# Patient Record
Sex: Female | Born: 1975
Health system: Southern US, Community
[De-identification: ages and names within clinical notes are randomized; demographics above are authoritative.]

## PROBLEM LIST (undated history)

## (undated) DIAGNOSIS — Z87442 Personal history of urinary calculi: Secondary | ICD-10-CM

## (undated) DIAGNOSIS — F4310 Post-traumatic stress disorder, unspecified: Secondary | ICD-10-CM

## (undated) DIAGNOSIS — G589 Mononeuropathy, unspecified: Secondary | ICD-10-CM

## (undated) DIAGNOSIS — F329 Major depressive disorder, single episode, unspecified: Secondary | ICD-10-CM

## (undated) DIAGNOSIS — F32A Depression, unspecified: Secondary | ICD-10-CM

## (undated) DIAGNOSIS — K219 Gastro-esophageal reflux disease without esophagitis: Secondary | ICD-10-CM

## (undated) DIAGNOSIS — M25559 Pain in unspecified hip: Secondary | ICD-10-CM

## (undated) DIAGNOSIS — M549 Dorsalgia, unspecified: Secondary | ICD-10-CM

## (undated) DIAGNOSIS — G8929 Other chronic pain: Secondary | ICD-10-CM

## (undated) DIAGNOSIS — F419 Anxiety disorder, unspecified: Secondary | ICD-10-CM

## (undated) HISTORY — DX: Gastro-esophageal reflux disease without esophagitis: K21.9

## (undated) HISTORY — DX: Pain in unspecified hip: M25.559

## (undated) HISTORY — PX: FOOT SURGERY: SHX648

## (undated) HISTORY — PX: MOUTH SURGERY: SHX715

## (undated) HISTORY — DX: Other chronic pain: G89.29

## (undated) HISTORY — PX: DILATION AND CURETTAGE OF UTERUS: SHX78

## (undated) HISTORY — DX: Anxiety disorder, unspecified: F41.9

## (undated) HISTORY — DX: Dorsalgia, unspecified: M54.9

---

## 1999-01-30 HISTORY — PX: FOOT SURGERY: SHX648

## 2002-01-05 ENCOUNTER — Ambulatory Visit (HOSPITAL_COMMUNITY): Admission: RE | Admit: 2002-01-05 | Discharge: 2002-01-05 | Payer: Self-pay | Admitting: Obstetrics and Gynecology

## 2002-01-05 ENCOUNTER — Encounter: Payer: Self-pay | Admitting: Obstetrics and Gynecology

## 2002-01-29 HISTORY — PX: DILATION AND CURETTAGE OF UTERUS: SHX78

## 2002-10-07 ENCOUNTER — Other Ambulatory Visit: Admission: RE | Admit: 2002-10-07 | Discharge: 2002-10-07 | Payer: Self-pay | Admitting: Obstetrics and Gynecology

## 2003-03-25 ENCOUNTER — Ambulatory Visit (HOSPITAL_COMMUNITY): Admission: RE | Admit: 2003-03-25 | Discharge: 2003-03-25 | Payer: Self-pay | Admitting: Obstetrics and Gynecology

## 2003-04-01 ENCOUNTER — Inpatient Hospital Stay (HOSPITAL_COMMUNITY): Admission: AD | Admit: 2003-04-01 | Discharge: 2003-04-04 | Payer: Self-pay | Admitting: Obstetrics and Gynecology

## 2003-04-02 ENCOUNTER — Encounter (INDEPENDENT_AMBULATORY_CARE_PROVIDER_SITE_OTHER): Payer: Self-pay | Admitting: Specialist

## 2003-04-05 ENCOUNTER — Encounter: Admission: RE | Admit: 2003-04-05 | Discharge: 2003-05-05 | Payer: Self-pay | Admitting: Obstetrics and Gynecology

## 2003-05-06 ENCOUNTER — Encounter: Admission: RE | Admit: 2003-05-06 | Discharge: 2003-06-05 | Payer: Self-pay | Admitting: Obstetrics and Gynecology

## 2003-07-22 ENCOUNTER — Other Ambulatory Visit: Admission: RE | Admit: 2003-07-22 | Discharge: 2003-07-22 | Payer: Self-pay | Admitting: Obstetrics and Gynecology

## 2004-07-25 ENCOUNTER — Other Ambulatory Visit: Admission: RE | Admit: 2004-07-25 | Discharge: 2004-07-25 | Payer: Self-pay | Admitting: Obstetrics and Gynecology

## 2005-01-30 ENCOUNTER — Inpatient Hospital Stay (HOSPITAL_COMMUNITY): Admission: AD | Admit: 2005-01-30 | Discharge: 2005-01-31 | Payer: Self-pay | Admitting: Obstetrics and Gynecology

## 2005-09-03 ENCOUNTER — Other Ambulatory Visit: Admission: RE | Admit: 2005-09-03 | Discharge: 2005-09-03 | Payer: Self-pay | Admitting: Obstetrics and Gynecology

## 2006-08-19 ENCOUNTER — Ambulatory Visit (HOSPITAL_COMMUNITY): Admission: RE | Admit: 2006-08-19 | Discharge: 2006-08-19 | Payer: Self-pay | Admitting: Obstetrics and Gynecology

## 2007-04-08 ENCOUNTER — Inpatient Hospital Stay (HOSPITAL_COMMUNITY): Admission: AD | Admit: 2007-04-08 | Discharge: 2007-04-09 | Payer: Self-pay | Admitting: Obstetrics and Gynecology

## 2010-06-16 NOTE — Discharge Summary (Signed)
[name], [name]              ACCOUNT NO.:  [id number]   MEDICAL RECORD NO.:  [id number]          PATIENT TYPE:  INP   LOCATION:  9121                          FACILITY:  [facility]   PHYSICIAN:  Malachi Pro. Ambrose Mantle, M.D. DATE OF BIRTH:  1975/03/23   DATE OF ADMISSION:  01/30/2005  DATE OF DISCHARGE:  01/31/2005                                 DISCHARGE SUMMARY   A 35 year old white married female, para 1-0-2-1, gravida 4, [facility] February 15, 2005 admitted ready for delivery.  Blood group and type A positive, negative  antibody, nonreactive serology, rubella immune, hepatitis B surface antigen  negative, HIV declined, GC and chlamydia negative, triple screen declined,  one-hour Glucola 109.  The patient had a vaginal ultrasound on July 13, 2004.  A crown-rump length of 2.33 cm, 9 weeks, zero days.  [facility] February 15, 2005.  She took Effexor, Lexapro and Zofran in early pregnancy because of a  history of IUGR.  Ultrasound was done on January 03, 2005.  Estimated fetal  weight 21st percentile, normal amniotic fluid.  The patient came to  Maternity Admission Unit fully dilated.  The nurse called the nurse midwife  to attend.  I was called and arrived in no more than nine minutes.  The baby  had been delivered and the placenta was out.  The patient had no known  allergies.   OPERATIONS:  In 2001 bilateral bunionectomies, 2003 and 1997 D&C.   ILLNESSES:  Anxiety, low-grade SIL 2004.  Had cryotherapy.   FAMILY HISTORY:  Maternal grandfather with high blood pressure and MI.   OBSTETRIC HISTORY:  In 1997 and 2003 spontaneously abortions, March of 2005  a 4 pound 15 ounce female at 37 weeks.  Delivery was vaginally with vacuum  assistance.   On admission, the patient's vital signs were normal.  Heart and lungs were  normal.  The abdomen showed a recently postpartum uterus.  Pelvic was  deferred because the patient had just been delivered by the certified nurse  midwife.  The patient delivered LOA  female infant with Apgars of 8 at one  and 9 at five minutes, weight of 6 pounds 1 ounce.  Blood loss less than 250  mL estimated by the nurse midwife.  Postpartum, the patient did well and was  discharged on the first postpartum day.  Her hemoglobin on admission was  12.9, hematocrit 37.1, white count 15,700, platelet count 285,000.  Follow-  up hemoglobin 12.2.  RPR was nonreactive.   FINAL DIAGNOSES:  1.  Intrauterine pregnancy at 37+ weeks.  Delivered left occiput anterior in      the Maternity Admission Unit.  2.  Operations, spontaneously delivery, left occiput anterior.   FINAL CONDITION:  Improved.   INSTRUCTIONS:  Include our regular discharge instruction booklet.  Patient  is given Celexa 20 mg to take one a day, and return to the office in six  weeks for follow-up examination.      Malachi Pro. Ambrose Mantle, M.D.  Electronically Signed     TFH/MEDQ  D:  01/31/2005  T:  01/31/2005  Job:  [id number] 

## 2010-06-16 NOTE — Discharge Summary (Signed)
[name], [name]              ACCOUNT NO.:  [id number]   MEDICAL RECORD NO.:  [id number]          PATIENT TYPE:  INP   LOCATION:  9146                          [facility]ACILITY:  [facility]   PHYSICIAN:  Zenaida Niece, M.D.DATE OF BIRTH:  1975-07-30   DATE OF ADMISSION:  04/08/2007  DATE OF DISCHARGE:  04/09/2007                               DISCHARGE SUMMARY   ADMISSION DIAGNOSIS:  Intrauterine pregnancy at 38 weeks.   DISCHARGE DIAGNOSIS:  Intrauterine pregnancy at 38 weeks.   PROCEDURES:  On March 10, she had a spontaneous vaginal delivery.   HISTORY AND PHYSICAL:  This is a 35 year old white female gravida 5,  para 2-0-2-2 with an EGA of 38+ weeks who presents with complaint of  regular contractions.  Evaluation in triage revealed regular  contractions and she was 6 cm dilated.  Prenatal care essentially  uncomplicated except that she was started on Celexa for anxiety.  Prenatal labs blood type is A+ with negative antibody screen, RPR  nonreactive, rubella immune, hepatitis B surface antigen negative, HIV  negative, gonorrhea and chlamydia negative, 1-hour Glucola 137, and  group B strep is negative.   PAST OBSTETRICAL HISTORY:  Two spontaneous abortions and then in 2005  vaginal delivery at 37 weeks, 4 pounds 15 ounces with intrauterine  growth restriction, and 2007 precipitous vaginal delivery at 37 weeks, 6  pounds 1 ounce.   GYN HISTORY:  Cryotherapy in 1997 and colposcopy in 2005.   PAST MEDICAL HISTORY:  Anxiety.   SURGICAL HISTORY:  Bunions and D and C.   MEDICATIONS:  Celexa.   PHYSICAL EXAMINATION:  GENERAL:  She is afebrile with stable vital  signs.  HEART:  Fetal heart tracing is reassuring with some variable  decelerations with contractions every 2-3 minutes.  ABDOMEN:  Gravid, nontender with an estimated fetal weight of 7-1/2  pounds.  PELVIC:  Cervix is 7-8, 90, -2, vertex presentation, and membranes  rupture revealing clear fluid.   HOSPITAL COURSE:  The  patient was admitted and continued to contract on  her own.  Membranes were ruptured and she continued to progress.  On the  morning of March 10, she had a vaginal delivery of a viable female infant  with Apgars of 7 and 9 that weighed 7 pounds 2 ounces.  A loose nuchal  cord x1 was reduced.  Placenta delivered spontaneous was intact.  She  had a first-degree vaginal and periurethral/periclitoral lacerations  repaired with 3-0 Vicryl with local block.  Estimated blood loss was  less than 500 mL.  Her IV came out during delivery, so she was given IM  Pitocin.  Postpartum, she had no significant complications.  On  postpartum day #1, she requested discharge home.  This was approved  since the baby could go home and she was discharged home.   DISCHARGE INSTRUCTIONS:  Regular diet, pelvic rest, and follow up in 6  weeks.   MEDICATIONS:  Over-the-counter ibuprofen as needed.  She is also to  continue her Celexa 20 mg.  She has been taking a quarter of one, I have  recommended that she take  at least half of one every day, and she is  given our discharge pamphlet.      Zenaida Niece, M.D.  Electronically Signed     TDM/MEDQ  D:  05/04/2007  T:  05/04/2007  Job:  [id number]

## 2010-06-16 NOTE — Discharge Summary (Signed)
[name], [name]                          ACCOUNT NO.:  [id number]   MEDICAL RECORD NO.:  [id number]                   PATIENT TYPE:  INP   LOCATION:  9145                                 [facility]ILITY[facility]  [facility]   PHYSICIAN:  Huel Cote, M.D.              DATE OF BIRTH:  05-18-1975   DATE OF ADMISSION:  04/01/2003  DATE OF DISCHARGE:  04/04/2003                                 DISCHARGE SUMMARY   DISCHARGE DIAGNOSES:  1. Term pregnancy at 37-3/7 weeks.  2. Intrauterine growth restriction.  3. Status post normal spontaneous vaginal delivery.  4. Prenatal care complicated by anxiety and mild depression.   DISCHARGE MEDICATIONS:  1. Motrin 600 mg p.o. every 6 hours p.r.n.  2. Percocet 1 to 2 tablets p.o. every 4 hours p.r.n.   HISTORY OF PRESENT ILLNESS:  The patient is a 35 year old G3, P0-0-2-0 who  is admitted at 37-3/7 weeks for induction of labor given an IUGR fetus noted  by ultrasound. The patient was found to be size less than dates on March 22, 2003, and followup ultrasound revealed an estimated fetal weight that  was in the 4th to 10th percentile. AFI and Doppler's were normal. The  patient had been followed by twice weekly NST's given that her cervix was  unfavorable. Prenatal course was also complicated by significant anxiety and  depression. The patient was on Effexor and was changed to Zoloft after she  had nausea and vomiting and restarted just prior to delivery. The patient  still had an essentially unfavorable cervix. However, due to her anxiety and  panic attacks over the welfare of the baby, desired trial of induction with  cervical ripening.   PRENATAL LABORATORY DATA:  A+, antibody negative, RPR non-reactive, Rubella  immune, hepatitis B surface antigen negative, HIV declined, GC negative,  Chlamydia negative, triple screen negative, group B strep negative.   PAST OBSTETRIC HISTORY:  The patient had two spontaneous miscarriages.   PAST GYNECOLOGIC  HISTORY:  Low-grade pap smear with negative colposcopy and  a repeat pap due postpartum.   PAST MEDICAL HISTORY:  Anxiety as stated.   PAST SURGICAL HISTORY:  Bunionectomy and a D&C x2.   ALLERGIES:  None.   MEDICATIONS:  Zoloft 50 mg p.o. q.h.s.   PHYSICAL EXAMINATION:  VITAL SIGNS:  On admission, she was afebrile with  stable vital signs. Fetal heart rate was reactive.  CARDIAC:  Regular rate and rhythm.  LUNGS:  Clear.  ABDOMEN:  Soft and nontender. Fundal height is 32 cm.  GENITALIA:  Pelvic was 50, closed, and a -3.   HOSPITAL COURSE:  The patient was admitted and placed on Cervidil ripening  for her initial 12 hours and actually had significant contractions and  discomfort with Cervidil in place, receiving Stadol x3 doses. Upon removal  of the Cervidil, the patient's cervix was re-examined and found to be 70, 1+  and a -2 and had  rupture of membranes performed with slightly blood-tinged  fluid obtained. Fetal heart rate was overall reassuring with some decreased  variability from the Stadol at that point. She then progressed very well  throughout the morning and reached complete dilatation within 5 hours. She  pushed approximately 2 hours with the descent of the fetal vertex to a +2  station, LOA. At this point, the patient began to get increasingly  uncomfortable and made minimal additional progress of the fetal vertex with  the last 30 minutes of pushing. Secondary to fatigue and decreased effort,  it was felt that she was no longer making significant progress. Furthermore  the fetal heart rate was reassuring.  However, began to have some variable  decelerations with slower recovery and it was felt best to proceed with a  vaginal assisted delivery. A vacuum assisted vaginal delivery was performed  with Mighty-Vac vacuum and the vertex delivered in one easy pull with normal  traction. Vacuum was removed with vertex crowning. Maternal effort pushed  the remainder of the body  out. The baby was a vigorous female infant.  Apgar's were 8 and 9. Weight was 4 pounds 15 ounces. Initially, she was  quite vigorous and had no problems. However, after about 10 minutes, the  baby was taken to the warmer with decreased tone and appeared to have an  episode of apnea without stimulation. The NICU team was called in to assess  the baby and the baby responded to stimulation and blow-by oxygen. The NICU  staff felt that the baby probably should be taken to the NICU for  observation. The patient had a small first-degree laceration which was  repaired with 2-0 Vicryl for hemostasis. The estimated blood loss was 350  cc. The patient did very well postpartum and on postpartum day 2 her pain  was well controlled. She was pumping her colostrum and having no other  complications. She was afebrile with stable vital signs. Fundus was firm.  Had been replaced on her Zoloft q.h.s. and was felt stable for discharge  home. The baby was still in the NICU; however, was doing quite well and was  to begin feeding the day of discharge.                                               Huel Cote, M.D.    KR/MEDQ  D:  04/04/2003  T:  04/04/2003  Job:  [id number]

## 2010-10-23 LAB — CBC
HCT: 39.5
Hemoglobin: 13.4
MCHC: 33.8
MCV: 93.9
Platelets: 291
RBC: 4.21
RDW: 16.5 — ABNORMAL HIGH
WBC: 15.8 — ABNORMAL HIGH

## 2010-10-23 LAB — RPR: RPR Ser Ql: NONREACTIVE

## 2015-04-26 ENCOUNTER — Emergency Department (HOSPITAL_BASED_OUTPATIENT_CLINIC_OR_DEPARTMENT_OTHER)
Admission: EM | Admit: 2015-04-26 | Discharge: 2015-04-26 | Disposition: A | Discharge status: Home / Self Care | Payer: BLUE CROSS/BLUE SHIELD | Attending: Emergency Medicine | Admitting: Emergency Medicine

## 2015-04-26 ENCOUNTER — Encounter (HOSPITAL_BASED_OUTPATIENT_CLINIC_OR_DEPARTMENT_OTHER): Payer: Self-pay | Admitting: *Deleted

## 2015-04-26 DIAGNOSIS — M545 Low back pain, unspecified: Secondary | ICD-10-CM

## 2015-04-26 DIAGNOSIS — S3992XA Unspecified injury of lower back, initial encounter: Secondary | ICD-10-CM | POA: Insufficient documentation

## 2015-04-26 DIAGNOSIS — Y9389 Activity, other specified: Secondary | ICD-10-CM | POA: Diagnosis not present

## 2015-04-26 DIAGNOSIS — Y998 Other external cause status: Secondary | ICD-10-CM | POA: Diagnosis not present

## 2015-04-26 DIAGNOSIS — F329 Major depressive disorder, single episode, unspecified: Secondary | ICD-10-CM | POA: Diagnosis not present

## 2015-04-26 DIAGNOSIS — Y92410 Unspecified street and highway as the place of occurrence of the external cause: Secondary | ICD-10-CM | POA: Insufficient documentation

## 2015-04-26 HISTORY — DX: Major depressive disorder, single episode, unspecified: F32.9

## 2015-04-26 HISTORY — DX: Depression, unspecified: F32.A

## 2015-04-26 NOTE — Discharge Instructions (Signed)
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
## 2015-04-26 NOTE — ED Notes (Signed)
Pt verbalizes understanding of d/c instructions and denies any further need at this time.
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
## 2015-11-08 ENCOUNTER — Encounter: Payer: Self-pay | Admitting: Neurology

## 2015-11-08 ENCOUNTER — Ambulatory Visit (INDEPENDENT_AMBULATORY_CARE_PROVIDER_SITE_OTHER): Payer: BLUE CROSS/BLUE SHIELD | Admitting: Neurology

## 2015-11-08 DIAGNOSIS — M5442 Lumbago with sciatica, left side: Secondary | ICD-10-CM

## 2015-11-08 DIAGNOSIS — G8929 Other chronic pain: Secondary | ICD-10-CM

## 2015-11-08 DIAGNOSIS — M5441 Lumbago with sciatica, right side: Secondary | ICD-10-CM | POA: Diagnosis not present

## 2015-11-08 DIAGNOSIS — M545 Low back pain, unspecified: Secondary | ICD-10-CM | POA: Insufficient documentation

## 2015-11-08 MED ORDER — CYCLOBENZAPRINE HCL 5 MG PO TABS: 5.0000 mg | ORAL_TABLET | Freq: Three times a day (TID) | ORAL | 6 refills | Status: DC | PRN

## 2015-11-08 MED ORDER — CELECOXIB 100 MG PO CAPS: 100.0000 mg | ORAL_CAPSULE | Freq: Two times a day (BID) | ORAL | 11 refills | Status: DC
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
## 2015-11-22 ENCOUNTER — Telehealth: Payer: Self-pay | Admitting: Neurology

## 2015-11-22 NOTE — Telephone Encounter (Signed)
Pt's husband called to schedule MRI. He can be reached at [phone]

## 2015-11-22 NOTE — Telephone Encounter (Signed)
[employer] did not approve the lumbar spine wo contrast. They were wanting to know if you were considering having the patient do back sugrey or a epidural. I informed the [place] [place] nurse that you were ruling out  lumbar stenosis and couldn't make a treatment plan until you see the images information. They request a peer to peer. The phone number is [phone]. Need the patient name, member ID which is [id number] and her DOB is 1975-03-29.

## 2015-11-22 NOTE — Telephone Encounter (Signed)
X-ray of lumbar in August 2017 showed no significant abnormality.  MRI lumbar spine, [id number]

## 2015-11-22 NOTE — Telephone Encounter (Signed)
Spoke with [name] his wife wants a larger unit I told him that I will send the order over to [place] and get the Auth from the insurance. I also gave him their phone number & address.

## 2015-12-01 ENCOUNTER — Telehealth: Payer: Self-pay | Admitting: Neurology

## 2015-12-01 NOTE — Telephone Encounter (Signed)
Pt's husband called in wanting to know if pt has been approved for MRI. She is scheduled for 11/12. I did not see any notes with the order. Please call [phone] may leave message

## 2015-12-01 NOTE — Telephone Encounter (Signed)
Spoke with [name] and informed her she has been approved until 12/21/15 with the auth number is [id number] and she understood.

## 2015-12-03 ENCOUNTER — Other Ambulatory Visit: Payer: BLUE CROSS/BLUE SHIELD

## 2015-12-11 ENCOUNTER — Ambulatory Visit
Admission: RE | Admit: 2015-12-11 | Discharge: 2015-12-11 | Disposition: A | Discharge status: Home / Self Care | Payer: BLUE CROSS/BLUE SHIELD | Source: Ambulatory Visit | Attending: Neurology | Admitting: Neurology

## 2015-12-11 DIAGNOSIS — M5441 Lumbago with sciatica, right side: Principal | ICD-10-CM

## 2015-12-11 DIAGNOSIS — M5442 Lumbago with sciatica, left side: Principal | ICD-10-CM

## 2015-12-11 DIAGNOSIS — G8929 Other chronic pain: Secondary | ICD-10-CM

## 2015-12-12 ENCOUNTER — Telehealth: Payer: Self-pay | Admitting: Neurology

## 2015-12-12 NOTE — Telephone Encounter (Signed)
Spoke to patient - she is aware of results and will keep her NCV/EMG appt on 12/30/15.

## 2015-12-12 NOTE — Telephone Encounter (Signed)
Please call patient, MRI of the lumbar showed mild degenerative disc diseasesignificant foraminal canal stenosis  IMPRESSION:  This MRI of the lumbar spine without contrast shows the following: 1.   At L1-L2, there is mild central disc bulging. There is no nerve root compression. 2.   At L4-L5, there is a small central disc protrusion and a joint effusion within the right facet joint. The right lateral recess is minimally narrowed. There is no nerve root compression. 3.   At L5-S1, there is central disc bulging and right greater than left facet hypertrophy. No nerve root compression.

## 2015-12-30 ENCOUNTER — Ambulatory Visit (INDEPENDENT_AMBULATORY_CARE_PROVIDER_SITE_OTHER): Payer: BLUE CROSS/BLUE SHIELD | Admitting: Neurology

## 2015-12-30 ENCOUNTER — Encounter (INDEPENDENT_AMBULATORY_CARE_PROVIDER_SITE_OTHER): Payer: Self-pay | Admitting: Neurology

## 2015-12-30 DIAGNOSIS — M5441 Lumbago with sciatica, right side: Secondary | ICD-10-CM

## 2015-12-30 DIAGNOSIS — G8929 Other chronic pain: Secondary | ICD-10-CM

## 2015-12-30 DIAGNOSIS — M5442 Lumbago with sciatica, left side: Secondary | ICD-10-CM

## 2015-12-30 DIAGNOSIS — Z0289 Encounter for other administrative examinations: Secondary | ICD-10-CM

## 2015-12-30 DIAGNOSIS — R202 Paresthesia of skin: Secondary | ICD-10-CM | POA: Diagnosis not present

## 2015-12-30 MED ORDER — MELOXICAM 15 MG PO TABS: 15.0000 mg | ORAL_TABLET | Freq: Every day | ORAL | 11 refills | Status: DC
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
## 2015-12-30 NOTE — Procedures (Signed)
Full Name: Kambrie Eddleman Gender: Female MRN #: [id number] Date of Birth: 06-16-1975    Visit Date: 12/30/2015 10:00 Age: 40 Years 65 Months Old Examining Physician: Marcial Pacas, MD  Referring Physician: Krista Blue History: 40 years old female, with history of chronic low back pain, radiating pain to bilateral lower extremity    Summary of the test:  Nerve conduction study: Bilateral superficial peroneal, and sural sensory responses were normal.  Bilateral peroneal to EDB motor responses showed mildly decreased C map amplitude. Left peroneal to tibialis anterior motor responses were normal. Bilateral tibial motor responses were normal.  Electromyography: Selective needle examination of bilateral lower extremity muscles and lumbar sacral paraspinal muscles were normal.  Conclusion: This is a slight abnormal study, there is no electrodiagnostic evidence of large fiber peripheral neuropathy of bilateral lumbosacral radiculopathy. The decreased C map amplitude at bilateral peroneal to EDB motor responses can be consistent with mild atrophy of bilateral extensor digitorum brevis, suggestive of distal deep branch peroneal neuropathy, but there was no evidence of slowing across the left fibular head.    ------------------------------- Marcial Pacas, M.D.  [facility] [place], [place] [address] Tel: [phone] Fax: [phone]        [facility]    Nerve / Sites Rec. Site Peak Lat Ref.  Amp Ref. Segments Distance    ms ms V V  cm  L Superficial peroneal - Ankle     Lat leg Ankle 3.5 ?4.4 10 ?6 Lat leg - Ankle 14  L Sural - Ankle (Calf)     Calf Ankle 2.8 ?4.4 28 ?6 Calf - Ankle 14  R Superficial peroneal - Ankle     Lat leg Ankle 4.0 ?4.4 5 ?6 Lat leg - Ankle 14  R Sural - Ankle (Calf)     Calf Ankle 4.0 ?4.4 11 ?6 Calf - Ankle 14             [facility]    Nerve / Sites Muscle Latency Ref. Amplitude Ref. Rel Amp Segments Distance Lat Diff Velocity Ref. Area    ms  ms mV mV %  cm ms m/s m/s mVms  L Peroneal - EDB     Ankle EDB 8.0 ?6.5 1.1 ?2.0 100 Ankle - EDB 9    2.9     Fib head EDB 13.6  1.0  92.3 Fib head - Ankle 28 5.6 50 ?44 3.3     Pop fossa EDB 15.9  0.6  56.4 Pop fossa - Fib head 9 2.3 38 ?44 2.0         Pop fossa - Ankle  8.0     L Tibial - AH     Ankle AH 3.1 ?5.8 16.5 ?4.0 100 Ankle - AH 9    38.3     Pop fossa AH 11.3  14.8  89.3 Pop fossa - Ankle 35 8.2 43 ?41 34.2  R Tibial - AH     Ankle AH 4.7 ?5.8 23.4 ?4.0 100 Ankle - AH 9    43.3     Pop fossa AH 11.6  20.6  88.2 Pop fossa - Ankle 33 6.9 48 ?41 42.5  R Peroneal - EDB     Ankle EDB 10.5 ?6.5 0.8 ?2.0 100 Ankle - EDB 9    2.3     Fib head EDB 19.4  0.4  48.9 Fib head - Ankle 38 8.9 43 ?44 0.8     Pop fossa EDB NR  NR  NR Pop fossa - Fib head  NR  ?44 NR         Pop fossa - Ankle  NR     L Peroneal - Tib Ant     Fib Head Tib Ant 2.6 ?6.7 5.9 ?3.0 100 Fib Head - Tib Ant 10    44.1     Pop fossa Tib Ant 4.3  6.9  116 Pop fossa - Fib Head 10 1.7 58 ?44 50.0               F  Wave    Nerve F Lat Ref.   ms ms  L Peroneal - EDB NR ?56.0  L Tibial - AH 46.5 ?56.0  R Tibial - AH 46.1 ?56.0  R Peroneal - EDB 0.2 ?56.0             H Reflex    Nerve H Lat Lat Hmax   ms ms   Left Right Ref. Left Right Ref.  Tibial - [place] 28.0 27.5 ?35.0 27.9 30.7 ?35.0         EMG       EMG Summary Table    Spontaneous MUAP Recruitment  Muscle IA Fib PSW Fasc Other Amp Dur. Poly Pattern  L. Tibialis anterior Normal None None None _______ Normal Normal Normal Normal  L. Peroneus longus Normal None None None _______ Normal Normal Normal Normal  L. Vastus lateralis Normal None None None _______ Normal Normal Normal Normal  R. Vastus lateralis Normal None None None _______ Normal Normal Normal Normal  R. Tibialis anterior Normal None None None _______ Normal Normal Normal Normal  R. Peroneus longus Normal None None None _______ Normal Normal Normal Normal  R. Gastrocnemius (Medial head) Normal  None None None _______ Normal Normal Normal Normal  L. Lumbar paraspinals (mid) Normal None None None _______ Normal Normal Normal Normal  L. Lumbar paraspinals (low) Normal None None None _______ Normal Normal Normal Normal  R. Lumbar paraspinals (mid) Normal None None None _______ Normal Normal Normal Normal  R. Lumbar paraspinals (low) Normal None None None _______ Normal Normal Normal Normal

## 2016-01-10 ENCOUNTER — Ambulatory Visit: Payer: BLUE CROSS/BLUE SHIELD | Attending: Neurology | Admitting: Physical Therapy

## 2016-01-10 DIAGNOSIS — G8929 Other chronic pain: Secondary | ICD-10-CM | POA: Diagnosis present

## 2016-01-10 DIAGNOSIS — M6281 Muscle weakness (generalized): Secondary | ICD-10-CM | POA: Insufficient documentation

## 2016-01-10 DIAGNOSIS — R262 Difficulty in walking, not elsewhere classified: Secondary | ICD-10-CM | POA: Insufficient documentation

## 2016-01-10 DIAGNOSIS — M5441 Lumbago with sciatica, right side: Secondary | ICD-10-CM | POA: Diagnosis present

## 2016-01-10 DIAGNOSIS — M5442 Lumbago with sciatica, left side: Secondary | ICD-10-CM | POA: Diagnosis present
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
## 2016-01-17 ENCOUNTER — Ambulatory Visit: Payer: BLUE CROSS/BLUE SHIELD

## 2016-01-17 DIAGNOSIS — G8929 Other chronic pain: Secondary | ICD-10-CM

## 2016-01-17 DIAGNOSIS — M5442 Lumbago with sciatica, left side: Principal | ICD-10-CM

## 2016-01-17 DIAGNOSIS — M5441 Lumbago with sciatica, right side: Principal | ICD-10-CM

## 2016-01-17 DIAGNOSIS — M6281 Muscle weakness (generalized): Secondary | ICD-10-CM

## 2016-01-17 DIAGNOSIS — R262 Difficulty in walking, not elsewhere classified: Secondary | ICD-10-CM
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
## 2016-01-26 ENCOUNTER — Ambulatory Visit: Payer: BLUE CROSS/BLUE SHIELD | Admitting: Physical Therapy

## 2016-02-02 ENCOUNTER — Ambulatory Visit: Payer: BLUE CROSS/BLUE SHIELD | Attending: Neurology | Admitting: Physical Therapy

## 2016-02-02 DIAGNOSIS — M6281 Muscle weakness (generalized): Secondary | ICD-10-CM

## 2016-02-02 DIAGNOSIS — R262 Difficulty in walking, not elsewhere classified: Secondary | ICD-10-CM | POA: Diagnosis present

## 2016-02-02 DIAGNOSIS — M5441 Lumbago with sciatica, right side: Secondary | ICD-10-CM | POA: Diagnosis present

## 2016-02-02 DIAGNOSIS — G8929 Other chronic pain: Secondary | ICD-10-CM | POA: Diagnosis present

## 2016-02-02 DIAGNOSIS — M5442 Lumbago with sciatica, left side: Secondary | ICD-10-CM | POA: Insufficient documentation
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
## 2016-02-02 NOTE — Patient Instructions (Addendum)
Sleeping on Back  Place pillow under knees. A pillow with cervical support and a roll around waist are also helpful. Copyright  VHI. All rights reserved.  Sleeping on Side Place pillow between knees. Use cervical support under neck and a roll around waist as needed. Copyright  VHI. All rights reserved.   Sleeping on Stomach   If this is the only desirable sleeping position, place pillow under lower legs, and under stomach or chest as needed.  Posture - Sitting   Sit upright, head facing forward. Try using a roll to support lower back. Keep shoulders relaxed, and avoid rounded back. Keep hips level with knees. Avoid crossing legs for long periods. Stand to Sit / Sit to Stand   To sit: Bend knees to lower self onto front edge of chair, then scoot back on seat. To stand: Reverse sequence by placing one foot forward, and scoot to front of seat. Use rocking motion to stand up.   Work Height and Reach  Ideal work height is no more than 2 to 4 inches below elbow level when standing, and at elbow level when sitting. Reaching should be limited to arm's length, with elbows slightly bent.  Bending  Bend at hips and knees, not back. Keep feet shoulder-width apart.    Posture - Standing   Good posture is important. Avoid slouching and forward head thrust. Maintain curve in low back and align ears over shoul- ders, hips over ankles.  Alternating Positions   Alternate tasks and change positions frequently to reduce fatigue and muscle tension. Take rest breaks. Computer Work   Position work to face forward. Use proper work and seat height. Keep shoulders back and down, wrists straight, and elbows at right angles. Use chair that provides full back support. Add footrest and lumbar roll as needed.  Getting Into / Out of Car  Lower self onto seat, scoot back, then bring in one leg at a time. Reverse sequence to get out.  Dressing  Lie on back to pull socks or slacks over feet, or sit  and bend leg while keeping back straight.    Housework - Sink  Place one foot on ledge of cabinet under sink when standing at sink for prolonged periods.   Pushing / Pulling  Pushing is preferable to pulling. Keep back in proper alignment, and use leg muscles to do the work.  Deep Squat   Squat and lift with both arms held against upper trunk. Tighten stomach muscles without holding breath. Use smooth movements to avoid jerking.  Avoid Twisting   Avoid twisting or bending back. Pivot around using foot movements, and bend at knees if needed when reaching for articles.  Carrying Luggage   Distribute weight evenly on both sides. Use a cart whenever possible. Do not twist trunk. Move body as a unit.   Lifting Principles .Maintain proper posture and head alignment. .Slide object as close as possible before lifting. .Move obstacles out of the way. .Test before lifting; ask for help if too heavy. .Tighten stomach muscles without holding breath. .Use smooth movements; do not jerk. .Use legs to do the work, and pivot with feet. .Distribute the work load symmetrically and close to the center of trunk. .Push instead of pull whenever possible.   Ask For Help   Ask for help and delegate to others when possible. Coordinate your movements when lifting together, and maintain the low back curve.  Log Roll   Lying on back, bend left knee and place left   arm across chest. Roll all in one movement to the right. Reverse to roll to the left. Always move as one unit. Housework - Sweeping  Use long-handled equipment to avoid stooping.   Housework - Wiping  Position yourself as close as possible to reach work surface. Avoid straining your back.  Laundry - Unloading Wash   To unload small items at bottom of washer, lift leg opposite to arm being used to reach.  [place] close to area to be raked. Use arm movements to do the work. Keep back straight and avoid  twisting.     Cart  When reaching into cart with one arm, lift opposite leg to keep back straight.   Getting Into / Out of Bed  Lower self to lie down on one side by raising legs and lowering head at the same time. Use arms to assist moving without twisting. Bend both knees to roll onto back if desired. To sit up, start from lying on side, and use same move-ments in reverse. Housework - Vacuuming  Hold the vacuum with arm held at side. Step back and forth to move it, keeping head up. Avoid twisting.   Laundry - IT consultant so that bending and twisting can be avoided.   Laundry - Unloading Dryer  Squat down to reach into clothes dryer or use a reacher.  Gardening - Weeding / Probation officer or Kneel. Knee pads may be helpful.                   TENS stands for Transcutaneous Electrical Nerve Stimulation. In other words, electrical impulses are allowed to pass through the skin in order to excite a nerve.   Purpose and Use of TENS:  TENS is a method used to manage acute and chronic pain without the use of drugs. It has been effective in managing pain associated with surgery, sprains, strains, trauma, rheumatoid arthritis, and neuralgias. It is a non-addictive, low risk, and non-invasive technique used to control pain. It is not, by any means, a curative form of treatment.   How TENS Works:  Most TENS units are a Paramedic unit powered by one 9 volt battery. Attached to the outside of the unit are two lead wires where two pins and/or snaps connect on each wire. All units come with a set of four reusable pads or electrodes. These are placed on the skin surrounding the area involved. By inserting the leads into  the pads, the electricity can pass from the unit making the circuit complete.  As the intensity is turned up slowly, the electrical current enters the body from the electrodes through the skin to the surrounding nerve fibers. This  triggers the release of hormones from within the body. These hormones contain pain relievers. By increasing the circulation of these hormones, the person's pain may be lessened. It is also believed that the electrical stimulation itself helps to block the pain messages being sent to the brain, thus also decreasing the body's perception of pain.   Hazards:  TENS units are NOT to be used by patients with PACEMAKERS, DEFIBRILLATORS, DIABETIC PUMPS, PREGNANT WOMEN, and patients with SEIZURE DISORDERS.  TENS units are NOT to be used over the heart, throat, brain, or spinal cord.  One of the major side effects from the TENS unit may be skin irritation. Some people may develop a rash if they are sensitive to the materials used in the electrodes or the  connecting wires.   Wear the unit for 30 minutes at a time.   Avoid overuse due the body getting used to the stem making it not as effective over time.   TENS UNIT  This is helpful for muscle pain and spasm.   Search and Purchase a TENS 7000 2nd edition at www.tenspros.com or www.amazon.com  (It should be less than $30)     TENS unit instructions:   Do not shower or bathe with the unit on  Turn the unit off before removing electrodes or batteries  If the electrodes lose stickiness add a drop of water to the electrodes after they are disconnected from the unit and place on plastic sheet. If you continued to have difficulty, call the TENS unit company to purchase more electrodes.  Do not apply lotion on the skin area prior to use. Make sure the skin is clean and dry as this will help prolong the life of the electrodes.  After use, always check skin for unusual red areas, rash or other skin difficulties. If there are any skin problems, does not apply electrodes to the same area.  Never remove the electrodes from the unit by pulling the wires.  Do not use the TENS unit or electrodes other than as directed.  Do not change electrode placement  without consulting your therapist or physician.  Keep 2 fingers with between each electrode.

## 2016-02-07 ENCOUNTER — Ambulatory Visit: Payer: BLUE CROSS/BLUE SHIELD | Admitting: Physical Therapy

## 2016-02-07 DIAGNOSIS — M6281 Muscle weakness (generalized): Secondary | ICD-10-CM

## 2016-02-07 DIAGNOSIS — M5442 Lumbago with sciatica, left side: Secondary | ICD-10-CM | POA: Diagnosis not present

## 2016-02-07 DIAGNOSIS — M5441 Lumbago with sciatica, right side: Principal | ICD-10-CM

## 2016-02-07 DIAGNOSIS — R262 Difficulty in walking, not elsewhere classified: Secondary | ICD-10-CM

## 2016-02-07 DIAGNOSIS — G8929 Other chronic pain: Secondary | ICD-10-CM
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
## 2016-02-15 ENCOUNTER — Ambulatory Visit: Payer: BLUE CROSS/BLUE SHIELD

## 2016-02-22 ENCOUNTER — Ambulatory Visit: Payer: BLUE CROSS/BLUE SHIELD

## 2016-03-08 ENCOUNTER — Telehealth: Payer: Self-pay | Admitting: Neurology

## 2016-03-08 ENCOUNTER — Other Ambulatory Visit: Payer: Self-pay | Admitting: *Deleted

## 2016-03-08 DIAGNOSIS — G8929 Other chronic pain: Secondary | ICD-10-CM

## 2016-03-08 DIAGNOSIS — M5442 Lumbago with sciatica, left side: Principal | ICD-10-CM

## 2016-03-08 DIAGNOSIS — M5441 Lumbago with sciatica, right side: Principal | ICD-10-CM

## 2016-03-08 NOTE — Telephone Encounter (Signed)
Returned call - states they discussed pain management at last appt and pt would like to move forward with the referral.  Requested referral be sent to [facility] in [facility] [phone]).  Order placed in [place].

## 2016-03-08 NOTE — Telephone Encounter (Signed)
Pt's husband called said Dr Krista Blue suggested pt see  pain management specialist but has not rec'd a call. He can be reached at [phone] x [phone]

## 2016-03-13 ENCOUNTER — Telehealth: Payer: Self-pay | Admitting: Neurology

## 2016-03-13 NOTE — Telephone Encounter (Signed)
[facility] Telephone [phone] - fax [phone] . Not in Net work with [employer]. Left patient a message to return my call.

## 2016-03-15 NOTE — Telephone Encounter (Signed)
Called and spoke to patient she is going to check with her insurance. Patient will call me back next week and tell me where she would like to go for ([facility]). Thanks Hinton Dyer.

## 2016-03-19 NOTE — Telephone Encounter (Signed)
Spoke to Patient's husband and he requested to be sent to [facility]. Referral has been sent.

## 2016-08-27 NOTE — Progress Notes (Deleted)
[name] [place], [place] [address] Phone: [phone] Subjective:    I'm seeing this patient by the request  of:    CC: back pain.   [id number]  [name] is a 41 y.o. female coming in with complaint of back pain   Onset-  Location Duration-  Character- Aggravating factors- Reliving factors-  Therapies tried-  Severity-  Previous mri 12/12/15   IMPRESSION:  This MRI of the lumbar spine without contrast shows the following: 1.   At L1-L2, there is mild central disc bulging. There is no nerve root compression. 2.   At L4-L5, there is a small central disc protrusion and a joint effusion within the right facet joint. The right lateral recess is minimally narrowed. There is no nerve root compression. 3.   At L5-S1, there is central disc bulging and right greater than left facet hypertrophy. No nerve root compression.      Past Medical History:  Diagnosis Date  . Anxiety   . Back pain   . Depression    Past Surgical History:  Procedure Laterality Date  . FOOT SURGERY     Social History   Social History  . Marital status: Married    Spouse name: N/A  . Number of children: 3  . Years of education: 2 years of college   Occupational History  . Homemaker    Social History Main Topics  . Smoking status: Never Smoker  . Smokeless tobacco: Never Used  . Alcohol use Yes     Comment: Occasionally  . Drug use: No  . Sexual activity: Not on file   Other Topics Concern  . Not on file   Social History Narrative   Lives at home with her husband and children.   Right-handed.   1 cup caffeine day.   Allergies  Allergen Reactions  . Meloxicam Nausea And Vomiting   Family History  Problem Relation Age of Onset  . Hyperlipidemia Mother   . Depression Mother   . Anxiety disorder Mother   . Neuropathy Mother   . Fibromyalgia Mother   . Heart disease Father      Past medical history, social, surgical and  family history all reviewed in electronic medical record.  No pertanent information unless stated regarding to the chief complaint.   Review of Systems:Review of systems updated and as accurate as of 08/27/16  No headache, visual changes, nausea, vomiting, diarrhea, constipation, dizziness, abdominal pain, skin rash, fevers, chills, night sweats, weight loss, swollen lymph nodes, body aches, joint swelling, muscle aches, chest pain, shortness of breath, mood changes.   Objective  There were no vitals taken for this visit. Systems examined below as of 08/27/16   General: No apparent distress alert and oriented x3 mood and affect normal, dressed appropriately.  HEENT: Pupils equal, extraocular movements intact  Respiratory: Patient's speak in full sentences and does not appear short of breath  Cardiovascular: No lower extremity edema, non tender, no erythema  Skin: Warm dry intact with no signs of infection or rash on extremities or on axial skeleton.  Abdomen: Soft nontender  Neuro: Cranial nerves II through XII are intact, neurovascularly intact in all extremities with 2+ DTRs and 2+ pulses.  Lymph: No lymphadenopathy of posterior or anterior cervical chain or axillae bilaterally.  Gait normal with good balance and coordination.  MSK:  Non tender with full range of motion and good stability and symmetric strength and tone of shoulders, elbows,  wrist, hip, knee and ankles bilaterally.      Impression and Recommendations:     This case required medical decision making of moderate complexity.      Note: This dictation was prepared with Dragon dictation along with smaller phrase technology. Any transcriptional errors that result from this process are unintentional.

## 2016-08-29 ENCOUNTER — Ambulatory Visit: Payer: BLUE CROSS/BLUE SHIELD | Admitting: Family Medicine

## 2016-09-12 ENCOUNTER — Encounter: Payer: Self-pay | Admitting: Family Medicine

## 2016-09-12 ENCOUNTER — Ambulatory Visit (INDEPENDENT_AMBULATORY_CARE_PROVIDER_SITE_OTHER): Payer: BLUE CROSS/BLUE SHIELD | Admitting: Family Medicine

## 2016-09-12 DIAGNOSIS — M5441 Lumbago with sciatica, right side: Secondary | ICD-10-CM

## 2016-09-12 DIAGNOSIS — M5442 Lumbago with sciatica, left side: Secondary | ICD-10-CM

## 2016-09-12 DIAGNOSIS — M999 Biomechanical lesion, unspecified: Secondary | ICD-10-CM | POA: Insufficient documentation

## 2016-09-12 DIAGNOSIS — G8929 Other chronic pain: Secondary | ICD-10-CM

## 2016-09-12 MED ORDER — VITAMIN D (ERGOCALCIFEROL) 1.25 MG (50000 UNIT) PO CAPS: 50000.0000 [IU] | ORAL_CAPSULE | ORAL | 0 refills | Status: DC
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
## 2016-09-12 NOTE — Assessment & Plan Note (Signed)
Decision today to treat with OMT was based on Physical Exam  After verbal consent patient was treated with HVLA, ME, FPR techniques in cervical, thoracic, lumbar and sacral areas  Patient tolerated the procedure well with improvement in symptoms  Patient given exercises, stretches and lifestyle modifications  See medications in patient instructions if given  Patient will follow up in 4 weeks

## 2016-09-12 NOTE — Assessment & Plan Note (Signed)
I believe the patient's low back pain is secondary to her hip plication significant tendinitis. We discussed icing regimen and home exercises. We discussed which activities to do in which ones to avoid. We discussed topical anti-inflammatories, once weekly vitamin D for muscle strength and endurance. Encourage her to continue the other medications at this time. Attempted osteopathic manipulation. Home exercises given to her and reviewed with athletic trainer. Follow-up again in 4-6 weeks

## 2016-09-12 NOTE — Patient Instructions (Addendum)
Good to see you.  Ice 20 minutes 2 times daily. Usually after activity and before bed. Exercises 3 times a week.  Ok to walk but stretch after Once weekly vitamin D for 12 weeks.  Turmeric 544m twice daily  Tart cherry extract an dose at night Try pennsaid pinkie amount topically 2 times daily as needed.   See me again in 3-4 weeks.

## 2016-10-03 ENCOUNTER — Encounter: Payer: Self-pay | Admitting: Family Medicine

## 2016-10-03 ENCOUNTER — Ambulatory Visit (INDEPENDENT_AMBULATORY_CARE_PROVIDER_SITE_OTHER): Payer: BLUE CROSS/BLUE SHIELD | Admitting: Family Medicine

## 2016-10-03 VITALS — BP 110/80 | HR 82 | Ht 62.5 in | Wt 166.0 lb

## 2016-10-03 DIAGNOSIS — M5442 Lumbago with sciatica, left side: Secondary | ICD-10-CM

## 2016-10-03 DIAGNOSIS — M5441 Lumbago with sciatica, right side: Secondary | ICD-10-CM | POA: Diagnosis not present

## 2016-10-03 DIAGNOSIS — M999 Biomechanical lesion, unspecified: Secondary | ICD-10-CM

## 2016-10-03 DIAGNOSIS — G8929 Other chronic pain: Secondary | ICD-10-CM | POA: Diagnosis not present

## 2016-10-03 NOTE — Assessment & Plan Note (Addendum)
Decision today to treat with OMT was based on Physical Exam  After verbal consent patient was treated with HVLA, ME, FPR techniques in , thoracic, lumbar and sacral areas  Patient tolerated the procedure well with improvement in symptoms  Patient given exercises, stretches and lifestyle modifications  See medications in patient instructions if given  Patient will follow up in 6-7 weeks

## 2016-10-03 NOTE — Assessment & Plan Note (Signed)
Patient does have low back pain. We discussed patient at great length. She has been making significant progress. We discussed the importance of core strengthening patient will continue on the medications that she has taken this time. No true side effects. No significant changes otherwise. Patient will follow-up with me again in 6 weeks for further evaluation and treatment.

## 2016-10-03 NOTE — Progress Notes (Signed)
[name] [place], [place] [address] Phone: [phone] Subjective:    I'm seeing this patient by the request  of:    CC: Low back pain follow-up.  [id number]  Sharyn Shaw is a 41 y.o. female coming in with complaint of low back pain. Patient was seen by me and had more of a musculoskeletal complaint of the low back. Started with home exercises and patient states that she is making progress. Patient states that there is not as much tightness. Not as much pain at night either. Patient states that this is been the best she is slept quite some time.      Past Medical History:  Diagnosis Date  . Anxiety   . Back pain   . Depression    Past Surgical History:  Procedure Laterality Date  . FOOT SURGERY     Social History   Social History  . Marital status: Married    Spouse name: N/A  . Number of children: 3  . Years of education: 2 years of college   Occupational History  . Homemaker    Social History Main Topics  . Smoking status: Never Smoker  . Smokeless tobacco: Never Used  . Alcohol use Yes     Comment: Occasionally  . Drug use: No  . Sexual activity: Not Asked   Other Topics Concern  . None   Social History Narrative   Lives at home with her husband and children.   Right-handed.   1 cup caffeine day.   Allergies  Allergen Reactions  . Meloxicam Nausea And Vomiting   Family History  Problem Relation Age of Onset  . Hyperlipidemia Mother   . Depression Mother   . Anxiety disorder Mother   . Neuropathy Mother   . Fibromyalgia Mother   . Heart disease Father      Past medical history, social, surgical and family history all reviewed in electronic medical record.  No pertanent information unless stated regarding to the chief complaint.   Review of Systems:Review of systems updated and as accurate as of 10/03/16  No headache, visual changes, nausea, vomiting, diarrhea, constipation, dizziness,  abdominal pain, skin rash, fevers, chills, night sweats, weight loss, swollen lymph nodes, body aches, joint swelling, chest pain, shortness of breath, mood changes. Positive muscle aches  Objective  Blood pressure 110/80, pulse 82, height 5' 2.5" (1.588 m), weight 166 lb (75.3 kg), SpO2 97 %. Systems examined below as of 10/03/16   General: No apparent distress alert and oriented x3 mood and affect normal, dressed appropriately.  HEENT: Pupils equal, extraocular movements intact  Respiratory: Patient's speak in full sentences and does not appear short of breath  Cardiovascular: No lower extremity edema, non tender, no erythema  Skin: Warm dry intact with no signs of infection or rash on extremities or on axial skeleton.  Abdomen: Soft nontender  Neuro: Cranial nerves II through XII are intact, neurovascularly intact in all extremities with 2+ DTRs and 2+ pulses.  Lymph: No lymphadenopathy of posterior or anterior cervical chain or axillae bilaterally.  Gait normal with good balance and coordination.  MSK:  Non tender with full range of motion and good stability and symmetric strength and tone of shoulders, elbows, wrist, hip, knee and ankles bilaterally.  Back Exam:  Inspection: Unremarkable  Motion: Flexion 45 deg, Extension 30 deg, Side Bending to 45 deg bilaterally,  Rotation to 45 deg bilaterally  SLR laying: Negative  XSLR laying: Negative  Palpable tenderness: Mild tenderness in the thoracolumbar juncture. Some pain in the cervical spine as well. Pain on the right side of the medial aspect of the scapula. FABER: negative. Sensory change: Gross sensation intact to all lumbar and sacral dermatomes.  Reflexes: 2+ at both patellar tendons, 2+ at achilles tendons, Babinski's downgoing.  Strength at foot  Plantar-flexion: 5/5 Dorsi-flexion: 5/5 Eversion: 5/5 Inversion: 5/5  Leg strength  Quad: 5/5 Hamstring: 5/5 Hip flexor: 5/5 Hip abductors:4/5 but symmetric Gait  unremarkable.  Osteopathic findings  T3 extended rotated and side bent right inhaled third rib T9 extended rotated and side bent left L2 flexed rotated and side bent right Sacrum right on right      Impression and Recommendations:     This case required medical decision making of moderate complexity.      Note: This dictation was prepared with Dragon dictation along with smaller phrase technology. Any transcriptional errors that result from this process are unintentional.

## 2016-10-03 NOTE — Patient Instructions (Signed)
Good to see you  I am glad we are making progress This will be slow and will take time but I think you will do great.  Continue the exercises and the vitamins prilosec 2 times a day for 1 week could help a lot..  See me again in 6 weeks.

## 2016-11-14 ENCOUNTER — Ambulatory Visit: Payer: BLUE CROSS/BLUE SHIELD | Admitting: Family Medicine

## 2016-11-22 ENCOUNTER — Ambulatory Visit (INDEPENDENT_AMBULATORY_CARE_PROVIDER_SITE_OTHER): Payer: BLUE CROSS/BLUE SHIELD | Admitting: Family Medicine

## 2016-11-22 ENCOUNTER — Encounter: Payer: Self-pay | Admitting: Family Medicine

## 2016-11-22 VITALS — BP 110/70 | HR 91 | Ht 62.0 in

## 2016-11-22 DIAGNOSIS — M5441 Lumbago with sciatica, right side: Secondary | ICD-10-CM

## 2016-11-22 DIAGNOSIS — G8929 Other chronic pain: Secondary | ICD-10-CM | POA: Diagnosis not present

## 2016-11-22 DIAGNOSIS — M999 Biomechanical lesion, unspecified: Secondary | ICD-10-CM

## 2016-11-22 DIAGNOSIS — M5442 Lumbago with sciatica, left side: Secondary | ICD-10-CM

## 2016-11-22 NOTE — Assessment & Plan Note (Signed)
Decision today to treat with OMT was based on Physical Exam  After verbal consent patient was treated with HVLA, ME, FPR techniques in cervical, thoracic, lumbar and sacral areas  Patient tolerated the procedure well with improvement in symptoms  Patient given exercises, stretches and lifestyle modifications  See medications in patient instructions if given  Patient will follow up in 4-8 weeks

## 2016-11-22 NOTE — Patient Instructions (Signed)
Good to see you  [name] is your friend.  Stay active.  Write me if anything changes.  Continue the vitamin D then switch to 20000 IU dialy thereafter See me again in 4-6 weeks.

## 2016-11-22 NOTE — Progress Notes (Signed)
[name] [place], [place] [address] Phone: [phone] Subjective:    I'm seeing this patient by the request  of:    CC: neck and back pain follow-up  [id number]  [name] is a 41 y.o. female coming in with complaint of Neck and back pain follow-up. Patient had been doing relatively well. States that not have the radicular pain as much. Still having some tightness. Last week seems to be worsening again. Starting even affecting daily activities such as going up and down stairs.      Past Medical History:  Diagnosis Date  . Anxiety   . Back pain   . Depression    Past Surgical History:  Procedure Laterality Date  . FOOT SURGERY     Social History   Social History  . Marital status: Married    Spouse name: N/A  . Number of children: 3  . Years of education: 2 years of college   Occupational History  . Homemaker    Social History Main Topics  . Smoking status: Never Smoker  . Smokeless tobacco: Never Used  . Alcohol use Yes     Comment: Occasionally  . Drug use: No  . Sexual activity: Not on file   Other Topics Concern  . Not on file   Social History Narrative   Lives at home with her husband and children.   Right-handed.   1 cup caffeine day.   Allergies  Allergen Reactions  . Meloxicam Nausea And Vomiting   Family History  Problem Relation Age of Onset  . Hyperlipidemia Mother   . Depression Mother   . Anxiety disorder Mother   . Neuropathy Mother   . Fibromyalgia Mother   . Heart disease Father      Past medical history, social, surgical and family history all reviewed in electronic medical record.  No pertanent information unless stated regarding to the chief complaint.   Review of Systems:Review of systems updated and as accurate as of 11/22/16  No headache, visual changes, nausea, vomiting, diarrhea, constipation, dizziness, abdominal pain, skin rash, fevers, chills, night sweats, weight  loss, swollen lymph nodes, body aches, joint swelling, muscle aches   Objective  There were no vitals taken for this visit. Systems examined below as of 11/22/16   General: No apparent distress alert and oriented x3 mood and affect normal, dressed appropriately.  HEENT: Pupils equal, extraocular movements intact  Respiratory: Patient's speak in full sentences and does not appear short of breath  Cardiovascular: No lower extremity edema, non tender, no erythema  Skin: Warm dry intact with no signs of infection or rash on extremities or on axial skeleton.  Abdomen: Soft nontender  Neuro: Cranial nerves II through XII are intact, neurovascularly intact in all extremities with 2+ DTRs and 2+ pulses.  Lymph: No lymphadenopathy of posterior or anterior cervical chain or axillae bilaterally.  Gait normal with good balance and coordination.  MSK:  Non tender with full range of motion and good stability and symmetric strength and tone of shoulders, elbows, wrist, hip, knee and ankles bilaterally.  Back Exam:  Inspection: Mild loss of lordosis Motion: Flexion 41 deg, Extension 25 deg, Side Bending to 35 deg bilaterally,  Rotation to 35 deg bilaterally  SLR laying: Negative  XSLR laying: Negative  Palpable tenderness: Tender to palpation in the per spinal musculature.Marland Kitchen FABER: negative. Sensory change: Gross sensation intact to all lumbar and sacral dermatomes.  Reflexes: 2+ at both patellar  tendons, 2+ at achilles tendons, Babinski's downgoing.  Strength at foot  Plantar-flexion: 5/5 Dorsi-flexion: 5/5 Eversion: 5/5 Inversion: 5/5  Leg strength  Quad: 5/5 Hamstring: 5/5 Hip flexor: 5/5 Hip abductors: 5/5  Gait unremarkable.   Osteopathic findings T3 extended rotated and side bent right inhaled third rib T7 extended rotated and side bent left L3 flexed rotated and side bent right Sacrum right on right    Impression and Recommendations:     This case required medical decision making of  moderate complexity.      Note: This dictation was prepared with Dragon dictation along with smaller phrase technology. Any transcriptional errors that result from this process are unintentional.

## 2016-11-22 NOTE — Assessment & Plan Note (Signed)
Discussed low back pain. Patient will continue with once weekly vitamin D. We discussed if we need any other type of medications as going up on the Effexor. Patient declined at this moment. We discussed icing regimen. Home exercises have been going relatively well but patient needs to be more compliant. Follow-up again in 4-8 weeks.

## 2016-12-04 ENCOUNTER — Other Ambulatory Visit: Payer: Self-pay | Admitting: Family Medicine

## 2016-12-04 NOTE — Telephone Encounter (Signed)
Refill denied. Pt has completed course of treatment.

## 2016-12-25 ENCOUNTER — Ambulatory Visit: Payer: BLUE CROSS/BLUE SHIELD | Admitting: Family Medicine

## 2016-12-31 ENCOUNTER — Ambulatory Visit: Payer: BLUE CROSS/BLUE SHIELD | Admitting: Family Medicine

## 2016-12-31 ENCOUNTER — Encounter: Payer: Self-pay | Admitting: Family Medicine

## 2016-12-31 VITALS — BP 110/82 | HR 95 | Ht 62.5 in | Wt 165.0 lb

## 2016-12-31 DIAGNOSIS — G8929 Other chronic pain: Secondary | ICD-10-CM | POA: Diagnosis not present

## 2016-12-31 DIAGNOSIS — M5441 Lumbago with sciatica, right side: Secondary | ICD-10-CM | POA: Diagnosis not present

## 2016-12-31 DIAGNOSIS — M5442 Lumbago with sciatica, left side: Secondary | ICD-10-CM | POA: Diagnosis not present

## 2016-12-31 DIAGNOSIS — M999 Biomechanical lesion, unspecified: Secondary | ICD-10-CM

## 2016-12-31 NOTE — Assessment & Plan Note (Signed)
Patient does have some low back pain.  We discussed that a lot of this is likely secondary to more tightness and muscle imbalances still.  Patient is taking gabapentin fairly regularly.  We discussed the Effexor on a more regular basis as well.  We discussed home exercises and icing regimen.  Patient will try to make these changes come back and see me again 4-6 weeks

## 2016-12-31 NOTE — Patient Instructions (Signed)
Good to see you  [name] is your friend.  Stay active Try to do the exercises 3-4 times a week if you can  Biking may be a good alternative to walking sometimes  See me again in 4 weeks

## 2016-12-31 NOTE — Assessment & Plan Note (Signed)
Decision today to treat with OMT was based on Physical Exam  After verbal consent patient was treated with HVLA, ME, FPR techniques in cervical, thoracic, lumbar and sacral areas  Patient tolerated the procedure well with improvement in symptoms  Patient given exercises, stretches and lifestyle modifications  See medications in patient instructions if given  Patient will follow up in 4-6 weeks

## 2016-12-31 NOTE — Progress Notes (Signed)
[name] [address] [place], [place] [address] Phone: [phone] Subjective:     CC: Low back pain follow-up  [id number]  [name] is a 41 y.o. female coming in with complaint of low back pain.  Has been seen several times.  Responded very well to osteopathic manipulation.  Continued vitamin D supplementation and home exercises.  Patient states that she has been doing well.       Past Medical History:  Diagnosis Date  . Anxiety   . Back pain   . Depression    Past Surgical History:  Procedure Laterality Date  . FOOT SURGERY     Social History   Socioeconomic History  . Marital status: Married    Spouse name: None  . Number of children: 3  . Years of education: 2 years of college  . Highest education level: None  Social Needs  . Financial resource strain: None  . Food insecurity - worry: None  . Food insecurity - inability: None  . Transportation needs - medical: None  . Transportation needs - non-medical: None  Occupational History  . Occupation: Homemaker  Tobacco Use  . Smoking status: Never Smoker  . Smokeless tobacco: Never Used  Substance and Sexual Activity  . Alcohol use: Yes    Comment: Occasionally  . Drug use: No  . Sexual activity: None  Other Topics Concern  . None  Social History Narrative   Lives at home with her husband and children.   Right-handed.   1 cup caffeine day.   Allergies  Allergen Reactions  . Meloxicam Nausea And Vomiting   Family History  Problem Relation Age of Onset  . Hyperlipidemia Mother   . Depression Mother   . Anxiety disorder Mother   . Neuropathy Mother   . Fibromyalgia Mother   . Heart disease Father      Past medical history, social, surgical and family history all reviewed in electronic medical record.  No pertanent information unless stated regarding to the chief complaint.   Review of Systems:Review of systems updated and as accurate as of 12/31/16  No  headache, visual changes, nausea, vomiting, diarrhea, constipation, dizziness, abdominal pain, skin rash, fevers, chills, night sweats, weight loss, swollen lymph nodes, body aches, joint swelling,s, chest pain, shortness of breath, mood changes.   Positive Muscle aches  Objective  Blood pressure 110/82, pulse 95, height 5' 2.5" (1.588 m), weight 165 lb (74.8 kg), SpO2 98 %. Systems examined below as of 12/31/16   General: No apparent distress alert and oriented x3 mood and affect normal, dressed appropriately.  HEENT: Pupils equal, extraocular movements intact  Respiratory: Patient's speak in full sentences and does not appear short of breath  Cardiovascular: No lower extremity edema, non tender, no erythema  Skin: Warm dry intact with no signs of infection or rash on extremities or on axial skeleton.  Abdomen: Soft nontender  Neuro: Cranial nerves II through XII are intact, neurovascularly intact in all extremities with 2+ DTRs and 2+ pulses.  Lymph: No lymphadenopathy of posterior or anterior cervical chain or axillae bilaterally.  Gait normal with good balance and coordination.  MSK:  Non tender with full range of motion and good stability and symmetric strength and tone of shoulders, elbows, wrist, hip, knee and ankles bilaterally.  Neck: Inspection mild tightness noted in the thoracolumbar juncture as well as in the cervical thoracic juncture. No palpable stepoffs. Negative Spurling's maneuver. Mild loss of motion and sidebending bilaterally  Grip strength and sensation normal in bilateral hands Strength good C4 to T1 distribution No sensory change to C4 to T1 Negative Hoffman sign bilaterally Reflexes normal  Low back shows some mild tightness in the left sacroiliac joint  Osteopathic findings C2 flexed rotated and side bent right C4 flexed rotated and side bent left T3 extended rotated and side bent right inhaled third rib T7 extended rotated and side bent left L2 flexed  rotated and side bent right Sacrum right on right     Impression and Recommendations:     This case required medical decision making of moderate complexity.      Note: This dictation was prepared with Dragon dictation along with smaller phrase technology. Any transcriptional errors that result from this process are unintentional.

## 2017-01-27 NOTE — Progress Notes (Signed)
[name] [address] [place], [place] [address] Phone: [phone] Subjective:     CC: Low back pain  [id number]  [name] is a 41 y.o. female coming in with complaint of low back pain.  Seems to be mostly musculoskeletal.  Patient is on a low-dose of Effexor as well as once weekly vitamin D.  Patient is doing home exercises and responding very well to osteopathic manipulation.  Patient states doing fairly well overall.  We discussed icing regimen and.  We discussed which activities of doing which wants to avoid which patient has been doing fairly regularly.  Has not started working out on a regular basis though.  Some increasing stress recently but nothing severe.    Past Medical History:  Diagnosis Date  . Anxiety   . Back pain   . Depression    Past Surgical History:  Procedure Laterality Date  . FOOT SURGERY     Social History   Socioeconomic History  . Marital status: Married    Spouse name: None  . Number of children: 3  . Years of education: 2 years of college  . Highest education level: None  Social Needs  . Financial resource strain: None  . Food insecurity - worry: None  . Food insecurity - inability: None  . Transportation needs - medical: None  . Transportation needs - non-medical: None  Occupational History  . Occupation: Homemaker  Tobacco Use  . Smoking status: Never Smoker  . Smokeless tobacco: Never Used  Substance and Sexual Activity  . Alcohol use: Yes    Comment: Occasionally  . Drug use: No  . Sexual activity: None  Other Topics Concern  . None  Social History Narrative   Lives at home with her husband and children.   Right-handed.   1 cup caffeine day.   Allergies  Allergen Reactions  . Meloxicam Nausea And Vomiting   Family History  Problem Relation Age of Onset  . Hyperlipidemia Mother   . Depression Mother   . Anxiety disorder Mother   . Neuropathy Mother   . Fibromyalgia Mother   .  Heart disease Father      Past medical history, social, surgical and family history all reviewed in electronic medical record.  No pertanent information unless stated regarding to the chief complaint.   Review of Systems:Review of systems updated and as accurate as of 01/28/17  No headache, visual changes, nausea, vomiting, diarrhea, constipation, dizziness, abdominal pain, skin rash, fevers, chills, night sweats, weight loss, swollen lymph nodes, body aches, joint swelling, muscle aches, chest pain, shortness of breath, mood changes.   Objective  Blood pressure 120/80, pulse 97, height 5' 2"  (1.575 m), SpO2 97 %. Systems examined below as of 01/28/17   General: No apparent distress alert and oriented x3 mood and affect normal, dressed appropriately.  HEENT: Pupils equal, extraocular movements intact  Respiratory: Patient's speak in full sentences and does not appear short of breath  Cardiovascular: No lower extremity edema, non tender, no erythema  Skin: Warm dry intact with no signs of infection or rash on extremities or on axial skeleton.  Abdomen: Soft nontender  Neuro: Cranial nerves II through XII are intact, neurovascularly intact in all extremities with 2+ DTRs and 2+ pulses.  Lymph: No lymphadenopathy of posterior or anterior cervical chain or axillae bilaterally.  Gait normal with good balance and coordination.  MSK:  Non tender with full range of motion and good stability and symmetric  strength and tone of shoulders, elbows, wrist, hip, knee and ankles bilaterally.   Back Exam:  Inspection: Lordosis Motion: Flexion 45 deg, Extension 25 deg, Side Bending to 35 deg bilaterally,  Rotation to 45 deg bilaterally  SLR laying: Negative  XSLR laying: Negative  Palpable tenderness: Tender to palpation of the paraspinal musculature right greater than left more than the thoracolumbar juncture. FABER: Tightness on the right. Sensory change: Gross sensation intact to all lumbar and  sacral dermatomes.  Reflexes: 2+ at both patellar tendons, 2+ at achilles tendons, Babinski's downgoing.  Strength at foot  Plantar-flexion: 5/5 Dorsi-flexion: 5/5 Eversion: 5/5 Inversion: 5/5  Leg strength  Quad: 5/5 Hamstring: 5/5 Hip flexor: 5/5 Hip abductors: 5/5  Gait unremarkable.  Osteopathic findings C2 flexed rotated and side bent right C4 flexed rotated and side bent left C7 flexed rotated and side bent left T3 extended rotated and side bent right inhaled third rib T8 extended rotated and side bent left L2 flexed rotated and side bent right Sacrum right on right    Impression and Recommendations:     This case required medical decision making of moderate complexity.      Note: This dictation was prepared with Dragon dictation along with smaller phrase technology. Any transcriptional errors that result from this process are unintentional.

## 2017-01-28 ENCOUNTER — Encounter: Payer: Self-pay | Admitting: Family Medicine

## 2017-01-28 ENCOUNTER — Ambulatory Visit: Payer: BLUE CROSS/BLUE SHIELD | Admitting: Family Medicine

## 2017-01-28 VITALS — BP 120/80 | HR 97 | Ht 62.0 in

## 2017-01-28 DIAGNOSIS — G8929 Other chronic pain: Secondary | ICD-10-CM | POA: Diagnosis not present

## 2017-01-28 DIAGNOSIS — M5442 Lumbago with sciatica, left side: Secondary | ICD-10-CM

## 2017-01-28 DIAGNOSIS — M999 Biomechanical lesion, unspecified: Secondary | ICD-10-CM

## 2017-01-28 DIAGNOSIS — M5441 Lumbago with sciatica, right side: Secondary | ICD-10-CM

## 2017-01-28 MED ORDER — VENLAFAXINE HCL ER 37.5 MG PO CP24: 37.5000 mg | ORAL_CAPSULE | Freq: Every day | ORAL | 1 refills | Status: DC

## 2017-01-28 NOTE — Assessment & Plan Note (Signed)
Patient does have some low back pain but seems to be doing well.  Patient has done well with the manipulation.  Encouraged core strengthening.  Patient will start going to the gym on a more regular basis.  Patient will decrease the Effexor down to 37.5 mg.  Patient is also on the Wellbutrin.  Follow up again in 4-6 weeks

## 2017-01-28 NOTE — Assessment & Plan Note (Signed)
Decision today to treat with OMT was based on Physical Exam  After verbal consent patient was treated with HVLA, ME, FPR techniques in cervical, thoracic, lumbar and sacral areas  Patient tolerated the procedure well with improvement in symptoms  Patient given exercises, stretches and lifestyle modifications  See medications in patient instructions if given  Patient will follow up in 4-6 weeks

## 2017-01-28 NOTE — Patient Instructions (Signed)
Great to see you  You are doing great overall  Ice is your friend.  Stay active.  When you want drop down effexor to 37.5 mg daily for 2 weeks and see how you feel  Then go down to 3 times a week for 2 weeks then stop  Happy New Year!  See you again in 4-6 weeks

## 2017-01-29 DIAGNOSIS — G8929 Other chronic pain: Secondary | ICD-10-CM

## 2017-01-29 HISTORY — DX: Other chronic pain: G89.29

## 2017-02-26 ENCOUNTER — Ambulatory Visit: Payer: BLUE CROSS/BLUE SHIELD | Admitting: Family Medicine

## 2017-02-26 ENCOUNTER — Encounter: Payer: Self-pay | Admitting: Family Medicine

## 2017-02-26 VITALS — BP 100/72 | HR 94 | Ht 62.0 in | Wt 163.0 lb

## 2017-02-26 DIAGNOSIS — M5442 Lumbago with sciatica, left side: Secondary | ICD-10-CM | POA: Diagnosis not present

## 2017-02-26 DIAGNOSIS — M999 Biomechanical lesion, unspecified: Secondary | ICD-10-CM

## 2017-02-26 DIAGNOSIS — M5441 Lumbago with sciatica, right side: Secondary | ICD-10-CM

## 2017-02-26 DIAGNOSIS — G8929 Other chronic pain: Secondary | ICD-10-CM

## 2017-02-26 MED ORDER — VENLAFAXINE HCL ER 75 MG PO CP24: 75.0000 mg | ORAL_CAPSULE | Freq: Every day | ORAL | 0 refills | Status: DC

## 2017-02-26 NOTE — Patient Instructions (Signed)
Good to see you  We will try effexor 41m daily for 2 weeks then down to 37.5 mg for 2 weeks Continue all the other goodies I think you are making progress See me again in 4 weeks

## 2017-02-26 NOTE — Progress Notes (Signed)
[name] [address] [place], [place] [address] Phone: [phone] Subjective:    CC: Back pain and neck pain follow-up  [id number]  [name] is a 42 y.o. female coming in with complaint of neck and back pain.  Patient has had some underlying anxiety and depression is likely contributing.  Patient wants to be off the Effexor.  Feels like it is not helping as much.  Is on 150 mg at this time.  Was to titrate off but did not start.  Patient continues to have discomfort but nothing as severe as when she started.  Feels that the vitamins and the exercises have been helpful       Past Medical History:  Diagnosis Date  . Anxiety   . Back pain   . Depression    Past Surgical History:  Procedure Laterality Date  . FOOT SURGERY     Social History   Socioeconomic History  . Marital status: Married    Spouse name: None  . Number of children: 3  . Years of education: 2 years of college  . Highest education level: None  Social Needs  . Financial resource strain: None  . Food insecurity - worry: None  . Food insecurity - inability: None  . Transportation needs - medical: None  . Transportation needs - non-medical: None  Occupational History  . Occupation: Homemaker  Tobacco Use  . Smoking status: Never Smoker  . Smokeless tobacco: Never Used  Substance and Sexual Activity  . Alcohol use: Yes    Comment: Occasionally  . Drug use: No  . Sexual activity: None  Other Topics Concern  . None  Social History Narrative   Lives at home with her husband and children.   Right-handed.   1 cup caffeine day.   Allergies  Allergen Reactions  . Meloxicam Nausea And Vomiting   Family History  Problem Relation Age of Onset  . Hyperlipidemia Mother   . Depression Mother   . Anxiety disorder Mother   . Neuropathy Mother   . Fibromyalgia Mother   . Heart disease Father      Past medical history, social, surgical and family history all  reviewed in electronic medical record.  No pertanent information unless stated regarding to the chief complaint.   Review of Systems:Review of systems updated and as accurate as of 02/26/17  No , visual changes, nausea, vomiting, diarrhea, constipation, dizziness, abdominal pain, skin rash, fevers, chills, night sweats, weight loss, swollen lymph nodes, body aches, joint swelling, chest pain, shortness of breath, mood changes.  Positive headaches and muscle aches  Objective  Blood pressure 100/72, pulse 94, height 5' 2"  (1.575 m), weight 163 lb (73.9 kg), SpO2 97 %. Systems examined below as of 02/26/17   General: No apparent distress alert and oriented x3 mood and affect normal, dressed appropriately.  HEENT: Pupils equal, extraocular movements intact  Respiratory: Patient's speak in full sentences and does not appear short of breath  Cardiovascular: No lower extremity edema, non tender, no erythema  Skin: Warm dry intact with no signs of infection or rash on extremities or on axial skeleton.  Abdomen: Soft nontender  Neuro: Cranial nerves II through XII are intact, neurovascularly intact in all extremities with 2+ DTRs and 2+ pulses.  Lymph: No lymphadenopathy of posterior or anterior cervical chain or axillae bilaterally.  Gait normal with good balance and coordination.  MSK:  Non tender with full range of motion and good stability and  symmetric strength and tone of shoulders, elbows, wrist, hip, knee and ankles bilaterally.   Neck: Inspection mild loss of lordosis. No palpable stepoffs. Negative Spurling's maneuver. Full neck range of motion Grip strength and sensation normal in bilateral hands Strength good C4 to T1 distribution No sensory change to C4 to T1 Negative Hoffman sign bilaterally Reflexes normal Tightness of the trapezius bilateral.  Patient does have increasing discomfort in the thoracolumbar juncture in the paraspinal musculature.  No spinous process tenderness mild  scapular dyskinesis noted Low back shows a positive Corky Sox.  Negative straight leg test.  Some mild pain over the right sacroiliac joint.  Osteopathic findings C2 flexed rotated and side bent right C4 flexed rotated and side bent left C6 flexed rotated and side bent left T3 extended rotated and side bent left inhaled rib T5 extended rotated and side bent right  L2 flexed rotated and side bent right Sacrum right on right       Impression and Recommendations:     This case required medical decision making of moderate complexity.      Note: This dictation was prepared with Dragon dictation along with smaller phrase technology. Any transcriptional errors that result from this process are unintentional.

## 2017-02-26 NOTE — Assessment & Plan Note (Signed)
Decision today to treat with OMT was based on Physical Exam  After verbal consent patient was treated with HVLA, ME, FPR techniques in cervical, thoracic, lumbar and sacral areas  Patient tolerated the procedure well with improvement in symptoms  Patient given exercises, stretches and lifestyle modifications  See medications in patient instructions if given  Patient will follow up in 4 weeks

## 2017-02-26 NOTE — Assessment & Plan Note (Signed)
Stable overall.  Titrating off the Effexor and refilled 75 mg.  Patient does have 37.5 mg at home.  We discussed proper titration and wrote this down for patient.  Encourage patient to continue the vitamin D and home exercise.  Follow-up with me again in 4 weeks

## 2017-03-25 NOTE — Progress Notes (Signed)
[name] [place], [place] [address] Phone: [phone] Subjective:    I'm seeing this patient by the request  of:    CC: Low back pain  [id number]  [name] is a 42 y.o. female coming in with complaint of low back pain.  Patient has been doing relatively well for quite some time.  We did not titrate patient off of Effexor 2 months ago.  Patient states feels like she is doing well on Effexor at 37.5 mg.  Does not want to come off the medication at this point because she feels like she does better with it.  Also helping her pain.  Still has some tightness and some stiffness of the back but nothing severe.     Past Medical History:  Diagnosis Date  . Anxiety   . Back pain   . Depression    Past Surgical History:  Procedure Laterality Date  . FOOT SURGERY     Social History   Socioeconomic History  . Marital status: Married    Spouse name: None  . Number of children: 3  . Years of education: 2 years of college  . Highest education level: None  Social Needs  . Financial resource strain: None  . Food insecurity - worry: None  . Food insecurity - inability: None  . Transportation needs - medical: None  . Transportation needs - non-medical: None  Occupational History  . Occupation: Homemaker  Tobacco Use  . Smoking status: Never Smoker  . Smokeless tobacco: Never Used  Substance and Sexual Activity  . Alcohol use: Yes    Comment: Occasionally  . Drug use: No  . Sexual activity: None  Other Topics Concern  . None  Social History Narrative   Lives at home with her husband and children.   Right-handed.   1 cup caffeine day.   Allergies  Allergen Reactions  . Meloxicam Nausea And Vomiting   Family History  Problem Relation Age of Onset  . Hyperlipidemia Mother   . Depression Mother   . Anxiety disorder Mother   . Neuropathy Mother   . Fibromyalgia Mother   . Heart disease Father      Past medical  history, social, surgical and family history all reviewed in electronic medical record.  No pertanent information unless stated regarding to the chief complaint.   Review of Systems:Review of systems updated and as accurate as of 03/26/17  No headache, visual changes, nausea, vomiting, diarrhea, constipation, dizziness, abdominal pain, skin rash, fevers, chills, night sweats, weight loss, swollen lymph nodes, body aches, joint swelling, chest pain, shortness of breath, mood changes.  Positive muscle aches  Objective  Blood pressure 116/70, pulse 90, height 5' 2"  (1.575 m), weight 163 lb (73.9 kg), SpO2 98 %. Systems examined below as of 03/26/17   General: No apparent distress alert and oriented x3 mood and affect normal, dressed appropriately.  HEENT: Pupils equal, extraocular movements intact  Respiratory: Patient's speak in full sentences and does not appear short of breath  Cardiovascular: No lower extremity edema, non tender, no erythema  Skin: Warm dry intact with no signs of infection or rash on extremities or on axial skeleton.  Abdomen: Soft nontender  Neuro: Cranial nerves II through XII are intact, neurovascularly intact in all extremities with 2+ DTRs and 2+ pulses.  Lymph: No lymphadenopathy of posterior or anterior cervical chain or axillae bilaterally.  Gait normal with good balance and coordination.  MSK:  Non  tender with full range of motion and good stability and symmetric strength and tone of shoulders, elbows, wrist, hip, knee and ankles bilaterally.  Back Exam:  Inspection: Unremarkable  Motion: Flexion 35 deg, Extension 25 deg, Side Bending to 45 deg bilaterally,  Rotation to 45 deg bilaterally  SLR laying: Negative  XSLR laying: Negative  Palpable tenderness: Tender to palpation the paraspinal musculature lumbar spine right greater than left. FABER: Tightness on the right. Sensory change: Gross sensation intact to all lumbar and sacral dermatomes.  Reflexes: 2+ at  both patellar tendons, 2+ at achilles tendons, Babinski's downgoing.  Strength at foot  Plantar-flexion: 5/5 Dorsi-flexion: 5/5 Eversion: 5/5 Inversion: 5/5  Leg strength  Quad: 5/5 Hamstring: 5/5 Hip flexor: 5/5 Hip abductors: 5/5  Gait unremarkable.  Osteopathic findings C2 flexed rotated and side bent right C4 flexed rotated and side bent left C7 flexed rotated and side bent left T3 extended rotated and side bent right inhaled third rib T6 extended rotated and side bent left L3 flexed rotated and side bent right Sacrum right on right    Impression and Recommendations:     This case required medical decision making of moderate complexity.      Note: This dictation was prepared with Dragon dictation along with smaller phrase technology. Any transcriptional errors that result from this process are unintentional.

## 2017-03-26 ENCOUNTER — Encounter: Payer: Self-pay | Admitting: Family Medicine

## 2017-03-26 ENCOUNTER — Ambulatory Visit: Payer: BLUE CROSS/BLUE SHIELD | Admitting: Family Medicine

## 2017-03-26 VITALS — BP 116/70 | HR 90 | Ht 62.0 in | Wt 163.0 lb

## 2017-03-26 DIAGNOSIS — M999 Biomechanical lesion, unspecified: Secondary | ICD-10-CM | POA: Diagnosis not present

## 2017-03-26 DIAGNOSIS — M5442 Lumbago with sciatica, left side: Secondary | ICD-10-CM

## 2017-03-26 DIAGNOSIS — M5441 Lumbago with sciatica, right side: Secondary | ICD-10-CM | POA: Diagnosis not present

## 2017-03-26 DIAGNOSIS — G8929 Other chronic pain: Secondary | ICD-10-CM

## 2017-03-26 MED ORDER — VENLAFAXINE HCL ER 75 MG PO CP24: 75.0000 mg | ORAL_CAPSULE | Freq: Every day | ORAL | 1 refills | Status: DC

## 2017-03-26 MED ORDER — VENLAFAXINE HCL ER 37.5 MG PO CP24: 75.0000 mg | ORAL_CAPSULE | Freq: Every day | ORAL | 1 refills | Status: DC

## 2017-03-26 NOTE — Patient Instructions (Addendum)
Good to see you Sorry for the delay  Refilled effexor  See me again 6 weeks

## 2017-03-26 NOTE — Assessment & Plan Note (Signed)
Decision today to treat with OMT was based on Physical Exam  After verbal consent patient was treated with HVLA, ME, FPR techniques in cervical, thoracic, lumbar and sacral areas  Patient tolerated the procedure well with improvement in symptoms  Patient given exercises, stretches and lifestyle modifications  See medications in patient instructions if given  Patient will follow up in 6-8 weeks

## 2017-03-26 NOTE — Assessment & Plan Note (Signed)
Low back pain.  Discussed with patient at great length.  Responding well to osteopathic manipulation.  We will continue with the Effexor at 37 mg.  Encourage core strengthening including posture and ergonomics.  Follow-up again in 6-8 weeks.

## 2017-05-07 ENCOUNTER — Ambulatory Visit: Payer: BLUE CROSS/BLUE SHIELD | Admitting: Family Medicine

## 2017-05-25 NOTE — Progress Notes (Signed)
Alicia [name], [name] [address] Phone: [phone] Subjective:      CC: Back pain follow-up  [id number]  Alicia [name] is a 42 y.o. female coming in with complaint of back pain follow-up.  Has been doing very well.  Patient was to be titrated off of the Effexor but has found it difficult to continue the medications.  Patient has been doing very well with conservative therapy and has been 8 weeks since we have seen her.  Has responded to manipulation previously.  Patient states overall doing relatively well.    Past Medical History:  Diagnosis Date  . Anxiety   . Back pain   . Depression    Past Surgical History:  Procedure Laterality Date  . FOOT SURGERY     Social History   Socioeconomic History  . Marital status: Married    Spouse name: Not on file  . Number of children: 3  . Years of education: 2 years of college  . Highest education level: Not on file  Occupational History  . Occupation: Agricultural engineer  Social Needs  . Financial resource strain: Not on file  . Food insecurity:    Worry: Not on file    Inability: Not on file  . Transportation needs:    Medical: Not on file    Non-medical: Not on file  Tobacco Use  . Smoking status: Never Smoker  . Smokeless tobacco: Never Used  Substance and Sexual Activity  . Alcohol use: Yes    Comment: Occasionally  . Drug use: No  . Sexual activity: Not on file  Lifestyle  . Physical activity:    Days per week: Not on file    Minutes per session: Not on file  . Stress: Not on file  Relationships  . Social connections:    Talks on phone: Not on file    Gets together: Not on file    Attends religious service: Not on file    Active member of club or organization: Not on file    Attends meetings of clubs or organizations: Not on file    Relationship status: Not on file  Other Topics Concern  . Not on file  Social History Narrative   Lives at home with her husband and  children.   Right-handed.   1 cup caffeine day.   Allergies  Allergen Reactions  . Meloxicam Nausea And Vomiting   Family History  Problem Relation Age of Onset  . Hyperlipidemia Mother   . Depression Mother   . Anxiety disorder Mother   . Neuropathy Mother   . Fibromyalgia Mother   . Heart disease Father      Past medical history, social, surgical and family history all reviewed in electronic medical record.  No pertanent information unless stated regarding to the chief complaint.   Review of Systems:Review of systems updated and as accurate as of 05/27/17  No headache, visual changes, nausea, vomiting, diarrhea, constipation, dizziness, abdominal pain, skin rash, fevers, chills, night sweats, weight loss, swollen lymph nodes, body aches, joint swelling, , chest pain, shortness of breath, mood changes.  Positive muscle aches  Objective  Blood pressure 124/72, pulse 96, height 5' 2"  (1.575 m), weight 162 lb (73.5 kg), SpO2 98 %. Systems examined below as of 05/27/17   General: No apparent distress alert and oriented x3 mood and affect normal, dressed appropriately.  HEENT: Pupils equal, extraocular movements intact  Respiratory: Patient's speak in full sentences and  does not appear short of breath  Cardiovascular: No lower extremity edema, non tender, no erythema  Skin: Warm dry intact with no signs of infection or rash on extremities or on axial skeleton.  Abdomen: Soft nontender  Neuro: Cranial nerves II through XII are intact, neurovascularly intact in all extremities with 2+ DTRs and 2+ pulses.  Lymph: No lymphadenopathy of posterior or anterior cervical chain or axillae bilaterally.  Gait normal with good balance and coordination.  MSK:  Non tender with full range of motion and good stability and symmetric strength and tone of shoulders, elbows, wrist, hip, knee and ankles bilaterally.  Back Exam:  Inspection: Unremarkable  Motion: Flexion 35 deg, Extension 25 deg, Side  Bending to 35 deg bilaterally,  Rotation to 45 deg bilaterally  SLR laying: Negative  XSLR laying: Negative  Palpable tenderness: Tender to palpation of the paraspinal musculature.Marland Kitchen FABER: Positive Faber. Sensory change: Gross sensation intact to all lumbar and sacral dermatomes.  Reflexes: 2+ at both patellar tendons, 2+ at achilles tendons, Babinski's downgoing.  Strength at foot  Plantar-flexion: 5/5 Dorsi-flexion: 5/5 Eversion: 5/5 Inversion: 5/5  Leg strength  Quad: 5/5 Hamstring: 5/5 Hip flexor: 5/5 Hip abductors: 5/5  Gait unremarkable.   Osteopathic findings T9 extended rotated and side bent left L2 flexed rotated and side bent right Sacrum right on right    Impression and Recommendations:     This case required medical decision making of moderate complexity.      Note: This dictation was prepared with Dragon dictation along with smaller phrase technology. Any transcriptional errors that result from this process are unintentional.

## 2017-05-27 ENCOUNTER — Ambulatory Visit: Payer: BLUE CROSS/BLUE SHIELD | Admitting: Family Medicine

## 2017-05-27 ENCOUNTER — Encounter: Payer: Self-pay | Admitting: Family Medicine

## 2017-05-27 VITALS — BP 124/72 | HR 96 | Ht 62.0 in | Wt 162.0 lb

## 2017-05-27 DIAGNOSIS — M5442 Lumbago with sciatica, left side: Secondary | ICD-10-CM | POA: Diagnosis not present

## 2017-05-27 DIAGNOSIS — G8929 Other chronic pain: Secondary | ICD-10-CM

## 2017-05-27 DIAGNOSIS — M999 Biomechanical lesion, unspecified: Secondary | ICD-10-CM | POA: Diagnosis not present

## 2017-05-27 DIAGNOSIS — M5441 Lumbago with sciatica, right side: Secondary | ICD-10-CM | POA: Diagnosis not present

## 2017-05-27 NOTE — Patient Instructions (Signed)
Good to see you  Ice is your friend Continue the medications  See me again in 6-8 weeks

## 2017-05-27 NOTE — Assessment & Plan Note (Signed)
Decision today to treat with OMT was based on Physical Exam  After verbal consent patient was treated with HVLA, ME, FPR techniques in cervical, thoracic, lumbar and sacral areas  Patient tolerated the procedure well with improvement in symptoms  Patient given exercises, stretches and lifestyle modifications  See medications in patient instructions if given  Patient will follow up in 6-8 weeks

## 2017-05-27 NOTE — Assessment & Plan Note (Signed)
Discussed with patient in great length.  Discussed icing regimen and home exercises.  Discussed which activities to do which wants to avoid.  Patient is to increase activity as tolerated.  Patient has responded well to manipulation will continue.  Follow-up again in 6 to 8 weeks

## 2017-07-15 NOTE — Progress Notes (Deleted)
[name] [address] [place], [place] [address] Phone: [phone] Subjective:    I'm seeing this patient by the request  of:    CC:   [id number]  [name] is a 42 y.o. female coming in with complaint of ***  Onset-  Location Duration-  Character- Aggravating factors- Reliving factors-  Therapies tried-  Severity-     Past Medical History:  Diagnosis Date  . Anxiety   . Back pain   . Depression    Past Surgical History:  Procedure Laterality Date  . FOOT SURGERY     Social History   Socioeconomic History  . Marital status: Married    Spouse name: Not on file  . Number of children: 3  . Years of education: 2 years of college  . Highest education level: Not on file  Occupational History  . Occupation: Agricultural engineer  Social Needs  . Financial resource strain: Not on file  . Food insecurity:    Worry: Not on file    Inability: Not on file  . Transportation needs:    Medical: Not on file    Non-medical: Not on file  Tobacco Use  . Smoking status: Never Smoker  . Smokeless tobacco: Never Used  Substance and Sexual Activity  . Alcohol use: Yes    Comment: Occasionally  . Drug use: No  . Sexual activity: Not on file  Lifestyle  . Physical activity:    Days per week: Not on file    Minutes per session: Not on file  . Stress: Not on file  Relationships  . Social connections:    Talks on phone: Not on file    Gets together: Not on file    Attends religious service: Not on file    Active member of club or organization: Not on file    Attends meetings of clubs or organizations: Not on file    Relationship status: Not on file  Other Topics Concern  . Not on file  Social History Narrative   Lives at home with her husband and children.   Right-handed.   1 cup caffeine day.   Allergies  Allergen Reactions  . Meloxicam Nausea And Vomiting   Family History  Problem Relation Age of Onset  . Hyperlipidemia Mother    . Depression Mother   . Anxiety disorder Mother   . Neuropathy Mother   . Fibromyalgia Mother   . Heart disease Father      Past medical history, social, surgical and family history all reviewed in electronic medical record.  No pertanent information unless stated regarding to the chief complaint.   Review of Systems:Review of systems updated and as accurate as of 07/15/17  No headache, visual changes, nausea, vomiting, diarrhea, constipation, dizziness, abdominal pain, skin rash, fevers, chills, night sweats, weight loss, swollen lymph nodes, body aches, joint swelling, muscle aches, chest pain, shortness of breath, mood changes.   Objective  There were no vitals taken for this visit. Systems examined below as of 07/15/17   General: No apparent distress alert and oriented x3 mood and affect normal, dressed appropriately.  HEENT: Pupils equal, extraocular movements intact  Respiratory: Patient's speak in full sentences and does not appear short of breath  Cardiovascular: No lower extremity edema, non tender, no erythema  Skin: Warm dry intact with no signs of infection or rash on extremities or on axial skeleton.  Abdomen: Soft nontender  Neuro: Cranial nerves II through XII are intact,  neurovascularly intact in all extremities with 2+ DTRs and 2+ pulses.  Lymph: No lymphadenopathy of posterior or anterior cervical chain or axillae bilaterally.  Gait normal with good balance and coordination.  MSK:  Non tender with full range of motion and good stability and symmetric [name] and tone of shoulders, elbows, wrist, hip, knee and ankles bilaterally.     Impression and Recommendations:     This case required medical decision making of moderate complexity.      Note: This dictation was prepared with Dragon dictation along with smaller phrase technology. Any transcriptional errors that result from this process are unintentional.

## 2017-07-16 ENCOUNTER — Ambulatory Visit: Payer: BLUE CROSS/BLUE SHIELD | Admitting: Family Medicine

## 2017-08-14 ENCOUNTER — Encounter: Payer: Self-pay | Admitting: Family Medicine

## 2017-08-14 ENCOUNTER — Ambulatory Visit: Payer: BLUE CROSS/BLUE SHIELD | Admitting: Family Medicine

## 2017-08-14 ENCOUNTER — Ambulatory Visit: Payer: Self-pay

## 2017-08-14 VITALS — BP 118/88 | HR 89 | Ht 62.0 in

## 2017-08-14 DIAGNOSIS — M25551 Pain in right hip: Secondary | ICD-10-CM

## 2017-08-14 DIAGNOSIS — M25552 Pain in left hip: Principal | ICD-10-CM

## 2017-08-14 DIAGNOSIS — M7061 Trochanteric bursitis, right hip: Secondary | ICD-10-CM | POA: Diagnosis not present

## 2017-08-14 DIAGNOSIS — M7062 Trochanteric bursitis, left hip: Secondary | ICD-10-CM | POA: Diagnosis not present

## 2017-08-14 NOTE — Patient Instructions (Signed)
Good to see you  Overall I think you will do great  Stay active.  Ice 20 minutes 2 times daily. Usually after activity and before bed. See me again in 4 weeks

## 2017-08-14 NOTE — Assessment & Plan Note (Signed)
Bilateral injections given.  Discussed icing regimen and home exercise.  Discussed topical anti-inflammatories.  Patient is doing well with the medications.  No changes there.  Encourage patient to continue to work on core strengthening.  Follow-up again in 4 to 8 weeks

## 2017-08-14 NOTE — Progress Notes (Signed)
[name] [place], [place] [address] Phone: [phone] Subjective:    I'm seeing this patient by the request  of:     CC: Bilateral hip pain  [id number]  [name] is a 42 y.o. female coming in with complaint of back pain. Has recently taken a trip by vehicle in which caused her to become tight. She also has been having bilateral hip pain. She has been having some trouble sleeping due to the pain. Pain is constant and chronic.  Patient states it is waking her up at night, no significant radiation down the legs.  No numbness or tingling.     Past Medical History:  Diagnosis Date  . Anxiety   . Back pain   . Depression    Past Surgical History:  Procedure Laterality Date  . FOOT SURGERY     Social History   Socioeconomic History  . Marital status: Married    Spouse name: Not on file  . Number of children: 3  . Years of education: 2 years of college  . Highest education level: Not on file  Occupational History  . Occupation: Agricultural engineer  Social Needs  . Financial resource strain: Not on file  . Food insecurity:    Worry: Not on file    Inability: Not on file  . Transportation needs:    Medical: Not on file    Non-medical: Not on file  Tobacco Use  . Smoking status: Never Smoker  . Smokeless tobacco: Never Used  Substance and Sexual Activity  . Alcohol use: Yes    Comment: Occasionally  . Drug use: No  . Sexual activity: Not on file  Lifestyle  . Physical activity:    Days per week: Not on file    Minutes per session: Not on file  . Stress: Not on file  Relationships  . Social connections:    Talks on phone: Not on file    Gets together: Not on file    Attends religious service: Not on file    Active member of club or organization: Not on file    Attends meetings of clubs or organizations: Not on file    Relationship status: Not on file  Other Topics Concern  . Not on file  Social History Narrative   Lives at home with her husband and children.   Right-handed.   1 cup caffeine day.   Allergies  Allergen Reactions  . Meloxicam Nausea And Vomiting   Family History  Problem Relation Age of Onset  . Hyperlipidemia Mother   . Depression Mother   . Anxiety disorder Mother   . Neuropathy Mother   . Fibromyalgia Mother   . Heart disease Father      Past medical history, social, surgical and family history all reviewed in electronic medical record.  No pertanent information unless stated regarding to the chief complaint.   Review of Systems:Review of systems updated and as accurate as of 08/14/17  No headache, visual changes, nausea, vomiting, diarrhea, constipation, dizziness, abdominal pain, skin rash, fevers, chills, night sweats, weight loss, swollen lymph nodes, body aches, joint swelling, muscle aches, chest pain, shortness of breath, mood changes.   Objective  Blood pressure 118/88, pulse 89, height 5' 2"  (1.575 m), SpO2 98 %. Systems examined below as of 08/14/17   General: No apparent distress alert and oriented x3 mood and affect normal, dressed appropriately.  HEENT: Pupils equal, extraocular movements intact  Respiratory: Patient's speak  in full sentences and does not appear short of breath  Cardiovascular: No lower extremity edema, non tender, no erythema  Skin: Warm dry intact with no signs of infection or rash on extremities or on axial skeleton.  Abdomen: Soft nontender  Neuro: Cranial nerves II through XII are intact, neurovascularly intact in all extremities with 2+ DTRs and 2+ pulses.  Lymph: No lymphadenopathy of posterior or anterior cervical chain or axillae bilaterally.  Gait normal with good balance and coordination.  MSK:  Non tender with full range of motion and good stability and symmetric strength and tone of shoulders, elbows, wrist,  knee and ankles bilaterally.  Bilateral hip exams show the patient does have severe tenderness to palpation of the greater  trochanteric area with mild swelling palpated.  Positive Faber test.  Negative straight leg test.  Full range of motion.  Neurovascular intact distally.  4+ out of 5 strength but symmetric.  Minimal pain over the sacroiliac joints bilaterally   Procedure: Real-time Ultrasound Guided Injection of right greater trochanteric bursitis secondary to patient's body habitus Device: GE Logiq Q7 Ultrasound guided injection is preferred based studies that show increased duration, increased effect, greater accuracy, decreased procedural pain, increased response rate, and decreased cost with ultrasound guided versus blind injection.  Verbal informed consent obtained.  Time-out conducted.  Noted no overlying erythema, induration, or other signs of local infection.  Skin prepped in a sterile fashion.  Local anesthesia: Topical Ethyl chloride.  With sterile technique and under real time ultrasound guidance:  Greater trochanteric area was visualized and patient's bursa was noted. A 22-gauge 3 inch needle was inserted and 4 cc of 0.5% Marcaine and 1 cc of Kenalog 40 mg/dL was injected. Pictures taken Completed without difficulty  Pain immediately resolved suggesting accurate placement of the medication.  Advised to call if fevers/chills, erythema, induration, drainage, or persistent bleeding.  Images permanently stored and available for review in the ultrasound unit.  Impression: Technically successful ultrasound guided injection.   Procedure: Real-time Ultrasound Guided Injection of left  greater trochanteric bursitis secondary to patient's body habitus Device: GE Logiq Q7  Ultrasound guided injection is preferred based studies that show increased duration, increased effect, greater accuracy, decreased procedural pain, increased response rate, and decreased cost with ultrasound guided versus blind injection.  Verbal informed consent obtained.  Time-out conducted.  Noted no overlying erythema, induration, or  other signs of local infection.  Skin prepped in a sterile fashion.  Local anesthesia: Topical Ethyl chloride.  With sterile technique and under real time ultrasound guidance:  Greater trochanteric area was visualized and patient's bursa was noted. A 22-gauge 3 inch needle was inserted and 4 cc of 0.5% Marcaine and 1 cc of Kenalog 40 mg/dL was injected. Pictures taken Completed without difficulty  Pain immediately resolved suggesting accurate placement of the medication.  Advised to call if fevers/chills, erythema, induration, drainage, or persistent bleeding.  Images permanently stored and available for review in the ultrasound unit.  Impression: Technically successful ultrasound guided injection.   Impression and Recommendations:     This case required medical decision making of moderate complexity.      Note: This dictation was prepared with Dragon dictation along with smaller phrase technology. Any transcriptional errors that result from this process are unintentional.

## 2017-09-11 NOTE — Progress Notes (Signed)
[name] [place], [place] [address] Phone: [phone] Subjective:     CC: Bilateral hip pain  GYF:[id number]  Ashely Camarena is a 42 y.o. female coming in with complaint of bilateral hip pain. Her pain has eased up since last visit following the injections.  Patient states 85 to 90% better.  Has been traveling and sitting in a car though for a long amount of time.  Feels like that has aggravated some of the discomfort and pain.  States that it is no more of her regular baseline with her back and neck.  Has responded to manipulation for that previously.      Past Medical History:  Diagnosis Date  . Anxiety   . Back pain   . Depression    Past Surgical History:  Procedure Laterality Date  . FOOT SURGERY     Social History   Socioeconomic History  . Marital status: Married    Spouse name: Not on file  . Number of children: 3  . Years of education: 2 years of college  . Highest education level: Not on file  Occupational History  . Occupation: Agricultural engineer  Social Needs  . Financial resource strain: Not on file  . Food insecurity:    Worry: Not on file    Inability: Not on file  . Transportation needs:    Medical: Not on file    Non-medical: Not on file  Tobacco Use  . Smoking status: Never Smoker  . Smokeless tobacco: Never Used  Substance and Sexual Activity  . Alcohol use: Yes    Comment: Occasionally  . Drug use: No  . Sexual activity: Not on file  Lifestyle  . Physical activity:    Days per week: Not on file    Minutes per session: Not on file  . Stress: Not on file  Relationships  . Social connections:    Talks on phone: Not on file    Gets together: Not on file    Attends religious service: Not on file    Active member of club or organization: Not on file    Attends meetings of clubs or organizations: Not on file    Relationship status: Not on file  Other Topics Concern  . Not on file  Social History  Narrative   Lives at home with her husband and children.   Right-handed.   1 cup caffeine day.   Allergies  Allergen Reactions  . Meloxicam Nausea And Vomiting   Family History  Problem Relation Age of Onset  . Hyperlipidemia Mother   . Depression Mother   . Anxiety disorder Mother   . Neuropathy Mother   . Fibromyalgia Mother   . Heart disease Father      Past medical history, social, surgical and family history all reviewed in electronic medical record.  No pertanent information unless stated regarding to the chief complaint.   Review of Systems:Review of systems updated and as accurate as of 09/12/17  No headache, visual changes, nausea, vomiting, diarrhea, constipation, dizziness, abdominal pain, skin rash, fevers, chills, night sweats, weight loss, swollen lymph nodes, body aches, joint swelling, chest pain, shortness of breath, mood changes.  Positive muscle aches  Objective  Blood pressure 92/62, pulse 90, height 5' 2"  (1.575 m), weight 167 lb (75.8 kg), SpO2 97 %. Systems examined below as of 09/12/17   General: No apparent distress alert and oriented x3 mood and affect normal, dressed appropriately.  HEENT: Pupils  equal, extraocular movements intact  Respiratory: Patient's speak in full sentences and does not appear short of breath  Cardiovascular: No lower extremity edema, non tender, no erythema  Skin: Warm dry intact with no signs of infection or rash on extremities or on axial skeleton.  Abdomen: Soft nontender  Neuro: Cranial nerves II through XII are intact, neurovascularly intact in all extremities with 2+ DTRs and 2+ pulses.  Lymph: No lymphadenopathy of posterior or anterior cervical chain or axillae bilaterally.  Gait normal with good balance and coordination.  MSK:  Non tender with full range of motion and good stability and symmetric strength and tone of shoulders, elbows, wrist, hip, knee and ankles bilaterally.  Back Exam:  Inspection: Mild loss of  lordosis Motion: Flexion 45 deg, Extension 25 deg, Side Bending to 45 deg bilaterally,  Rotation to 45 deg bilaterally  SLR laying: Negative  XSLR laying: Negative  Palpable tenderness: Tenderness to palpation the paraspinal musculature lumbar spine right greater than left. FABER: Positive Faber on the right. Sensory change: Gross sensation intact to all lumbar and sacral dermatomes.  Reflexes: 2+ at both patellar tendons, 2+ at achilles tendons, Babinski's downgoing.  Strength at foot  Plantar-flexion: 5/5 Dorsi-flexion: 5/5 Eversion: 5/5 Inversion: 5/5  Leg strength  Quad: 5/5 Hamstring: 5/5 Hip flexor: 5/5 Hip abductors: 4/5 on the right compared to full strength in the left Gait unremarkable.  Osteopathic findings  T7 extended rotated and side bent left L3 flexed rotated and side bent right Sacrum right on right     Impression and Recommendations:     This case required medical decision making of moderate complexity.      Note: This dictation was prepared with Dragon dictation along with smaller phrase technology. Any transcriptional errors that result from this process are unintentional.

## 2017-09-12 ENCOUNTER — Encounter: Payer: Self-pay | Admitting: Family Medicine

## 2017-09-12 ENCOUNTER — Ambulatory Visit (INDEPENDENT_AMBULATORY_CARE_PROVIDER_SITE_OTHER): Payer: BLUE CROSS/BLUE SHIELD | Admitting: Family Medicine

## 2017-09-12 VITALS — BP 92/62 | HR 90 | Ht 62.0 in | Wt 167.0 lb

## 2017-09-12 DIAGNOSIS — M5441 Lumbago with sciatica, right side: Secondary | ICD-10-CM

## 2017-09-12 DIAGNOSIS — G8929 Other chronic pain: Secondary | ICD-10-CM

## 2017-09-12 DIAGNOSIS — M5442 Lumbago with sciatica, left side: Secondary | ICD-10-CM | POA: Diagnosis not present

## 2017-09-12 DIAGNOSIS — M999 Biomechanical lesion, unspecified: Secondary | ICD-10-CM

## 2017-09-12 NOTE — Assessment & Plan Note (Signed)
Decision today to treat with OMT was based on Physical Exam  After verbal consent patient was treated with HVLA, ME, FPR techniques in  thoracic, lumbar and sacral areas  Patient tolerated the procedure well with improvement in symptoms  Patient given exercises, stretches and lifestyle modifications  See medications in patient instructions if given  Patient will follow up in 5-6 weeks

## 2017-09-12 NOTE — Patient Instructions (Signed)
Good to see you  See me again in 5-6 weeks

## 2017-09-12 NOTE — Assessment & Plan Note (Signed)
Low back pain.  Discussed with patient that posture and ergonomics.  Continue the vitamin D supplementation.  Patient was unable to tolerate the Effexor.  We discussed with patient about continuing the possibility of the weight loss.  Hopefully no other injections are necessary.  Follow-up again in 5 to 6 weeks

## 2017-10-16 NOTE — Progress Notes (Signed)
[name] [place], [place] [address] Phone: [phone] Subjective:    I [name] am serving as a Education administrator for Dr. Hulan Saas.   CC: Back pain  OIZ:[id number]  [name] is a 42 y.o. female coming in with complaint of back pain. States that her back is feeling better today.  Overall has been doing relatively well.  Not have any of the hip pain anymore.  More of the tightness of the neck and upper back.  No radiation of the moment.  Has been trying to be a little more active.     Past Medical History:  Diagnosis Date  . Anxiety   . Back pain   . Depression    Past Surgical History:  Procedure Laterality Date  . FOOT SURGERY     Social History   Socioeconomic History  . Marital status: Married    Spouse name: Not on file  . Number of children: 3  . Years of education: 2 years of college  . Highest education level: Not on file  Occupational History  . Occupation: Agricultural engineer  Social Needs  . Financial resource strain: Not on file  . Food insecurity:    Worry: Not on file    Inability: Not on file  . Transportation needs:    Medical: Not on file    Non-medical: Not on file  Tobacco Use  . Smoking status: Never Smoker  . Smokeless tobacco: Never Used  Substance and Sexual Activity  . Alcohol use: Yes    Comment: Occasionally  . Drug use: No  . Sexual activity: Not on file  Lifestyle  . Physical activity:    Days per week: Not on file    Minutes per session: Not on file  . Stress: Not on file  Relationships  . Social connections:    Talks on phone: Not on file    Gets together: Not on file    Attends religious service: Not on file    Active member of club or organization: Not on file    Attends meetings of clubs or organizations: Not on file    Relationship status: Not on file  Other Topics Concern  . Not on file  Social History Narrative   Lives at home with her husband and children.   Right-handed.   1 cup caffeine day.   Allergies  Allergen Reactions  . Meloxicam Nausea And Vomiting   Family History  Problem Relation Age of Onset  . Hyperlipidemia Mother   . Depression Mother   . Anxiety disorder Mother   . Neuropathy Mother   . Fibromyalgia Mother   . Heart disease Father          Current Outpatient Medications (Other):  Marland Kitchen  buPROPion (WELLBUTRIN) 75 MG tablet, Take 150 mg by mouth 1 day or 1 dose.  .  Cholecalciferol (VITAMIN D PO), Take by mouth daily. .  Multiple Vitamins-Minerals (MULTIVITAMIN PO), Take by mouth daily. .  traZODone (DESYREL) 50 MG tablet, Take 50 mg by mouth at bedtime. .  Vitamin D, Ergocalciferol, (DRISDOL) 50000 units CAPS capsule, Take 1 capsule (50,000 Units total) by mouth every 7 (seven) days.    Past medical history, social, surgical and family history all reviewed in electronic medical record.  No pertanent information unless stated regarding to the chief complaint.   Review of Systems:  No headache, visual changes, nausea, vomiting, diarrhea, constipation, dizziness, abdominal pain, skin rash, fevers, chills, night  sweats, weight loss, swollen lymph nodes, body aches, joint swelling, chest pain, shortness of breath, mood changes.  Positive muscle aches  Objective  Blood pressure 110/80, pulse 76, height 5' 2"  (1.575 m), weight 160 lb (72.6 kg), SpO2 97 %.    General: No apparent distress alert and oriented x3 mood and affect normal, dressed appropriately.  HEENT: Pupils equal, extraocular movements intact  Respiratory: Patient's speak in full sentences and does not appear short of breath  Cardiovascular: No lower extremity edema, non tender, no erythema  Skin: Warm dry intact with no signs of infection or rash on extremities or on axial skeleton.  Abdomen: Soft nontender  Neuro: Cranial nerves II through XII are intact, neurovascularly intact in all extremities with 2+ DTRs and 2+ pulses.  Lymph: No lymphadenopathy of  posterior or anterior cervical chain or axillae bilaterally.  Gait normal with good balance and coordination.  MSK:  Non tender with full range of motion and good stability and symmetric strength and tone of shoulders, elbows, wrist, hip, knee and ankles bilaterally.  Back Exam:  Inspection: Mild loss of lordosis. Motion: Flexion 45 deg, Extension 25 deg, Side Bending to 45 deg bilaterally,  Rotation to 45 deg bilaterally  SLR laying: Negative  XSLR laying: Negative  Palpable tenderness: More tenderness on the thoracolumbar juncture than the sacroiliac joint. FABER: negative. Sensory change: Gross sensation intact to all lumbar and sacral dermatomes.  Reflexes: 2+ at both patellar tendons, 2+ at achilles tendons, Babinski's downgoing.  Strength at foot  Plantar-flexion: 5/5 Dorsi-flexion: 5/5 Eversion: 5/5 Inversion: 5/5  Leg strength  Quad: 5/5 Hamstring: 5/5 Hip flexor: 5/5 Hip abductors: 5/5  Gait unremarkable.  Osteopathic findings C2 flexed rotated and side bent right T4 extended rotated and side bent right inhaled  rib T9 extended rotated and side bent left L4 flexed rotated and side bent left  Sacrum right on right     Impression and Recommendations:     This case required medical decision making of moderate complexity. The above documentation has been reviewed and is accurate and complete Lyndal Pulley, DO       Note: This dictation was prepared with Dragon dictation along with smaller phrase technology. Any transcriptional errors that result from this process are unintentional.

## 2017-10-17 ENCOUNTER — Ambulatory Visit (INDEPENDENT_AMBULATORY_CARE_PROVIDER_SITE_OTHER): Payer: BLUE CROSS/BLUE SHIELD | Admitting: Family Medicine

## 2017-10-17 ENCOUNTER — Encounter: Payer: Self-pay | Admitting: Family Medicine

## 2017-10-17 VITALS — BP 110/80 | HR 76 | Ht 62.0 in | Wt 160.0 lb

## 2017-10-17 DIAGNOSIS — M9903 Segmental and somatic dysfunction of lumbar region: Secondary | ICD-10-CM

## 2017-10-17 DIAGNOSIS — M9901 Segmental and somatic dysfunction of cervical region: Secondary | ICD-10-CM

## 2017-10-17 DIAGNOSIS — G8929 Other chronic pain: Secondary | ICD-10-CM | POA: Diagnosis not present

## 2017-10-17 DIAGNOSIS — M9902 Segmental and somatic dysfunction of thoracic region: Secondary | ICD-10-CM | POA: Diagnosis not present

## 2017-10-17 DIAGNOSIS — M5441 Lumbago with sciatica, right side: Secondary | ICD-10-CM | POA: Diagnosis not present

## 2017-10-17 DIAGNOSIS — M5442 Lumbago with sciatica, left side: Secondary | ICD-10-CM | POA: Diagnosis not present

## 2017-10-17 DIAGNOSIS — M999 Biomechanical lesion, unspecified: Secondary | ICD-10-CM | POA: Insufficient documentation

## 2017-10-17 DIAGNOSIS — M9908 Segmental and somatic dysfunction of rib cage: Secondary | ICD-10-CM

## 2017-10-17 DIAGNOSIS — M9904 Segmental and somatic dysfunction of sacral region: Secondary | ICD-10-CM

## 2017-10-17 NOTE — Assessment & Plan Note (Signed)
Stable overall.  Doing relatively well.  Patient will continue the same medications.  Follow-up with me again in 6 to 8 weeks.  Encouraged to continue think core strengthening.

## 2017-10-17 NOTE — Patient Instructions (Signed)
Ia impressed Keep it up  Stay active No real changes  See me again in 6-8 weeks

## 2017-10-17 NOTE — Assessment & Plan Note (Signed)
Decision today to treat with OMT was based on Physical Exam  After verbal consent patient was treated with HVLA, ME, FPR techniques in cervical, thoracic, rib, lumbar and sacral areas  Patient tolerated the procedure well with improvement in symptoms  Patient given exercises, stretches and lifestyle modifications  See medications in patient instructions if given  Patient will follow up in 6-8 weeks

## 2017-10-31 ENCOUNTER — Ambulatory Visit: Payer: BLUE CROSS/BLUE SHIELD | Admitting: Psychiatry

## 2017-10-31 DIAGNOSIS — F4310 Post-traumatic stress disorder, unspecified: Secondary | ICD-10-CM | POA: Diagnosis not present

## 2017-10-31 NOTE — Progress Notes (Signed)
      [facility] Counselor/Therapist Progress Note   Patient ID: [name], MRN: [id number]  Date: 10/31/2017  Timespent: 50 minutes  Treatment Type: Individual  Subjective: Patient was present for session.  Patient shared that she was doing better on her medication but wanted to get to the bottom of her issues and truly resolved some past traumas.  Patient had researched E MDR as a treatment modality.  She had lots of questions that were discussed in session.  At the end of discussion patient reported wanting to try it and treatment so that traumas can be resolved.  Patient was taught the grounded in 5 exercise and how to use the utilize that the negative emotions surface.  Patient shared that she gets very triggered over the weekend when everyone is at home.  Patient agreed to try the exercise as well as different parenting techniques to help keep her children more focused on chores during the weekend.  Interventions:CBT and Solution Focused, grounding exercise  Mental Status Exam:   Appearance:   Well Groomed     Behavior:  Appropriate  Motor:  Normal  Speech/Language:   Normal Rate  Affect:  Appropriate  Mood:  anxious  Thought process:  Coherent  Thought content:    Logical  Perceptual disturbances:    Normal  Orientation:  Full (Time, Place, and Person)  Attention:  Good  Concentration:  good  Memory:  Immediate  Fund of knowledge:   Good  Insight:    Good  Judgment:   Good  Impulse Control:  good    Reported Symptoms: anxiety,flashbacks  Risk Assessment: Danger to Self:  No Self-injurious Behavior: No Danger to Others: No Duty to Warn:no Physical Aggression / Violence:No  Access to Firearms a concern: No  Gang Involvement:No   Diagnosis:   ICD-10-CM   1. PTSD (post-traumatic stress disorder) F43.10      Plan: Patient is to utilize exercise from session to help decrease extreme emotions.  Is to start E MDR at next session to address trauma issues.   Patient is to continue taking medication as directed.  Patient is to return to treatment 2  to 4 times a month as needed.  Patient will call 911 or go to an emergency review if she is unsafe at any time.  Lina Sayre, [place]

## 2017-11-25 ENCOUNTER — Ambulatory Visit: Payer: BLUE CROSS/BLUE SHIELD | Admitting: Psychiatry

## 2017-11-25 ENCOUNTER — Encounter: Payer: Self-pay | Admitting: Psychiatry

## 2017-11-25 DIAGNOSIS — F4310 Post-traumatic stress disorder, unspecified: Secondary | ICD-10-CM | POA: Diagnosis not present
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
## 2017-11-29 ENCOUNTER — Ambulatory Visit: Payer: BLUE CROSS/BLUE SHIELD | Admitting: Family Medicine

## 2017-12-03 ENCOUNTER — Ambulatory Visit: Payer: BLUE CROSS/BLUE SHIELD | Admitting: Psychiatry

## 2017-12-03 DIAGNOSIS — F4310 Post-traumatic stress disorder, unspecified: Secondary | ICD-10-CM

## 2017-12-03 NOTE — Progress Notes (Signed)
[name] [place], [place] [address] Phone: [phone] Subjective:    I [name] am serving as a Education administrator for Dr. Hulan Saas.    CC: Back pain  QQI:[id number]  [name] is a 42 y.o. female coming in with complaint of back pain. Back is doing well. Hips are sore.  Patient states that overall doing relatively well.  Some mild pain on the lateral aspect of the hip.  Last injection was greater than 4 months ago.  Rates the severity of pain though is 3 out of 10.      Past Medical History:  Diagnosis Date  . Anxiety   . Back pain   . Depression    Past Surgical History:  Procedure Laterality Date  . FOOT SURGERY     Social History   Socioeconomic History  . Marital status: Married    Spouse name: Not on file  . Number of children: 3  . Years of education: 2 years of college  . Highest education level: Not on file  Occupational History  . Occupation: Agricultural engineer  Social Needs  . Financial resource strain: Not on file  . Food insecurity:    Worry: Not on file    Inability: Not on file  . Transportation needs:    Medical: Not on file    Non-medical: Not on file  Tobacco Use  . Smoking status: Never Smoker  . Smokeless tobacco: Never Used  Substance and Sexual Activity  . Alcohol use: Yes    Comment: Occasionally  . Drug use: No  . Sexual activity: Not on file  Lifestyle  . Physical activity:    Days per week: Not on file    Minutes per session: Not on file  . Stress: Not on file  Relationships  . Social connections:    Talks on phone: Not on file    Gets together: Not on file    Attends religious service: Not on file    Active member of club or organization: Not on file    Attends meetings of clubs or organizations: Not on file    Relationship status: Not on file  Other Topics Concern  . Not on file  Social History Narrative   Lives at home with her husband and children.   Right-handed.   1 cup  caffeine day.   Allergies  Allergen Reactions  . Meloxicam Nausea And Vomiting   Family History  Problem Relation Age of Onset  . Hyperlipidemia Mother   . Depression Mother   . Anxiety disorder Mother   . Neuropathy Mother   . Fibromyalgia Mother   . Heart disease Father          Current Outpatient Medications (Other):  Marland Kitchen  buPROPion (WELLBUTRIN) 75 MG tablet, Take 150 mg by mouth 1 day or 1 dose.  .  Cholecalciferol (VITAMIN D PO), Take by mouth daily. .  Multiple Vitamins-Minerals (MULTIVITAMIN PO), Take by mouth daily. .  traZODone (DESYREL) 50 MG tablet, Take 50 mg by mouth at bedtime. .  Vitamin D, Ergocalciferol, (DRISDOL) 50000 units CAPS capsule, Take 1 capsule (50,000 Units total) by mouth every 7 (seven) days.    Past medical history, social, surgical and family history all reviewed in electronic medical record.  No pertanent information unless stated regarding to the chief complaint.   Review of Systems:  No headache, visual changes, nausea, vomiting, diarrhea, constipation, dizziness, abdominal pain, skin rash, fevers, chills, night sweats,  weight loss, swollen lymph nodes, body aches, joint swelling,  chest pain, shortness of breath, mood changes.  Mild positive muscle aches  Objective  Blood pressure 100/60, pulse 70, height 5' 2"  (1.575 m), weight 167 lb (75.8 kg), SpO2 98 %.    General: No apparent distress alert and oriented x3 mood and affect normal, dressed appropriately.  HEENT: Pupils equal, extraocular movements intact  Respiratory: Patient's speak in full sentences and does not appear short of breath  Cardiovascular: No lower extremity edema, non tender, no erythema  Skin: Warm dry intact with no signs of infection or rash on extremities or on axial skeleton.  Abdomen: Soft nontender  Neuro: Cranial nerves II through XII are intact, neurovascularly intact in all extremities with 2+ DTRs and 2+ pulses.  Lymph: No lymphadenopathy of posterior or  anterior cervical chain or axillae bilaterally.  Gait normal with good balance and coordination.  MSK:  Non tender with full range of motion and good stability and symmetric strength and tone of shoulders, elbows, wrist, hip, knee and ankles bilaterally.  Back Exam:  Inspection: Mild loss of lordosis Motion: Flexion 45 deg, Extension 25 deg, Side Bending to 35 deg bilaterally,  Rotation to 35 deg bilaterally  SLR laying: Negative  XSLR laying: Negative  Palpable tenderness: Tender to palpation the paraspinal musculature lumbar spine right greater than left. FABER: negative. Sensory change: Gross sensation intact to all lumbar and sacral dermatomes.  Reflexes: 2+ at both patellar tendons, 2+ at achilles tendons, Babinski's downgoing.  Strength at foot  Plantar-flexion: 5/5 Dorsi-flexion: 5/5 Eversion: 5/5 Inversion: 5/5  Leg strength  Quad: 5/5 Hamstring: 5/5 Hip flexor: 5/5 Hip abductors: 5/5  Gait unremarkable.  Osteopathic findings C5 flexed rotated and side bent left T4 extended rotated and side bent right inhaled rib T9 extended rotated and side bent left L2 flexed rotated and side bent left Sacrum right on right    Impression and Recommendations:     This case required medical decision making of moderate complexity. The above documentation has been reviewed and is accurate and complete Lyndal Pulley, DO       Note: This dictation was prepared with Dragon dictation along with smaller phrase technology. Any transcriptional errors that result from this process are unintentional.

## 2017-12-03 NOTE — Progress Notes (Signed)
      [facility] Counselor/Therapist Progress Note   Patient ID: [name], MRN: [id number]  Date: 12/03/2017  Timespent: 51 minutes   Treatment Type: Individual   Reported Symptoms: Sleep disturbance and depression, anxiety   Mental Status Exam:    Appearance:   Well Groomed     Behavior:  Appropriate  Motor:  Normal  Speech/Language:   Normal Rate  Affect:  Appropriate  Mood:  normal  Thought process:  normal  Thought content:    WNL  Sensory/Perceptual disturbances:    WNL  Orientation:  oriented to person, place and time/date  Attention:  Good  Concentration:  Good  Memory:  WNL  Fund of knowledge:   Good  Insight:    Good  Judgment:   Good  Impulse Control:  Good     Risk Assessment: Danger to Self:  No Self-injurious Behavior: No Danger to Others: No Duty to Warn:no Physical Aggression / Violence:No  Access to Firearms a concern: No  Gang Involvement:No    Subjective: Patient was present for session.  She reported that she had a hard time after last session.  Discussed how that was normal and that today may be an easier session.  Had patient do more talking about different cognitions that she got from her parents growing up.  Patient seemed to have a theme, i.e. you are the problem, bad things are your fault, you never do anything right, your brother comes first, are some of the messages that seem to be given to her by her parents on a regular basis.  Patient acknowledged that her mother had lots of anxiety when she was growing up and her dad probably had PTSD from the [place] War.  Both parents also were struggling with the loss of their first baby that was a daughter.  Discussed how that may have impacted the way patient was treated while she was growing up.  Patient explained some of her behaviors seem to be triggered by comments or behaviors of her father.  Patient was encouraged to start working on positive affirmations regularly.  She was given a list  of suggestions to start using to get the right messages on a regular basis rather than staying in the cycle of repeating the negative wants to her self.  We will continue to address issues at next session.   Interventions: Solution-Oriented/Positive Psychology   Diagnosis:   ICD-10-CM   1. PTSD (post-traumatic stress disorder) F43.10      Plan: 1.  Patient to continue to engage in individual counseling 2-4 times a month or as needed. 2.  Patient to identify and apply  coping skills learned in session to decrease triggered responses and anxiety symptoms. 3.  Patient to contact this office, go to the local ED or call 911 if a crisis or emergency develops between visits.   [name], [place]

## 2017-12-04 ENCOUNTER — Encounter: Payer: Self-pay | Admitting: Family Medicine

## 2017-12-04 ENCOUNTER — Ambulatory Visit (INDEPENDENT_AMBULATORY_CARE_PROVIDER_SITE_OTHER): Payer: BLUE CROSS/BLUE SHIELD | Admitting: Family Medicine

## 2017-12-04 VITALS — BP 100/60 | HR 70 | Ht 62.0 in | Wt 167.0 lb

## 2017-12-04 DIAGNOSIS — G8929 Other chronic pain: Secondary | ICD-10-CM | POA: Diagnosis not present

## 2017-12-04 DIAGNOSIS — M5441 Lumbago with sciatica, right side: Secondary | ICD-10-CM | POA: Diagnosis not present

## 2017-12-04 DIAGNOSIS — M999 Biomechanical lesion, unspecified: Secondary | ICD-10-CM | POA: Diagnosis not present

## 2017-12-04 DIAGNOSIS — M5442 Lumbago with sciatica, left side: Secondary | ICD-10-CM

## 2017-12-04 NOTE — Patient Instructions (Signed)
Good to see you  You are doing great Be proud of yourself Keep it up  New homework 3 times a week  See me again in 5 weeks and if not perfect will consider injections again

## 2017-12-04 NOTE — Assessment & Plan Note (Signed)
Doing well.  Does have some underlying greater trochanteric bursitis.  Discussed the hip abductor strengthening and the icing regimen.  Discussed which activities to doing which wants to avoid.  Discussed icing regimen.  Follow-up again in 4 to 6 weeks

## 2017-12-04 NOTE — Assessment & Plan Note (Addendum)
Decision today to treat with OMT was based on Physical Exam  After verbal consent patient was treated with HVLA, ME, FPR techniques in cervical, thoracic, rib, lumbar and sacral areas  Patient tolerated the procedure well with improvement in symptoms  Patient given exercises, stretches and lifestyle modifications  See medications in patient instructions if given  Patient will follow up in 4-6 weeks

## 2017-12-13 ENCOUNTER — Encounter

## 2017-12-16 ENCOUNTER — Ambulatory Visit: Payer: BLUE CROSS/BLUE SHIELD | Admitting: Psychiatry

## 2017-12-16 DIAGNOSIS — F4310 Post-traumatic stress disorder, unspecified: Secondary | ICD-10-CM | POA: Diagnosis not present
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
## 2017-12-25 ENCOUNTER — Encounter: Payer: Self-pay | Admitting: Psychiatry

## 2017-12-25 ENCOUNTER — Ambulatory Visit (INDEPENDENT_AMBULATORY_CARE_PROVIDER_SITE_OTHER): Payer: BLUE CROSS/BLUE SHIELD | Admitting: Psychiatry

## 2017-12-25 DIAGNOSIS — F4310 Post-traumatic stress disorder, unspecified: Secondary | ICD-10-CM | POA: Diagnosis not present

## 2017-12-25 NOTE — Progress Notes (Signed)
      [facility] Counselor/Therapist Progress Note  Patient ID: [name], MRN: [id number],    Date: 12/25/2017  Time Spent: 52 minutes  Treatment Type: Individual Therapy  Reported Symptoms: Depressed mood and Anxious Mood  Mental Status Exam:  Appearance:   Well Groomed     Behavior:  Appropriate  Motor:  Normal  Speech/Language:   Normal Rate  Affect:  Appropriate  Mood:  normal  Thought process:  normal  Thought content:    WNL  Sensory/Perceptual disturbances:    WNL  Orientation:  oriented to person, place and time/date  Attention:  Good  Concentration:  Good  Memory:  WNL  Fund of knowledge:   Good  Insight:    Good  Judgment:   Good  Impulse Control:  Good   Risk Assessment: Danger to Self:  No Self-injurious Behavior: No Danger to Others: No Duty to Warn:no Physical Aggression / Violence:No  Access to Firearms a concern: No  Gang Involvement:No   Subjective: Patient was present for session.  Patient reported.  She shared she got triggered over a few different incidents.  Patient wanted to work on the incident with her neighbor who told her "she could not depend on her for anything" patient suds level 10, negative cognition "I cannot do anything right", felt sadness hurt and anxiety in her neck and shoulders.  It was able to reduce suds level to 2.  Patient was able to acknowledge that she does a lot of things for her neighbor and it was probably just a really bad day.  Patient also was able to recognize that sometimes she and her husband just miss each other and that she does a lot for him as well.  Ways to help her stay focused on positives and recognizing what she does right were discussed with patient.  Interventions: Solution-Oriented/Positive Psychology and Eye Movement Desensitization and Reprocessing (EMDR)  Diagnosis:   ICD-10-CM   1. PTSD (post-traumatic stress disorder) F43.10     Plan: 1.  Patient to continue to engage in individual  counseling 2-4 times a month or as needed. 2.  Patient to identify and apply CBT, coping skills learned in session to decrease depression and anxiety symptoms. 3.  Patient to contact this office, go to the local ED or call 911 if a crisis or emergency develops between visits.  Lina Sayre, [place]

## 2017-12-31 ENCOUNTER — Encounter: Payer: Self-pay | Admitting: Psychiatry

## 2017-12-31 ENCOUNTER — Ambulatory Visit: Payer: BLUE CROSS/BLUE SHIELD | Admitting: Psychiatry

## 2017-12-31 DIAGNOSIS — F4310 Post-traumatic stress disorder, unspecified: Secondary | ICD-10-CM | POA: Diagnosis not present
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
## 2018-01-05 NOTE — Progress Notes (Signed)
[name] [place], [place] [address] Phone: [phone] Subjective:    I [name] am serving as a Education administrator for Dr. Hulan Saas.   CC: Back pain follow-up  BUL:[id number]  [name] is a 42 y.o. female coming in with complaint of back pain. States that when she went to sit down she felt something painful in her lower back (Saturday).  Patient has had this pain for quite some time again.  Patient states that it has come and gone.  Has responded well to manipulation in the past.  Feels like she is just a little worse than her regular baseline.  Patient denies any numbness or tingling down the toes that is regularly but had to have it intermittently.      Past Medical History:  Diagnosis Date  . Anxiety   . Back pain   . Depression    Past Surgical History:  Procedure Laterality Date  . FOOT SURGERY     Social History   Socioeconomic History  . Marital status: Married    Spouse name: Not on file  . Number of children: 3  . Years of education: 2 years of college  . Highest education level: Not on file  Occupational History  . Occupation: Agricultural engineer  Social Needs  . Financial resource strain: Not on file  . Food insecurity:    Worry: Not on file    Inability: Not on file  . Transportation needs:    Medical: Not on file    Non-medical: Not on file  Tobacco Use  . Smoking status: Never Smoker  . Smokeless tobacco: Never Used  Substance and Sexual Activity  . Alcohol use: Yes    Comment: Occasionally  . Drug use: No  . Sexual activity: Not on file  Lifestyle  . Physical activity:    Days per week: Not on file    Minutes per session: Not on file  . Stress: Not on file  Relationships  . Social connections:    Talks on phone: Not on file    Gets together: Not on file    Attends religious service: Not on file    Active member of club or organization: Not on file    Attends meetings of clubs or organizations: Not  on file    Relationship status: Not on file  Other Topics Concern  . Not on file  Social History Narrative   Lives at home with her husband and children.   Right-handed.   1 cup caffeine day.   Allergies  Allergen Reactions  . Meloxicam Nausea And Vomiting   Family History  Problem Relation Age of Onset  . Hyperlipidemia Mother   . Depression Mother   . Anxiety disorder Mother   . Neuropathy Mother   . Fibromyalgia Mother   . Heart disease Father      Current Outpatient Medications (Cardiovascular):  .  doxazosin (CARDURA) 4 MG tablet, Take 4 mg by mouth daily.     Current Outpatient Medications (Other):  Marland Kitchen  buPROPion (WELLBUTRIN) 75 MG tablet, Take 150 mg by mouth 1 day or 1 dose.  .  Cholecalciferol (VITAMIN D PO), Take by mouth daily. .  Multiple Vitamins-Minerals (MULTIVITAMIN PO), Take by mouth daily. .  sertraline (ZOLOFT) 100 MG tablet, Take 100 mg by mouth daily. .  Vitamin D, Ergocalciferol, (DRISDOL) 50000 units CAPS capsule, Take 1 capsule (50,000 Units total) by mouth every 7 (seven) days. .  traZODone (  DESYREL) 50 MG tablet, Take 50 mg by mouth at bedtime.    Past medical history, social, surgical and family history all reviewed in electronic medical record.  No pertanent information unless stated regarding to the chief complaint.   Review of Systems:  No headache, visual changes, nausea, vomiting, diarrhea, constipation, dizziness, abdominal pain, skin rash, fevers, chills, night sweats, weight loss, swollen lymph nodes, body aches, joint swelling,  chest pain, shortness of breath, mood changes.   Objective  Blood pressure 100/80, pulse 81, height 5' 2"  (1.575 m), weight 158 lb (71.7 kg), SpO2 99 %.    General: No apparent distress alert and oriented x3 mood and affect normal, dressed appropriately.  HEENT: Pupils equal, extraocular movements intact  Respiratory: Patient's speak in full sentences and does not appear short of breath  Cardiovascular: No  lower extremity edema, non tender, no erythema  Skin: Warm dry intact with no signs of infection or rash on extremities or on axial skeleton.  Abdomen: Soft nontender  Neuro: Cranial nerves II through XII are intact, neurovascularly intact in all extremities with 2+ DTRs and 2+ pulses.  Lymph: No lymphadenopathy of posterior or anterior cervical chain or axillae bilaterally.  Gait normal with good balance and coordination.  MSK:  Non tender with full range of motion and good stability and symmetric strength and tone of shoulders, elbows, wrist, hip, knee and ankles bilaterally.   Back Exam:  Inspection: Loss of lordosis Motion: Flexion 45 deg, Extension 20 deg, Side Bending to 35 deg bilaterally,  Rotation to 45 deg bilaterally  SLR laying: Negative  XSLR laying: Negative  Palpable tenderness: Tender to palpation. FABER: Positive Faber. Sensory change: Gross sensation intact to all lumbar and sacral dermatomes.  Reflexes: 2+ at both patellar tendons, 2+ at achilles tendons, Babinski's downgoing.  Strength at foot  Plantar-flexion: 5/5 Dorsi-flexion: 5/5 Eversion: 5/5 Inversion: 5/5  Leg strength  Quad: 5/5 Hamstring: 5/5 Hip flexor: 5/5 Hip abductors: 5/5  Gait unremarkable.      Impression and Recommendations:     The above documentation has been reviewed and is accurate and complete Lyndal Pulley, DO       Note: This dictation was prepared with Dragon dictation along with smaller phrase technology. Any transcriptional errors that result from this process are unintentional.

## 2018-01-06 ENCOUNTER — Ambulatory Visit (INDEPENDENT_AMBULATORY_CARE_PROVIDER_SITE_OTHER): Payer: BLUE CROSS/BLUE SHIELD | Admitting: Family Medicine

## 2018-01-06 ENCOUNTER — Encounter: Payer: Self-pay | Admitting: Family Medicine

## 2018-01-06 VITALS — BP 100/80 | HR 81 | Ht 62.0 in | Wt 158.0 lb

## 2018-01-06 DIAGNOSIS — M999 Biomechanical lesion, unspecified: Secondary | ICD-10-CM | POA: Diagnosis not present

## 2018-01-06 DIAGNOSIS — M5442 Lumbago with sciatica, left side: Secondary | ICD-10-CM | POA: Diagnosis not present

## 2018-01-06 DIAGNOSIS — M9981 Other biomechanical lesions of cervical region: Secondary | ICD-10-CM | POA: Diagnosis not present

## 2018-01-06 DIAGNOSIS — G8929 Other chronic pain: Secondary | ICD-10-CM | POA: Diagnosis not present

## 2018-01-06 DIAGNOSIS — M5441 Lumbago with sciatica, right side: Secondary | ICD-10-CM

## 2018-01-06 NOTE — Patient Instructions (Signed)
Good to see you  Ice is your friend Stay active pennsaid pinkie amount topically 2 times daily as needed.  See me again in 4-6 weeks

## 2018-01-06 NOTE — Assessment & Plan Note (Signed)
Stable overall.  Does have some more tightness than usual.  Discussed icing regimen and home exercise.  Discussed core stability.  Patient has not been working out as much secondary to the holidays.  Patient will follow-up with me again in 4 to 6 weeks.

## 2018-01-06 NOTE — Assessment & Plan Note (Signed)
Decision today to treat with OMT was based on Physical Exam  After verbal consent patient was treated with HVLA, ME, FPR techniques in cervical, thoracic, rib, lumbar and sacral areas  Patient tolerated the procedure well with improvement in symptoms  Patient given exercises, stretches and lifestyle modifications  See medications in patient instructions if given  Patient will follow up in 4-6 weeks

## 2018-01-08 ENCOUNTER — Ambulatory Visit: Payer: BLUE CROSS/BLUE SHIELD | Admitting: Psychiatry

## 2018-01-08 ENCOUNTER — Encounter: Payer: Self-pay | Admitting: Psychiatry

## 2018-01-08 DIAGNOSIS — F4310 Post-traumatic stress disorder, unspecified: Secondary | ICD-10-CM
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
## 2018-01-14 ENCOUNTER — Ambulatory Visit: Payer: BLUE CROSS/BLUE SHIELD | Admitting: Psychiatry

## 2018-01-15 ENCOUNTER — Encounter: Payer: Self-pay | Admitting: Psychiatry

## 2018-01-15 ENCOUNTER — Ambulatory Visit: Payer: BLUE CROSS/BLUE SHIELD | Admitting: Psychiatry

## 2018-01-15 ENCOUNTER — Ambulatory Visit (INDEPENDENT_AMBULATORY_CARE_PROVIDER_SITE_OTHER): Payer: BLUE CROSS/BLUE SHIELD | Admitting: Psychiatry

## 2018-01-15 VITALS — BP 93/56 | HR 76

## 2018-01-15 DIAGNOSIS — F330 Major depressive disorder, recurrent, mild: Secondary | ICD-10-CM | POA: Diagnosis not present

## 2018-01-15 DIAGNOSIS — F4310 Post-traumatic stress disorder, unspecified: Secondary | ICD-10-CM

## 2018-01-15 DIAGNOSIS — F5105 Insomnia due to other mental disorder: Secondary | ICD-10-CM | POA: Diagnosis not present

## 2018-01-15 DIAGNOSIS — F99 Mental disorder, not otherwise specified: Secondary | ICD-10-CM | POA: Diagnosis not present

## 2018-01-15 MED ORDER — SERTRALINE HCL 100 MG PO TABS: 150.0000 mg | ORAL_TABLET | Freq: Every day | ORAL | 5 refills | Status: DC

## 2018-01-15 MED ORDER — BUPROPION HCL ER (XL) 300 MG PO TB24: 300.0000 mg | ORAL_TABLET | Freq: Every day | ORAL | 5 refills | Status: DC

## 2018-01-15 MED ORDER — TRAZODONE HCL 50 MG PO TABS: 50.0000 mg | ORAL_TABLET | Freq: Every evening | ORAL | 5 refills | Status: DC | PRN

## 2018-01-15 NOTE — Progress Notes (Signed)
[name] [id number] July 19, 1975 42 y.o.  Subjective:   Patient ID:  [name] is a 42 y.o. (DOB January 31, 1975) female.  Chief Complaint:  Chief Complaint  Patient presents with  . Follow-up    Anxiety, depression, h/o sleep disturbance    HPI Arvis Mode presents to the office today for follow-up of anxiety and depression. She reports mood and anxiety have been "better."  She reports that she continues to have some anxiety and is better able to manage it. She reports that she notices some anxious thoughts and tension in neck and back. Denies any recent panic attacks. Reports that she has some continued mild depression and feels "I can handle it better." She reports improved sleep and has been awakening in the middle of the night recently. Denies any recent nightmares. Reports that her appetite has been stable. Denies low energy and motivation. She reports concentration has been about the same. Denies SI.   Past Psychiatric Medication Trials: Effexor Exar Wellbutrin Trazodone Sertraline Doxazosin   Review of Systems:  Review of Systems  Gastrointestinal: Negative.   Musculoskeletal: Negative for gait problem.  Neurological: Negative for tremors.  Psychiatric/Behavioral:       Please refer to HPI    Medications: I have reviewed the patient's current medications.  Current Outpatient Medications  Medication Sig Dispense Refill  . Ascorbic Acid (VITAMIN C) 100 MG tablet Take 100 mg by mouth daily.    Marland Kitchen buPROPion (WELLBUTRIN XL) 300 MG 24 hr tablet Take 1 tablet (300 mg total) by mouth daily. 30 tablet 5  . Cholecalciferol (VITAMIN D PO) Take by mouth daily.    Marland Kitchen doxazosin (CARDURA) 4 MG tablet Take 4 mg by mouth daily.    . Misc Natural Products (TART CHERRY ADVANCED PO) Take by mouth.    . Multiple Vitamins-Minerals (MULTIVITAMIN PO) Take by mouth daily.    . Omega-3 Fatty Acids (FISH OIL PO) Take by mouth.    . sertraline (ZOLOFT) 100 MG tablet Take 1.5 tablets (150  mg total) by mouth daily. 45 tablet 5  . traZODone (DESYREL) 50 MG tablet Take 1 tablet (50 mg total) by mouth at bedtime as needed. 30 tablet 5  . Vitamin D, Ergocalciferol, (DRISDOL) 50000 units CAPS capsule Take 1 capsule (50,000 Units total) by mouth every 7 (seven) days. (Patient not taking: Reported on 01/15/2018) 12 capsule 0   No current facility-administered medications for this visit.     Medication Side Effects: None  Allergies:  Allergies  Allergen Reactions  . Meloxicam Nausea And Vomiting    Past Medical History:  Diagnosis Date  . Anxiety   . Back pain   . Chronic hip pain   . Depression   . GERD (gastroesophageal reflux disease)     Family History  Problem Relation Age of Onset  . Hyperlipidemia Mother   . Depression Mother   . Anxiety disorder Mother   . Neuropathy Mother   . Fibromyalgia Mother   . Heart disease Father   . Depression Father   . Depression Maternal Uncle   . Bipolar disorder Maternal Grandmother     Social History   Socioeconomic History  . Marital status: Married    Spouse name: Not on file  . Number of children: 3  . Years of education: 2 years of college  . Highest education level: Not on file  Occupational History  . Occupation: Agricultural engineer  Social Needs  . Financial resource strain: Not on file  . Food insecurity:  Worry: Not on file    Inability: Not on file  . Transportation needs:    Medical: Not on file    Non-medical: Not on file  Tobacco Use  . Smoking status: Never Smoker  . Smokeless tobacco: Never Used  Substance and Sexual Activity  . Alcohol use: Yes    Comment: Occasionally  . Drug use: No  . Sexual activity: Not on file  Lifestyle  . Physical activity:    Days per week: Not on file    Minutes per session: Not on file  . Stress: Not on file  Relationships  . Social connections:    Talks on phone: Not on file    Gets together: Not on file    Attends religious service: Not on file    Active  member of club or organization: Not on file    Attends meetings of clubs or organizations: Not on file    Relationship status: Not on file  . Intimate partner violence:    Fear of current or ex partner: Not on file    Emotionally abused: Not on file    Physically abused: Not on file    Forced sexual activity: Not on file  Other Topics Concern  . Not on file  Social History Narrative   Lives at home with her husband and children.   Right-handed.   1 cup caffeine day.    Past Medical History, Surgical history, Social history, and Family history were reviewed and updated as appropriate.   Please see review of systems for further details on the patient's review from today.   Objective:   Physical Exam:  BP (!) 93/56   Pulse 76   Physical Exam Constitutional:      General: She is not in acute distress.    Appearance: She is well-developed.  Musculoskeletal:        General: No deformity.  Neurological:     Mental Status: She is alert and oriented to person, place, and time.     Coordination: Coordination normal.  Psychiatric:        Mood and Affect: Mood is not depressed. Affect is not labile, blunt, angry or inappropriate.        Speech: Speech normal.        Behavior: Behavior normal.        Thought Content: Thought content normal. Thought content does not include homicidal or suicidal ideation. Thought content does not include homicidal or suicidal plan.        Judgment: Judgment normal.     Comments: Insight intact. No auditory or visual hallucinations. No delusions.  Mildly anxious (less anxious compared to previous exams)     Lab Review:  No results found for: NA, K, CL, CO2, GLUCOSE, BUN, CREATININE, CALCIUM, PROT, ALBUMIN, AST, ALT, ALKPHOS, BILITOT, GFRNONAA, GFRAA     Component Value Date/Time   WBC 15.8 (H) 04/08/2007 0338   RBC 4.21 04/08/2007 0338   HGB 13.4 04/08/2007 0338   HCT 39.5 04/08/2007 0338   PLT 291 04/08/2007 0338   MCV 93.9 04/08/2007 0338    MCHC 33.8 04/08/2007 0338   RDW 16.5 (H) 04/08/2007 0338    No results found for: POCLITH, LITHIUM   No results found for: PHENYTOIN, PHENOBARB, VALPROATE, CBMZ   .res Assessment: Plan:   Continue current medications: Zoloft 150 mg po qd for depression and anxiety. Wellbutrin XL 300 mg po q am. Doxazosin 4 mg po qhs PTSD (post-traumatic stress disorder) - Chronic, improved.  -  Plan: sertraline (ZOLOFT) 100 MG tablet  Mild episode of recurrent major depressive disorder (HCC) - Improved - Plan: buPROPion (WELLBUTRIN XL) 300 MG 24 hr tablet  Insomnia due to other mental disorder - Chronic, improved - Plan: traZODone (DESYREL) 50 MG tablet  Please see After Visit Summary for patient specific instructions.  Future Appointments  Date Time Provider [place]  02/07/2018 11:00 AM Lina Sayre, [place] [facility] None  02/11/2018  9:15 AM Lyndal Pulley, DO [facility]  02/12/2018 10:00 AM Lina Sayre, LPC [facility] None  02/19/2018 11:00 AM Lina Sayre, LPC [facility] None  02/26/2018 10:00 AM Lina Sayre, LPC [facility] None  07/16/2018  1:30 PM Thayer Headings, PMHNP [facility] None    No orders of the defined types were placed in this encounter.     -------------------------------
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
## 2018-01-29 HISTORY — PX: MOUTH SURGERY: SHX715

## 2018-02-07 ENCOUNTER — Ambulatory Visit (INDEPENDENT_AMBULATORY_CARE_PROVIDER_SITE_OTHER): Payer: BLUE CROSS/BLUE SHIELD | Admitting: Psychiatry

## 2018-02-07 ENCOUNTER — Encounter: Payer: Self-pay | Admitting: Psychiatry

## 2018-02-07 ENCOUNTER — Encounter

## 2018-02-07 DIAGNOSIS — F4310 Post-traumatic stress disorder, unspecified: Secondary | ICD-10-CM
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
## 2018-02-11 ENCOUNTER — Ambulatory Visit: Payer: BLUE CROSS/BLUE SHIELD | Admitting: Family Medicine

## 2018-02-12 ENCOUNTER — Ambulatory Visit: Payer: BLUE CROSS/BLUE SHIELD | Admitting: Psychiatry

## 2018-02-12 ENCOUNTER — Encounter: Payer: Self-pay | Admitting: Psychiatry

## 2018-02-12 DIAGNOSIS — F4310 Post-traumatic stress disorder, unspecified: Secondary | ICD-10-CM
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
## 2018-02-19 ENCOUNTER — Ambulatory Visit: Payer: BLUE CROSS/BLUE SHIELD | Admitting: Psychiatry

## 2018-02-24 NOTE — Progress Notes (Deleted)
[name] [address] [place], [place] [address] Phone: [phone] Subjective:    I'm seeing this patient by the request  of:    CC:   [id number]  [name] is a 43 y.o. female coming in with complaint of ***  Onset-  Location Duration-  Character- Aggravating factors- Reliving factors-  Therapies tried-  Severity-     Past Medical History:  Diagnosis Date  . Anxiety   . Back pain   . Chronic hip pain   . Depression   . GERD (gastroesophageal reflux disease)    Past Surgical History:  Procedure Laterality Date  . FOOT SURGERY    . MOUTH SURGERY     Social History   Socioeconomic History  . Marital status: Married    Spouse name: Not on file  . Number of children: 3  . Years of education: 2 years of college  . Highest education level: Not on file  Occupational History  . Occupation: Agricultural engineer  Social Needs  . Financial resource strain: Not on file  . Food insecurity:    Worry: Not on file    Inability: Not on file  . Transportation needs:    Medical: Not on file    Non-medical: Not on file  Tobacco Use  . Smoking status: Never Smoker  . Smokeless tobacco: Never Used  Substance and Sexual Activity  . Alcohol use: Yes    Comment: Occasionally  . Drug use: No  . Sexual activity: Not on file  Lifestyle  . Physical activity:    Days per week: Not on file    Minutes per session: Not on file  . Stress: Not on file  Relationships  . Social connections:    Talks on phone: Not on file    Gets together: Not on file    Attends religious service: Not on file    Active member of club or organization: Not on file    Attends meetings of clubs or organizations: Not on file    Relationship status: Not on file  Other Topics Concern  . Not on file  Social History Narrative   Lives at home with her husband and children.   Right-handed.   1 cup caffeine day.   Allergies  Allergen Reactions  . Meloxicam Nausea And  Vomiting   Family History  Problem Relation Age of Onset  . Hyperlipidemia Mother   . Depression Mother   . Anxiety disorder Mother   . Neuropathy Mother   . Fibromyalgia Mother   . Heart disease Father   . Depression Father   . Depression Maternal Uncle   . Bipolar disorder Maternal Grandmother      Current Outpatient Medications (Cardiovascular):  .  doxazosin (CARDURA) 4 MG tablet, Take 4 mg by mouth daily.     Current Outpatient Medications (Other):  Marland Kitchen  Ascorbic Acid (VITAMIN C) 100 MG tablet, Take 100 mg by mouth daily. Marland Kitchen  buPROPion (WELLBUTRIN XL) 300 MG 24 hr tablet, Take 1 tablet (300 mg total) by mouth daily. .  Cholecalciferol (VITAMIN D PO), Take by mouth daily. .  Misc Natural Products (TART CHERRY ADVANCED PO), Take by mouth. .  Multiple Vitamins-Minerals (MULTIVITAMIN PO), Take by mouth daily. .  Omega-3 Fatty Acids (FISH OIL PO), Take by mouth. .  sertraline (ZOLOFT) 100 MG tablet, Take 1.5 tablets (150 mg total) by mouth daily. .  traZODone (DESYREL) 50 MG tablet, Take 1 tablet (50 mg total) by  mouth at bedtime as needed. .  Vitamin D, Ergocalciferol, (DRISDOL) 50000 units CAPS capsule, Take 1 capsule (50,000 Units total) by mouth every 7 (seven) days. (Patient not taking: Reported on 01/15/2018)    Past medical history, social, surgical and family history all reviewed in electronic medical record.  No pertanent information unless stated regarding to the chief complaint.   Review of Systems:  No headache, visual changes, nausea, vomiting, diarrhea, constipation, dizziness, abdominal pain, skin rash, fevers, chills, night sweats, weight loss, swollen lymph nodes, body aches, joint swelling, muscle aches, chest pain, shortness of breath, mood changes.   Objective  There were no vitals taken for this visit. Systems examined below as of    General: No apparent distress alert and oriented x3 mood and affect normal, dressed appropriately.  HEENT: Pupils equal,  extraocular movements intact  Respiratory: Patient's speak in full sentences and does not appear short of breath  Cardiovascular: No lower extremity edema, non tender, no erythema  Skin: Warm dry intact with no signs of infection or rash on extremities or on axial skeleton.  Abdomen: Soft nontender  Neuro: Cranial nerves II through XII are intact, neurovascularly intact in all extremities with 2+ DTRs and 2+ pulses.  Lymph: No lymphadenopathy of posterior or anterior cervical chain or axillae bilaterally.  Gait normal with good balance and coordination.  MSK:  Non tender with full range of motion and good stability and symmetric strength and tone of shoulders, elbows, wrist, hip, knee and ankles bilaterally.     Impression and Recommendations:     This case required medical decision making of moderate complexity. The above documentation has been reviewed and is accurate and complete Lyndal Pulley, DO       Note: This dictation was prepared with Dragon dictation along with smaller phrase technology. Any transcriptional errors that result from this process are unintentional.

## 2018-02-25 ENCOUNTER — Ambulatory Visit: Payer: Self-pay | Admitting: Family Medicine

## 2018-02-26 ENCOUNTER — Ambulatory Visit: Payer: BLUE CROSS/BLUE SHIELD | Admitting: Psychiatry

## 2018-02-26 ENCOUNTER — Encounter: Payer: Self-pay | Admitting: Psychiatry

## 2018-02-26 DIAGNOSIS — F4310 Post-traumatic stress disorder, unspecified: Secondary | ICD-10-CM | POA: Diagnosis not present
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
## 2018-03-05 ENCOUNTER — Other Ambulatory Visit: Payer: Self-pay

## 2018-03-05 MED ORDER — DOXAZOSIN MESYLATE 4 MG PO TABS: 4.0000 mg | ORAL_TABLET | Freq: Every day | ORAL | 5 refills | Status: DC

## 2018-03-11 NOTE — Progress Notes (Signed)
[name] [address], [place] [address] Phone: [phone] Subjective:     CC: Back and bilateral hip pain follow-up  CBU:[id number]  [name] is a 43 y.o. female coming in with complaint of back pain. States her hips are sore today. Back feels about the same.  Patient describes the pain as a dull, throbbing aching sensation.  Patient states that she has been walking more.  Feels like this could be contributing to some of the discomfort and pain.  Patient denies any radiation down the leg so.  Can be very uncomfortable at nighttime.    Past Medical History:  Diagnosis Date  . Anxiety   . Back pain   . Chronic hip pain   . Depression   . GERD (gastroesophageal reflux disease)    Past Surgical History:  Procedure Laterality Date  . FOOT SURGERY    . MOUTH SURGERY     Social History   Socioeconomic History  . Marital status: Married    Spouse name: Not on file  . Number of children: 3  . Years of education: 2 years of college  . Highest education level: Not on file  Occupational History  . Occupation: Agricultural engineer  Social Needs  . Financial resource strain: Not on file  . Food insecurity:    Worry: Not on file    Inability: Not on file  . Transportation needs:    Medical: Not on file    Non-medical: Not on file  Tobacco Use  . Smoking status: Never Smoker  . Smokeless tobacco: Never Used  Substance and Sexual Activity  . Alcohol use: Yes    Comment: Occasionally  . Drug use: No  . Sexual activity: Not on file  Lifestyle  . Physical activity:    Days per week: Not on file    Minutes per session: Not on file  . Stress: Not on file  Relationships  . Social connections:    Talks on phone: Not on file    Gets together: Not on file    Attends religious service: Not on file    Active member of club or organization: Not on file    Attends meetings of clubs or organizations: Not on file    Relationship status: Not on  file  Other Topics Concern  . Not on file  Social History Narrative   Lives at home with her husband and children.   Right-handed.   1 cup caffeine day.   Allergies  Allergen Reactions  . Meloxicam Nausea And Vomiting   Family History  Problem Relation Age of Onset  . Hyperlipidemia Mother   . Depression Mother   . Anxiety disorder Mother   . Neuropathy Mother   . Fibromyalgia Mother   . Heart disease Father   . Depression Father   . Depression Maternal Uncle   . Bipolar disorder Maternal Grandmother      Current Outpatient Medications (Cardiovascular):  .  doxazosin (CARDURA) 4 MG tablet, Take 1 tablet (4 mg total) by mouth daily.     Current Outpatient Medications (Other):  Marland Kitchen  Ascorbic Acid (VITAMIN C) 100 MG tablet, Take 100 mg by mouth daily. .  Cholecalciferol (VITAMIN D PO), Take by mouth daily. .  Misc Natural Products (TART CHERRY ADVANCED PO), Take by mouth. .  Multiple Vitamins-Minerals (MULTIVITAMIN PO), Take by mouth daily. .  Omega-3 Fatty Acids (FISH OIL PO), Take by mouth. Marland Kitchen  buPROPion (WELLBUTRIN XL) 300 MG  24 hr tablet, Take 1 tablet (300 mg total) by mouth daily. .  sertraline (ZOLOFT) 100 MG tablet, Take 1.5 tablets (150 mg total) by mouth daily. .  traZODone (DESYREL) 50 MG tablet, Take 1 tablet (50 mg total) by mouth at bedtime as needed. .  Vitamin D, Ergocalciferol, (DRISDOL) 50000 units CAPS capsule, Take 1 capsule (50,000 Units total) by mouth every 7 (seven) days. (Patient not taking: Reported on 03/12/2018)    Past medical history, social, surgical and family history all reviewed in electronic medical record.  No pertanent information unless stated regarding to the chief complaint.   Review of Systems:  No headache, visual changes, nausea, vomiting, diarrhea, constipation, dizziness, abdominal pain, skin rash, fevers, chills, night sweats, weight loss, swollen lymph nodes, body aches, joint swelling, chest pain, shortness of breath, mood  changes.  Positive muscle aches  Objective  Blood pressure 100/60, pulse 71, height 5' 2"  (1.575 m), SpO2 98 %.    General: No apparent distress alert and oriented x3 mood and affect normal, dressed appropriately.  HEENT: Pupils equal, extraocular movements intact  Respiratory: Patient's speak in full sentences and does not appear short of breath  Cardiovascular: No lower extremity edema, non tender, no erythema  Skin: Warm dry intact with no signs of infection or rash on extremities or on axial skeleton.  Abdomen: Soft nontender  Neuro: Cranial nerves II through XII are intact, neurovascularly intact in all extremities with 2+ DTRs and 2+ pulses.  Lymph: No lymphadenopathy of posterior or anterior cervical chain or axillae bilaterally.  Gait normal with good balance and coordination.  MSK:  Non tender with full range of motion and good stability and symmetric strength and tone of shoulders, elbows, wrist, , knee and ankles bilaterally.  Back Exam:  Inspection: Unremarkable  Motion: Flexion 35 deg, Extension 25 deg, Side Bending to 35 deg bilaterally,  Rotation to 35 deg bilaterally  SLR laying: Negative  XSLR laying: Negative  Palpable tenderness: Tenderness over the lateral hips bilaterally.Marland Kitchen FABER: Tightness bilaterally. Sensory change: Gross sensation intact to all lumbar and sacral dermatomes.  Reflexes: 2+ at both patellar tendons, 2+ at achilles tendons, Babinski's downgoing.  Strength at foot  Plantar-flexion: 5/5 Dorsi-flexion: 5/5 Eversion: 5/5 Inversion: 5/5  Leg strength  Quad: 5/5 Hamstring: 5/5 Hip flexor: 5/5 Hip abductors: 5/5  Gait unremarkable.  Osteopathic findings C4 flexed rotated and side bent left C7 flexed rotated and side bent left T3 extended rotated and side bent right inhaled third rib T5 extended rotated and side bent left L2 flexed rotated and side bent right Sacrum right on right     Impression and Recommendations:     This case required  medical decision making of moderate complexity. The above documentation has been reviewed and is accurate and complete Lyndal Pulley, DO       Note: This dictation was prepared with Dragon dictation along with smaller phrase technology. Any transcriptional errors that result from this process are unintentional.

## 2018-03-12 ENCOUNTER — Ambulatory Visit (INDEPENDENT_AMBULATORY_CARE_PROVIDER_SITE_OTHER): Payer: BLUE CROSS/BLUE SHIELD | Admitting: Family Medicine

## 2018-03-12 ENCOUNTER — Encounter: Payer: Self-pay | Admitting: Family Medicine

## 2018-03-12 VITALS — BP 100/60 | HR 71 | Ht 62.0 in

## 2018-03-12 DIAGNOSIS — M9903 Segmental and somatic dysfunction of lumbar region: Secondary | ICD-10-CM

## 2018-03-12 DIAGNOSIS — M9902 Segmental and somatic dysfunction of thoracic region: Secondary | ICD-10-CM | POA: Diagnosis not present

## 2018-03-12 DIAGNOSIS — M999 Biomechanical lesion, unspecified: Secondary | ICD-10-CM | POA: Diagnosis not present

## 2018-03-12 DIAGNOSIS — M9901 Segmental and somatic dysfunction of cervical region: Secondary | ICD-10-CM | POA: Diagnosis not present

## 2018-03-12 DIAGNOSIS — M9904 Segmental and somatic dysfunction of sacral region: Secondary | ICD-10-CM | POA: Diagnosis not present

## 2018-03-12 DIAGNOSIS — M7062 Trochanteric bursitis, left hip: Secondary | ICD-10-CM | POA: Diagnosis not present

## 2018-03-12 DIAGNOSIS — G8929 Other chronic pain: Secondary | ICD-10-CM

## 2018-03-12 DIAGNOSIS — M7061 Trochanteric bursitis, right hip: Secondary | ICD-10-CM | POA: Diagnosis not present

## 2018-03-12 DIAGNOSIS — M5442 Lumbago with sciatica, left side: Secondary | ICD-10-CM

## 2018-03-12 DIAGNOSIS — M9908 Segmental and somatic dysfunction of rib cage: Secondary | ICD-10-CM

## 2018-03-12 DIAGNOSIS — M5441 Lumbago with sciatica, right side: Secondary | ICD-10-CM

## 2018-03-12 NOTE — Assessment & Plan Note (Signed)
Decision today to treat with OMT was based on Physical Exam  After verbal consent patient was treated with HVLA, ME, FPR techniques in cervical, thoracic, rib lumbar and sacral areas  Patient tolerated the procedure well with improvement in symptoms  Patient given exercises, stretches and lifestyle modifications  See medications in patient instructions if given  Patient will follow up in 6 weeks

## 2018-03-12 NOTE — Patient Instructions (Addendum)
Good  To see you  Consider aquatic therapy  Ice 20 minutes 2 times daily. Usually after activity and before bed. Keep being active but more biking or elliptical may be better As long as you do well see me again in 6 ish weeks

## 2018-03-12 NOTE — Assessment & Plan Note (Signed)
Low back pain discussed with patient about posture and ergonomics.  Discussed with patient about hip abductor strengthening.  Discussed posture and ergonomics.  Patient will increase activity slowly.  Follow-up again in 6 weeks.  Worsening hip pain will consider injections again.

## 2018-03-14 ENCOUNTER — Ambulatory Visit: Payer: BLUE CROSS/BLUE SHIELD | Admitting: Psychiatry

## 2018-03-14 ENCOUNTER — Encounter: Payer: Self-pay | Admitting: Psychiatry

## 2018-03-14 DIAGNOSIS — F4310 Post-traumatic stress disorder, unspecified: Secondary | ICD-10-CM | POA: Diagnosis not present
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
## 2018-03-17 ENCOUNTER — Encounter: Payer: Self-pay | Admitting: Emergency Medicine

## 2018-03-17 DIAGNOSIS — F330 Major depressive disorder, recurrent, mild: Secondary | ICD-10-CM

## 2018-03-21 ENCOUNTER — Ambulatory Visit: Payer: BLUE CROSS/BLUE SHIELD | Admitting: Psychiatry

## 2018-03-21 ENCOUNTER — Encounter: Payer: Self-pay | Admitting: Psychiatry

## 2018-03-21 DIAGNOSIS — F4310 Post-traumatic stress disorder, unspecified: Secondary | ICD-10-CM
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
## 2018-03-27 ENCOUNTER — Ambulatory Visit: Payer: BLUE CROSS/BLUE SHIELD | Admitting: Psychiatry

## 2018-03-27 ENCOUNTER — Encounter: Payer: Self-pay | Admitting: Psychiatry

## 2018-03-27 DIAGNOSIS — F4310 Post-traumatic stress disorder, unspecified: Secondary | ICD-10-CM

## 2018-03-27 NOTE — Progress Notes (Signed)
      [facility] Progress Note  Patient ID: [name], MRN: [id number],    Date: 03/27/2018  Time Spent:  49 minutes  Treatment Type: Individual Therapy  Reported Symptoms: triggered responses, anxiety  Mental Status Exam:  Appearance:   Well Groomed     Behavior:  Appropriate  Motor:  Normal  Speech/Language:   Normal Rate  Affect:  Congruent  Mood:  normal  Thought process:  normal  Thought content:    Rumination  Sensory/Perceptual disturbances:    WNL  Orientation:  oriented to person, place and time/date  Attention:  Good  Concentration:  Good  Memory:  WNL  Fund of knowledge:   Good  Insight:    Fair  Judgment:   Good  Impulse Control:  Good   Risk Assessment: Danger to Self:  No Self-injurious Behavior: No Danger to Others: No Duty to Warn:no Physical Aggression / Violence:No  Access to Firearms a concern: No  Gang Involvement:No   Subjective: Patient was present for session.  Patient reported she was doing better after last session.  She shared she is followed through on the homework and found it to be helpful.  Went on to report that they are going to get her mother.  She went on to share her family has been very frustrating because they were supposed to come get her, but now they are saying they will have to drive to [facility] to get her mother to them.  Patient explained she still struggles with feeling like she is not enough to her family but she is working hard on her CBT skills to try and change the thought patterns.  Patient was encouraged to feel good about the positive changes she is currently making and ways to continue that progress were discussed in session.  Interventions: Cognitive Behavioral Therapy and Solution-Oriented/Positive Psychology  Diagnosis:   ICD-10-CM   1. PTSD (post-traumatic stress disorder) F43.10     Plan: 1.  Patient to continue to engage in individual counseling 2-4 times a month or as needed. 2.   Patient to identify and apply CBT, coping skills learned in session to decrease triggered responses and anxiety symptoms. 3.  Patient to contact this office, go to the local ED or call 911 if a crisis or emergency develops between visits.  Lina Sayre, [place]   This record has been created using [employer].  Chart creation errors have been sought, but may not always have been located and corrected. Such creation errors do not reflect on the standard of medical care.

## 2018-04-10 ENCOUNTER — Encounter: Payer: Self-pay | Admitting: Psychiatry

## 2018-04-10 ENCOUNTER — Ambulatory Visit: Payer: BLUE CROSS/BLUE SHIELD | Admitting: Psychiatry

## 2018-04-10 ENCOUNTER — Other Ambulatory Visit: Payer: Self-pay

## 2018-04-10 DIAGNOSIS — F4310 Post-traumatic stress disorder, unspecified: Secondary | ICD-10-CM | POA: Diagnosis not present

## 2018-04-10 NOTE — Progress Notes (Signed)
      [facility] Progress Note  Patient ID: [name], MRN: [id number],    Date: 04/10/2018  Time Spent: 49 minutes  Treatment Type: Individual Therapy  Reported Symptoms: sleep issues, anxiety  Mental Status Exam:  Appearance:   Well Groomed     Behavior:  Appropriate  Motor:  Normal  Speech/Language:   Normal Rate  Affect:  Appropriate  Mood:  normal  Thought process:  normal  Thought content:    WNL  Sensory/Perceptual disturbances:    WNL  Orientation:  oriented to person, place, time/date and situation  Attention:  Good  Concentration:  Good  Memory:  WNL  Fund of knowledge:   Good  Insight:    Good  Judgment:   Good  Impulse Control:  Good   Risk Assessment: Danger to Self:  No Self-injurious Behavior: No Danger to Others: No Duty to Warn:no Physical Aggression / Violence:No  Access to Firearms a concern: No  Gang Involvement:No   Subjective: Patient was present for session.  Patient reported that her mother is currently staying with them.  Discussed what that has been like for patient.  Patient reported she enjoys the time with her mother but is having difficulty working through any other issues.  Ways to communicate concerns with her mom without it being overwhelming for either of them were discussed with patient.  Patient reported she has been working on trying to focus her thoughts on being more positive and using some of her CBT skills.  She explains she is feeling like that is helping.  Ways to continue that progress were discussed with patient.  Patient also discussed an issue she is having with her daughter.  A plan to help her talk herself through that was addressed as well.  Interventions: Cognitive Behavioral Therapy and Solution-Oriented/Positive Psychology  Diagnosis:   ICD-10-CM   1. PTSD (post-traumatic stress disorder) F43.10     Plan: 1.  Patient to continue to engage in individual counseling 2-4 times a month or as  needed. 2.  Patient to identify and apply CBT, coping skills learned in session to decrease triggered responses and anxiety symptoms. 3.  Patient to contact this office, go to the local ED or call 911 if a crisis or emergency develops between visits.  Lina Sayre, [place]    This record has been created using [employer].  Chart creation errors have been sought, but may not always have been located and corrected. Such creation errors do not reflect on the standard of medical care.

## 2018-04-22 ENCOUNTER — Ambulatory Visit: Payer: BLUE CROSS/BLUE SHIELD | Admitting: Family Medicine

## 2018-04-24 ENCOUNTER — Ambulatory Visit (INDEPENDENT_AMBULATORY_CARE_PROVIDER_SITE_OTHER): Payer: BLUE CROSS/BLUE SHIELD | Admitting: Psychiatry

## 2018-04-24 ENCOUNTER — Other Ambulatory Visit: Payer: Self-pay

## 2018-04-24 ENCOUNTER — Encounter: Payer: Self-pay | Admitting: Psychiatry

## 2018-04-24 DIAGNOSIS — F4310 Post-traumatic stress disorder, unspecified: Secondary | ICD-10-CM

## 2018-04-24 NOTE — Progress Notes (Signed)
[facility]or[facility] Progress Note  Patient ID: [name], MRN: [id number],    Date: 04/24/2018  Time Spent: 52 minutes  Start time 10:55 AM  End time: 11:47 AM  Virtual Visit via Telephone Note I connected with patient by a video enabled telemedicine application or telephone, with their informed consent, and verified patient privacy and that I am speaking with the correct person using two identifiers.    I discussed the limitations, risks, security and privacy concerns of performing psychotherapy and management service by telephone and the availability of in person appointments. I also discussed with the patient that there may be a patient responsible charge related to this service. The patient expressed understanding and agreed to proceed.  I discussed the treatment planning with the patient. The patient was provided an opportunity to ask questions and all were answered. The patient agreed with the plan and demonstrated an understanding of the instructions.   The patient was advised to call  our office if  symptoms worsen or feel they are in a crisis state and need immediate contact.   Treatment Type: Individual Therapy  Reported Symptoms: stress/anxiety, some depression, triggered responses, nightmares  Mental Status Exam:  Appearance:   NA     Behavior:  Sharing  Motor:  na  Speech/Language:   Normal Rate  Affect:  NA  Mood:  anxious  Thought process:  normal  Thought content:    WNL  Sensory/Perceptual disturbances:    WNL  Orientation:  oriented to person, place and time/date  Attention:  Good  Concentration:  Good  Memory:  WNL  Fund of knowledge:   Good  Insight:    Good  Judgment:   Good  Impulse Control:  Good   Risk Assessment: Danger to Self:  No Self-injurious Behavior: No Danger to Others: No Duty to Warn:no Physical Aggression / Violence:No  Access to Firearms a concern: No  Gang Involvement:No   Subjective: Met with patient via  phone.  She shared that due to her children all having to do online school there was not enough Internet access for her to do virtual sessions.  Patient went on to share she has had several different triggers recently.  She explained that she is having difficulty with a friend who has been going through a lot and very accusatory towards her.  Patient was allowed to discuss the situation and encouraged to figure out what she felt she wanted her friend hear.  I did she is to communicate those concerns with her friend were discussed with patient and plans were developed.  Patient also reported that having her mother around is enjoyable yet very triggering as they have discussed different things from her childhood.  Patient was able to identify the part of her that seems to be triggered the most.  CBT skills to talk herself through those triggered moments with that age were discussed with patient and plans were developed.  Patient was able to acknowledge the fact it is difficult for her to remind herself of the truth but recognizes that when she is able to do so things do seem better.  Patient was encouraged to continue writing out the fact that she needs to so she can read them to herself regularly.  Interventions: Cognitive Behavioral Therapy and Solution-Oriented/Positive Psychology  Diagnosis:   ICD-10-CM   1. PTSD (post-traumatic stress disorder) F43.10     Plan: 1.  Patient to continue to engage in individual counseling 2-4 times a  month or as needed. 2.  Patient to identify and apply CBT, coping skills learned in session to decrease triggered responses and anxiety symptoms. 3.  Patient to contact this office, go to the local ED or call 911 if a crisis or emergency develops between visits.  Lina Sayre, [place]    This record has been created using [employer].  Chart creation errors have been sought, but may not always have been located and corrected. Such creation errors do not reflect on the  standard of medical care.

## 2018-04-30 ENCOUNTER — Other Ambulatory Visit: Payer: Self-pay

## 2018-04-30 ENCOUNTER — Ambulatory Visit (INDEPENDENT_AMBULATORY_CARE_PROVIDER_SITE_OTHER): Payer: BLUE CROSS/BLUE SHIELD | Admitting: Psychiatry

## 2018-04-30 DIAGNOSIS — F4310 Post-traumatic stress disorder, unspecified: Secondary | ICD-10-CM | POA: Diagnosis not present

## 2018-04-30 NOTE — Progress Notes (Signed)
      [facility] Counselor/Therap[facility] Progress Note  Patient ID: [name], MRN: [id number],    Date: 04/30/2018  Time Spent: 47 minutes Start time 11:15 end time 12:02 PM I connected with patient by a video enabled telemedicine application or telephone, with their informed consent, and verified patient privacy and that I am speaking with the correct person using two identifiers.  I was located at my hand and patient at her home.  Treatment Type: Individual Therapy  Reported Symptoms: triggered responses, anxiety, sleep issues  Mental Status Exam:  Appearance:   NA     Behavior:  Sharing  Motor:  Normal  Speech/Language:   Normal Rate  Affect:  NA  Mood:  normal  Thought process:  normal  Thought content:    WNL  Sensory/Perceptual disturbances:    WNL  Orientation:  oriented to person, place, time/date and situation  Attention:  Good  Concentration:  Good  Memory:  WNL  Fund of knowledge:   Good  Insight:    Good  Judgment:   Good  Impulse Control:  Good   Risk Assessment: Danger to Self:  No Self-injurious Behavior: No Danger to Others: No Duty to Warn:no Physical Aggression / Violence:No  Access to Firearms a concern: No  Gang Involvement:No   Subjective: Met with patient via phone.  She shared she was unable to connect on WebEx due to Internet at her home.  Patient went on to explain that she was doing some better overall.  She has continued to memories from the past that were disturbing and she is talking to her mother about them.  Patient shared that as she communicates her concerns with her mother it is seeming to give her an opportunity to let some of them go patient was encouraged to even think of a visual of being able to put the memories in his scrapbook or to visualize letting them go and not having to carry the pain associated to them any longer.  Patient was encouraged to find ways to enjoy her mother while she is present as well and to reflect on some of  the positive things that can happen during this very difficult time.  Patient agreed to continue working on her positive self talk and to recognize more when she gets triggered to see if there is anything that still needs to be resolved in later sessions.  Interventions: Cognitive Behavioral Therapy and Solution-Oriented/Positive Psychology  Diagnosis:   ICD-10-CM   1. PTSD (post-traumatic stress disorder) F43.10     Plan: 1.  Patient to continue to engage in individual counseling 2-4 times a month or as needed. 2.  Patient to identify and apply CBT, coping skills learned in session to decrease triggered responses and anxiety symptoms. 3.  Patient to contact this office, go to the local ED or call 911 if a crisis or emergency develops between visits.  Lina Sayre, [place]   This record has been created using [employer].  Chart creation errors have been sought, but may not always have been located and corrected. Such creation errors do not reflect on the standard of medical care.

## 2018-05-02 ENCOUNTER — Encounter: Payer: Self-pay | Admitting: Psychiatry

## 2018-05-07 ENCOUNTER — Other Ambulatory Visit: Payer: Self-pay

## 2018-05-07 ENCOUNTER — Ambulatory Visit (INDEPENDENT_AMBULATORY_CARE_PROVIDER_SITE_OTHER): Payer: BLUE CROSS/BLUE SHIELD | Admitting: Psychiatry

## 2018-05-07 DIAGNOSIS — F4310 Post-traumatic stress disorder, unspecified: Secondary | ICD-10-CM | POA: Diagnosis not present

## 2018-05-07 NOTE — Progress Notes (Signed)
[facility] Counselor/Therapist Progress Note  Patient ID: [name], MRN: [id number],    Date: 05/07/2018  Time Spent: 50 minutes  Start time 9:00 a.m. end time 9:50 AM I connected with patient by a video enabled telemedicine application or telephone, with their informed consent, and verified patient privacy and that I am speaking with the correct person using two identifiers.  I was located at home and patient at her home.  Treatment Type: Individual Therapy  Reported Symptoms: triggered responses, anxiety, frustration  Mental Status Exam:  Appearance:   NA     Behavior:  Sharing  Motor:  NA  Speech/Language:   Normal Rate  Affect:  NA  Mood:  anxious  Thought process:  normal  Thought content:    WNL  Sensory/Perceptual disturbances:    WNL  Orientation:  oriented to person, place, time/date and situation  Attention:  Good  Concentration:  Good  Memory:  WNL  Fund of knowledge:   Good  Insight:    Good  Judgment:   Good  Impulse Control:  Good   Risk Assessment: Danger to Self:  No Self-injurious Behavior: No Danger to Others: No Duty to Warn:no Physical Aggression / Violence:No  Access to Firearms a concern: No  Gang Involvement:No   Subjective: Met with patient via phone.  Patient reported her Internet access is still limited so she wants to utilize the phone.  Patient went on to share that it had been a very difficult week with her husband.  She shared that he is still home working and that the kids are there and there have been difficult moments between them.  Patient explained she feels like his irritability is getting worse and that is triggering her memories of her father.  Patient was encouraged to try and think through how things have developed in their relationship and to recognize that currently things are extremely stressful on everybody.  Different ways that she could try and focus on his positives and encouraged him were discussed with patient.   Also ways to bring up the issue to have a discussion with him were discussed as well.  Patient was encouraged to utilize her coping skills so that she can stay at a good place rather than getting so triggered when he is irritable is specially with the kids which seems to trigger her own issues.  Patient was encouraged to think through different activities at their home that they could do that would help everybody feel more positive and also would release some stress internally for the whole family.  Patient was able to come up with some plans that she felt would be helpful and agreed to try and practice over the next week.  Interventions: Cognitive Behavioral Therapy and Solution-Oriented/Positive Psychology  Diagnosis:   ICD-10-CM   1. PTSD (post-traumatic stress disorder) F43.10     Plan: 1.  Patient to continue to engage in individual counseling 2-4 times a month or as needed. 2.  Patient to identify and apply CBT, coping skills learned in session to decrease triggered responses and anxiety symptoms. 3.  Patient to contact this office, go to the local ED or call 911 if a crisis or emergency develops between visits.  Lina Sayre, [facility]  This record has been created using [employer].  Chart creation errors have been sought, but may not always have been located and corrected. Such creation errors do not reflect on the standard of medical care.

## 2018-05-09 ENCOUNTER — Encounter: Payer: Self-pay | Admitting: Psychiatry

## 2018-05-14 ENCOUNTER — Ambulatory Visit (INDEPENDENT_AMBULATORY_CARE_PROVIDER_SITE_OTHER): Payer: BLUE CROSS/BLUE SHIELD | Admitting: Psychiatry

## 2018-05-14 ENCOUNTER — Other Ambulatory Visit: Payer: Self-pay

## 2018-05-14 ENCOUNTER — Encounter: Payer: Self-pay | Admitting: Psychiatry

## 2018-05-14 DIAGNOSIS — F4310 Post-traumatic stress disorder, unspecified: Secondary | ICD-10-CM | POA: Diagnosis not present
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
## 2018-05-27 ENCOUNTER — Encounter: Payer: Self-pay | Admitting: Psychiatry

## 2018-05-27 ENCOUNTER — Other Ambulatory Visit: Payer: Self-pay

## 2018-05-27 ENCOUNTER — Ambulatory Visit (INDEPENDENT_AMBULATORY_CARE_PROVIDER_SITE_OTHER): Payer: BLUE CROSS/BLUE SHIELD | Admitting: Psychiatry

## 2018-05-27 DIAGNOSIS — F4312 Post-traumatic stress disorder, chronic: Secondary | ICD-10-CM | POA: Diagnosis not present
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
## 2018-06-10 ENCOUNTER — Other Ambulatory Visit: Payer: Self-pay

## 2018-06-10 ENCOUNTER — Ambulatory Visit (INDEPENDENT_AMBULATORY_CARE_PROVIDER_SITE_OTHER): Payer: BLUE CROSS/BLUE SHIELD | Admitting: Psychiatry

## 2018-06-10 ENCOUNTER — Encounter: Payer: Self-pay | Admitting: Psychiatry

## 2018-06-10 DIAGNOSIS — F4312 Post-traumatic stress disorder, chronic: Secondary | ICD-10-CM
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
## 2018-06-17 ENCOUNTER — Other Ambulatory Visit: Payer: Self-pay

## 2018-06-17 ENCOUNTER — Ambulatory Visit (INDEPENDENT_AMBULATORY_CARE_PROVIDER_SITE_OTHER): Payer: BLUE CROSS/BLUE SHIELD | Admitting: Psychiatry

## 2018-06-17 ENCOUNTER — Encounter: Payer: Self-pay | Admitting: Psychiatry

## 2018-06-17 DIAGNOSIS — F4310 Post-traumatic stress disorder, unspecified: Secondary | ICD-10-CM | POA: Diagnosis not present
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
## 2018-07-01 ENCOUNTER — Encounter: Payer: Self-pay | Admitting: Psychiatry

## 2018-07-01 ENCOUNTER — Other Ambulatory Visit: Payer: Self-pay

## 2018-07-01 ENCOUNTER — Ambulatory Visit (INDEPENDENT_AMBULATORY_CARE_PROVIDER_SITE_OTHER): Payer: BC Managed Care – PPO | Admitting: Psychiatry

## 2018-07-01 DIAGNOSIS — F4310 Post-traumatic stress disorder, unspecified: Secondary | ICD-10-CM

## 2018-07-01 NOTE — Progress Notes (Signed)
[facility] Counselor/Therapist Progress Note  Patient ID: [name], MRN: [id number],    Date: 07/01/2018  Time Spent: 52 minutes start time 9:04AM end time end time 9:56 AM Virtual Visit via Telephone Note Connected with patient by a video enabled telemedicine/telehealth application or telephone, with their informed consent, and verified patient privacy and that I am speaking with the correct person using two identifiers. I discussed the limitations, risks, security and privacy concerns of performing psychotherapy and management service by telephone and the availability of in person appointments. I also discussed with the patient that there may be a patient responsible charge related to this service. The patient expressed understanding and agreed to proceed. I discussed the treatment planning with the patient. The patient was provided an opportunity to ask questions and all were answered. The patient agreed with the plan and demonstrated an understanding of the instructions. The patient was advised to call  our office if  symptoms worsen or feel they are in a crisis state and need immediate contact.   Therapist Location: office Patient Location: home    Treatment Type: Individual Therapy  Reported Symptoms: frustration, anxiety, triggered responses, sleep issues  Mental Status Exam:  Appearance:   NA     Behavior:  Sharing  Motor:  NA  Speech/Language:   Normal Rate  Affect:  NA  Mood:  anxious  Thought process:  normal  Thought content:    WNL  Sensory/Perceptual disturbances:    WNL  Orientation:  oriented to person, place, time/date and situation  Attention:  Good  Concentration:  Good  Memory:  WNL  Fund of knowledge:   Good  Insight:    Fair  Judgment:   Good  Impulse Control:  Good   Risk Assessment: Danger to Self:  No Self-injurious Behavior: No Danger to Others: No Duty to Warn:no Physical Aggression / Violence:No  Access to Firearms a concern: No   Gang Involvement:No   Subjective: Met with patient via phone.  Patient reported she had been struggling over the past week and was not sure why.  Patient was encouraged to share the recent incidents that have occurred with her and what contact she has had with her biological parents.  Patient was able to recognize that she had a negative interaction with her father that felt very similar to the way things have been where she is not as important as her brother.  Patient was able to identify that seem to trigger her emotional swings.  Patient was encouraged to identify what she needed to remind herself, and what were the truth about her and the situation.  Patient was able to recognize that there is a huge pattern with her dad and women in general and that she is going to have to remember that when interacting with him.  Ways for her to remember that in future sessions were discussed with patient and plans were developed in session.  Patient was also able to identify that the connection with her mother not standing up for her when it came to her dad and her dad not standing up for her when it came to anybody who would harm to her with getting upset with her husband if she feels he does not take up for her with his family.  Patient was able to recognize sometimes she overreacts and takes things personal and was not sure why but now she does understand.  Ways to deal with that issue were addressed as well.  Interventions: Cognitive Behavioral Therapy and Solution-Oriented/Positive Psychology  Diagnosis:   ICD-10-CM   1. PTSD (post-traumatic stress disorder) F43.10     Plan: 1.  Patient to continue to engage in individual counseling 2-4 times a month or as needed. 2.  Patient to identify and apply CBT, coping skills learned in session to decrease triggered responses and anxiety symptoms. 3.  Patient to contact this office, go to the local ED or call 911 if a crisis or emergency develops between  visits.  Lina Sayre, [facility]  This record has been created using [employer].  Chart creation errors have been sought, but may not always have been located and corrected. Such creation errors do not reflect on the standard of medical care.

## 2018-07-16 ENCOUNTER — Encounter: Payer: Self-pay | Admitting: Psychiatry

## 2018-07-16 ENCOUNTER — Encounter (INDEPENDENT_AMBULATORY_CARE_PROVIDER_SITE_OTHER): Payer: Self-pay

## 2018-07-16 ENCOUNTER — Ambulatory Visit: Payer: BC Managed Care – PPO | Admitting: Psychiatry

## 2018-07-16 ENCOUNTER — Ambulatory Visit (INDEPENDENT_AMBULATORY_CARE_PROVIDER_SITE_OTHER): Payer: BC Managed Care – PPO | Admitting: Psychiatry

## 2018-07-16 ENCOUNTER — Other Ambulatory Visit: Payer: Self-pay

## 2018-07-16 DIAGNOSIS — F330 Major depressive disorder, recurrent, mild: Secondary | ICD-10-CM

## 2018-07-16 DIAGNOSIS — F4312 Post-traumatic stress disorder, chronic: Secondary | ICD-10-CM

## 2018-07-16 DIAGNOSIS — F5105 Insomnia due to other mental disorder: Secondary | ICD-10-CM

## 2018-07-16 DIAGNOSIS — F4310 Post-traumatic stress disorder, unspecified: Secondary | ICD-10-CM

## 2018-07-16 DIAGNOSIS — F99 Mental disorder, not otherwise specified: Secondary | ICD-10-CM | POA: Diagnosis not present

## 2018-07-16 MED ORDER — DOXAZOSIN MESYLATE 4 MG PO TABS: 4.0000 mg | ORAL_TABLET | Freq: Every day | ORAL | 5 refills | Status: DC

## 2018-07-16 MED ORDER — DOXEPIN HCL 3 MG PO TABS: ORAL_TABLET | ORAL | 0 refills | Status: DC

## 2018-07-16 MED ORDER — TRAZODONE HCL 50 MG PO TABS: 50.0000 mg | ORAL_TABLET | Freq: Every evening | ORAL | 5 refills | Status: DC | PRN

## 2018-07-16 MED ORDER — SERTRALINE HCL 100 MG PO TABS: 150.0000 mg | ORAL_TABLET | Freq: Every day | ORAL | 5 refills | Status: DC

## 2018-07-16 MED ORDER — BUPROPION HCL ER (XL) 300 MG PO TB24: 300.0000 mg | ORAL_TABLET | Freq: Every day | ORAL | 5 refills | Status: DC
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
## 2018-07-16 NOTE — Progress Notes (Signed)
[name] [id number] 07/24/1975 42 y.o.  Subjective:   Patient ID:  [name] is a 43 y.o. (DOB 10-06-1975) female.  Chief Complaint:  Chief Complaint  Patient presents with  . Insomnia  . Follow-up    h/o Anxiety, Depression    HPI [name] presents to the office today for follow-up of anxiety and depression. She reports "I'm not sleeping very good." Reports difficulty falling asleep and that it can take a few hours to fall asleep. Reports that she is been trying to go to bed and wake up at the same time everyday. Reports that she has been taking Trazodone at different times at night. She reports intermittent awakenings throughout the night. Reports a few occ of early morning awakenings. She reports that she is unable to estimate sleep quantity. Reports that she has not been having frequent nightmares and rarely having dreams. Reports back and hip pain interrupt sleep.  She reports that her anxiety has been "pretty good." Reports that moods have been "even" and denies any recent depressed or irritability. Reports energy has been low, particularly in the morning. Motivation has been adequate.  Reports that she feels "dopey in the morning." Reports some occ difficulty with concentration, typically in the morning. She reports that appetite has been stable. Denies SI.   Reports that she has been intermittent fasting at times.    Past Psychiatric Medication Trials: Effexor XR Wellbutrin Trazodone- Reports that she has taken it intermittently. Reports that 25 mg is ineffective. Sertraline Doxazosin- helpful for nightmares   Review of Systems:  Review of Systems  Constitutional: Positive for fatigue.  Musculoskeletal: Positive for back pain. Negative for gait problem.       Hip pain  Neurological: Negative for tremors.  Psychiatric/Behavioral:       Please refer to HPI   Plans to have a yearly exam.  Medications: I have reviewed the patient's current  medications.  Current Outpatient Medications  Medication Sig Dispense Refill  . Cholecalciferol (VITAMIN D PO) Take by mouth daily.    Marland Kitchen doxazosin (CARDURA) 4 MG tablet Take 1 tablet (4 mg total) by mouth daily. 30 tablet 5  . Misc Natural Products (APPLE CIDER VINEGAR DIET) TABS Take by mouth.    . Multiple Vitamins-Minerals (MULTIVITAMIN PO) Take by mouth daily.    . Omega-3 Fatty Acids (FISH OIL PO) Take by mouth.    . Vitamin D, Ergocalciferol, (DRISDOL) 50000 units CAPS capsule Take 1 capsule (50,000 Units total) by mouth every 7 (seven) days. 12 capsule 0  . buPROPion (WELLBUTRIN XL) 300 MG 24 hr tablet Take 1 tablet (300 mg total) by mouth daily for 30 days. 30 tablet 5  . Doxepin HCl (SILENOR) 3 MG TABS Take 3-6 mg po QHS 16 tablet 0  . sertraline (ZOLOFT) 100 MG tablet Take 1.5 tablets (150 mg total) by mouth daily for 30 days. 45 tablet 5  . traZODone (DESYREL) 50 MG tablet Take 1 tablet (50 mg total) by mouth at bedtime as needed for up to 30 days. 30 tablet 5   No current facility-administered medications for this visit.     Medication Side Effects: None  Allergies:  Allergies  Allergen Reactions  . Meloxicam Nausea And Vomiting    Past Medical History:  Diagnosis Date  . Anxiety   . Back pain   . Chronic hip pain   . Depression   . GERD (gastroesophageal reflux disease)     Family History  Problem Relation Age of Onset  .  Hyperlipidemia Mother   . Depression Mother   . Anxiety disorder Mother   . Neuropathy Mother   . Fibromyalgia Mother   . Heart disease Father   . Depression Father   . Depression Maternal Uncle   . Bipolar disorder Maternal Grandmother     Social History   Socioeconomic History  . Marital status: Married    Spouse name: Not on file  . Number of children: 3  . Years of education: 2 years of college  . Highest education level: Not on file  Occupational History  . Occupation: Agricultural engineer  Social Needs  . Financial resource strain:  Not on file  . Food insecurity    Worry: Not on file    Inability: Not on file  . Transportation needs    Medical: Not on file    Non-medical: Not on file  Tobacco Use  . Smoking status: Never Smoker  . Smokeless tobacco: Never Used  Substance and Sexual Activity  . Alcohol use: Yes    Comment: Occasionally  . Drug use: No  . Sexual activity: Not on file  Lifestyle  . Physical activity    Days per week: Not on file    Minutes per session: Not on file  . Stress: Not on file  Relationships  . Social Herbalist on phone: Not on file    Gets together: Not on file    Attends religious service: Not on file    Active member of club or organization: Not on file    Attends meetings of clubs or organizations: Not on file    Relationship status: Not on file  . Intimate partner violence    Fear of current or ex partner: Not on file    Emotionally abused: Not on file    Physically abused: Not on file    Forced sexual activity: Not on file  Other Topics Concern  . Not on file  Social History Narrative   Lives at home with her husband and children.   Right-handed.   1 cup caffeine day.    Past Medical History, Surgical history, Social history, and Family history were reviewed and updated as appropriate.   Please see review of systems for further details on the patient's review from today.   Objective:   Physical Exam:  There were no vitals taken for this visit.  Physical Exam Constitutional:      General: She is not in acute distress.    Appearance: She is well-developed.  Musculoskeletal:        General: No deformity.  Neurological:     Mental Status: She is alert and oriented to person, place, and time.     Coordination: Coordination normal.  Psychiatric:        Attention and Perception: Attention and perception normal. She does not perceive auditory or visual hallucinations.        Mood and Affect: Mood is not depressed. Affect is not labile, blunt, angry or  inappropriate.        Speech: Speech normal.        Behavior: Behavior normal.        Thought Content: Thought content normal. Thought content does not include homicidal or suicidal ideation. Thought content does not include homicidal or suicidal plan.        Cognition and Memory: Cognition and memory normal.        Judgment: Judgment normal.     Comments: Mood presents as less anxious  compared to past exams. Insight intact. No delusions.      Lab Review:  No results found for: NA, K, CL, CO2, GLUCOSE, BUN, CREATININE, CALCIUM, PROT, ALBUMIN, AST, ALT, ALKPHOS, BILITOT, GFRNONAA, GFRAA     Component Value Date/Time   WBC 15.8 (H) 04/08/2007 0338   RBC 4.21 04/08/2007 0338   HGB 13.4 04/08/2007 0338   HCT 39.5 04/08/2007 0338   PLT 291 04/08/2007 0338   MCV 93.9 04/08/2007 0338   MCHC 33.8 04/08/2007 0338   RDW 16.5 (H) 04/08/2007 0338    No results found for: POCLITH, LITHIUM   No results found for: PHENYTOIN, PHENOBARB, VALPROATE, CBMZ   .res Assessment: Plan:   Agree with plan to follow-up with medical provider regarding possible medical causes for fatigue, particularly since patient is currently denying other depressive signs and symptoms.  Discussed that chronic poor sleep quality is also likely contributing to fatigue.  Discussed potential benefits, risks, and side effects of Silenor.  Discussed that Silenor could be alternated with trazodone or used in place of trazodone if it is helpful for her insomnia without causing excessive somnolence. Continue sertraline 150 mg daily for anxiety and depression. Continue Wellbutrin XL 300 mg daily for depression. May continue to use trazodone 50 mg 1/2-1 tab p.o. nightly as needed insomnia. Continue doxazosin 4 mg at bedtime for nightmares. Patient to follow-up with this provider in 6 months or sooner if clinically indicated.  Please see After Visit Summary for patient specific instructions.  Future Appointments  Date Time  Provider Reserve  07/22/2018 11:00 AM Lina Sayre, [facility] None  07/28/2018  1:00 PM Lyndal Pulley, DO [facility]  08/05/2018  2:00 PM Lina Sayre, [facility] CP[facility]CP None  08/19/2018  2:00 PM Lina Sayre, [facility] CP-CP None  01/15/2019 12:45 PM Thayer Headings, [facility] CP-CP None    No orders of the defined types were placed in this encounter.   -------------------------------

## 2018-07-22 ENCOUNTER — Ambulatory Visit: Payer: BC Managed Care – PPO | Admitting: Psychiatry

## 2018-07-28 ENCOUNTER — Ambulatory Visit: Payer: BLUE CROSS/BLUE SHIELD | Admitting: Family Medicine

## 2018-07-31 ENCOUNTER — Ambulatory Visit: Payer: BLUE CROSS/BLUE SHIELD | Admitting: Family Medicine

## 2018-08-05 ENCOUNTER — Telehealth: Payer: Self-pay | Admitting: Psychiatry

## 2018-08-05 ENCOUNTER — Ambulatory Visit (INDEPENDENT_AMBULATORY_CARE_PROVIDER_SITE_OTHER): Payer: BC Managed Care – PPO | Admitting: Psychiatry

## 2018-08-05 ENCOUNTER — Encounter: Payer: Self-pay | Admitting: Psychiatry

## 2018-08-05 ENCOUNTER — Other Ambulatory Visit: Payer: Self-pay

## 2018-08-05 DIAGNOSIS — F4312 Post-traumatic stress disorder, chronic: Secondary | ICD-10-CM | POA: Diagnosis not present

## 2018-08-05 DIAGNOSIS — F5105 Insomnia due to other mental disorder: Secondary | ICD-10-CM

## 2018-08-05 MED ORDER — DOXEPIN HCL 3 MG PO TABS: 3.0000 mg | ORAL_TABLET | Freq: Every day | ORAL | 5 refills | Status: DC

## 2018-08-05 NOTE — Telephone Encounter (Signed)
Pt reported to therapist that Silenor 3 mg po QHS is effective and would like script sent to pharmacy. Script sent.
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
## 2018-08-19 ENCOUNTER — Encounter: Payer: Self-pay | Admitting: Psychiatry

## 2018-08-19 ENCOUNTER — Ambulatory Visit (INDEPENDENT_AMBULATORY_CARE_PROVIDER_SITE_OTHER): Payer: BC Managed Care – PPO | Admitting: Psychiatry

## 2018-08-19 ENCOUNTER — Other Ambulatory Visit: Payer: Self-pay

## 2018-08-19 DIAGNOSIS — F4312 Post-traumatic stress disorder, chronic: Secondary | ICD-10-CM

## 2018-08-19 NOTE — Progress Notes (Signed)
[facility] Counselor/Therapist Progress Note  Patient ID: [name], MRN: [id number],    Date: 08/19/2018  Time Spent: 50 minutes start time 2:05 PM end time 2:55 PM  Treatment Type: Individual Therapy  Reported Symptoms: sleep issues, guilty, nightmares but not as bad, and flashbacks   Mental Status Exam:  Appearance:   Casual     Behavior:  Appropriate  Motor:  Normal  Speech/Language:   Normal Rate  Affect:  Appropriate  Mood:  anxious  Thought process:  normal  Thought content:    WNL  Sensory/Perceptual disturbances:    WNL  Orientation:  oriented to person, place, time/date and situation  Attention:  Good  Concentration:  Good  Memory:  WNL  Fund of knowledge:   Good  Insight:    Good  Judgment:   Good  Impulse Control:  Good   Risk Assessment: Danger to Self:  No Self-injurious Behavior: No Danger to Others: No Duty to Warn:no Physical Aggression / Violence:No  Access to Firearms a concern: No  Gang Involvement:No   Subjective: Patient was present for session.  Patient reported she was able to skip the graduation of her nephew due to the coronavirus.  But she is feeling guilty over not being there.  Discussed what was causing the guilt for her.  Patient was able to acknowledge she is concerned that something is going to happen to her grandmother same and she will not have closure on the relationship.  Patient explained that she is told she is her grandmother's favorite but grandmother is a very difficult woman and does not have a filter.  She explained that she has been having nightmares that have her grandmother in them as well. patient shared there have been different things including she is fat that her grandmother has told her and that makes it very difficult to interact with her.  Patient shared she is also feeling very frustrated with her weight as she is gotten older and her body has changed and direction she would not have wanted to.  Discussed the  importance of focusing on being healthy and recognizing that she has to work on taking care of things as she can due to her chronic pain issues.  Different strategies to help her self talk through making positive choices were discussed with patient.  Patient reported she was able to have a positive moment with her husband.  She shared she was able to be naked in front of him for a few minutes which was a huge step for her.  What she did to talk her self through that were discussed in session and the importance of continuing to try and work on that issue was addressed with patient.  Patient reported her husband seem to be very positive which made it easier and she is hoping to continue working on that to deal with her PTSD issues.  Patient was encouraged to write down different memories she has of her grandmother that still are disturbing to be addressed in future sessions so when she can go for a visit she is able to get the closure she needs before anything happens to her grandmother.  Interventions: Cognitive Behavioral Therapy and Solution-Oriented/Positive Psychology  Diagnosis:   ICD-10-CM   1. Chronic post-traumatic stress disorder (PTSD)  F43.12     Plan: 1.  Patient to continue to engage in individual counseling 2-4 times a month or as needed. 2.  Patient to identify and apply CBT, coping skills  learned in session to decrease triggered responses and anxiety symptoms. 3.  Patient to contact this office, go to the local ED or call 911 if a crisis or emergency develops between visits.  Lina Sayre, [facility]  This record has been created using [employer].  Chart creation errors have been sought, but may not always have been located and corrected. Such creation errors do not reflect on the standard of medical care.

## 2018-08-27 ENCOUNTER — Ambulatory Visit (INDEPENDENT_AMBULATORY_CARE_PROVIDER_SITE_OTHER): Payer: BC Managed Care – PPO | Admitting: Family Medicine

## 2018-08-27 ENCOUNTER — Other Ambulatory Visit: Payer: Self-pay

## 2018-08-27 ENCOUNTER — Encounter: Payer: Self-pay | Admitting: Family Medicine

## 2018-08-27 ENCOUNTER — Ambulatory Visit: Payer: Self-pay

## 2018-08-27 VITALS — BP 102/74 | HR 77 | Ht 62.0 in | Wt 159.0 lb

## 2018-08-27 DIAGNOSIS — M999 Biomechanical lesion, unspecified: Secondary | ICD-10-CM

## 2018-08-27 DIAGNOSIS — M7062 Trochanteric bursitis, left hip: Secondary | ICD-10-CM | POA: Diagnosis not present

## 2018-08-27 DIAGNOSIS — M25552 Pain in left hip: Secondary | ICD-10-CM

## 2018-08-27 DIAGNOSIS — M7061 Trochanteric bursitis, right hip: Secondary | ICD-10-CM

## 2018-08-27 NOTE — Patient Instructions (Addendum)
Good to see you Ice 20 min 2x a day Injected both hips today along with OMT See me again in 6-8 weeks

## 2018-08-27 NOTE — Progress Notes (Signed)
[name] [address], [place] [address] Phone: [phone] Subjective:   [name], am serving as a scribe for Dr. Hulan Saas.    CC: Bilateral hip pain  [id number]    03/12/2018: Low back pain discussed with patient about posture and ergonomics.  Discussed with patient about hip abductor strengthening.  Discussed posture and ergonomics.  Patient will increase activity slowly.  Follow-up again in 6 weeks.  Worsening hip pain will consider injections again.  Update 08/27/2018: [name] is a 43 y.o. female coming in with complaint of back and hip pain, L>R. Pain in on lateral hip. Unable to sleep due to pain. Used IBU prn. Did try getting a new mattress which did help.  Patient states that unfortunately the pain is starting to affect her every night.  Waking her up.  Feels like she is only getting 3 to 4 hours of sleep.       Past Medical History:  Diagnosis Date  . Anxiety   . Back pain   . Chronic hip pain   . Depression   . GERD (gastroesophageal reflux disease)    Past Surgical History:  Procedure Laterality Date  . FOOT SURGERY    . MOUTH SURGERY     Social History   Socioeconomic History  . Marital status: Married    Spouse name: Not on file  . Number of children: 3  . Years of education: 2 years of college  . Highest education level: Not on file  Occupational History  . Occupation: Agricultural engineer  Social Needs  . Financial resource strain: Not on file  . Food insecurity    Worry: Not on file    Inability: Not on file  . Transportation needs    Medical: Not on file    Non-medical: Not on file  Tobacco Use  . Smoking status: Never Smoker  . Smokeless tobacco: Never Used  Substance and Sexual Activity  . Alcohol use: Yes    Comment: Occasionally  . Drug use: No  . Sexual activity: Not on file  Lifestyle  . Physical activity    Days per week: Not on file    Minutes per session: Not on file  .  Stress: Not on file  Relationships  . Social Herbalist on phone: Not on file    Gets together: Not on file    Attends religious service: Not on file    Active member of club or organization: Not on file    Attends meetings of clubs or organizations: Not on file    Relationship status: Not on file  Other Topics Concern  . Not on file  Social History Narrative   Lives at home with her husband and children.   Right-handed.   1 cup caffeine day.   Allergies  Allergen Reactions  . Meloxicam Nausea And Vomiting   Family History  Problem Relation Age of Onset  . Hyperlipidemia Mother   . Depression Mother   . Anxiety disorder Mother   . Neuropathy Mother   . Fibromyalgia Mother   . Heart disease Father   . Depression Father   . Depression Maternal Uncle   . Bipolar disorder Maternal Grandmother      Current Outpatient Medications (Cardiovascular):  .  doxazosin (CARDURA) 4 MG tablet, Take 1 tablet (4 mg total) by mouth daily.     Current Outpatient Medications (Other):  Marland Kitchen  Cholecalciferol (VITAMIN D PO), Take  by mouth daily. .  Doxepin HCl (SILENOR) 3 MG TABS, 1 tablet (3 mg total) by Per post-pyloric tube route at bedtime. .  Misc Natural Products (APPLE CIDER VINEGAR DIET) TABS, Take by mouth. .  Multiple Vitamins-Minerals (MULTIVITAMIN PO), Take by mouth daily. .  Omega-3 Fatty Acids (FISH OIL PO), Take by mouth. .  Vitamin D, Ergocalciferol, (DRISDOL) 50000 units CAPS capsule, Take 1 capsule (50,000 Units total) by mouth every 7 (seven) days. Marland Kitchen  buPROPion (WELLBUTRIN XL) 300 MG 24 hr tablet, Take 1 tablet (300 mg total) by mouth daily for 30 days. Marland Kitchen  sertraline (ZOLOFT) 100 MG tablet, Take 1.5 tablets (150 mg total) by mouth daily for 30 days. .  traZODone (DESYREL) 50 MG tablet, Take 1 tablet (50 mg total) by mouth at bedtime as needed for up to 30 days.    Past medical history, social, surgical and family history all reviewed in electronic medical  record.  No pertanent information unless stated regarding to the chief complaint.   Review of Systems:  No headache, visual changes, nausea, vomiting, diarrhea, constipation, dizziness, abdominal pain, skin rash, fevers, chills, night sweats, weight loss, swollen lymph nodes, body aches, joint swelling, muscle aches, chest pain, shortness of breath, mood changes.   Objective  Blood pressure 102/74, pulse 77, height 5' 2"  (1.575 m), weight 159 lb (72.1 kg), SpO2 98 %.    General: No apparent distress alert and oriented x3 mood and affect normal, dressed appropriately.  HEENT: Pupils equal, extraocular movements intact  Respiratory: Patient's speak in full sentences and does not appear short of breath  Cardiovascular: No lower extremity edema, non tender, no erythema  Skin: Warm dry intact with no signs of infection or rash on extremities or on axial skeleton.  Abdomen: Soft nontender  Neuro: Cranial nerves II through XII are intact, neurovascularly intact in all extremities with 2+ DTRs and 2+ pulses.  Lymph: No lymphadenopathy of posterior or anterior cervical chain or axillae bilaterally.  Gait normal with good balance and coordination.  MSK:  Non tender with full range of motion and good stability and symmetric strength and tone of shoulders, elbows, wrist, , knee and ankles bilaterally.  Back exam has some mild loss of lordosis.  Patient does have some mild weakness of the core strength compared to his exam.  Patient does have some tightness with Corky Sox test right greater than left.  Negative straight leg test.  Severe tenderness to palpation over the greater trochanteric area bilaterally left greater than right.  Osteopathic findings C6 flexed rotated and side bent left T3 extended rotated and side bent right inhaled third rib T6 extended rotated and side bent left L2 flexed rotated and side bent right Sacrum right on right   Procedure: Real-time Ultrasound Guided Injection of right  greater trochanteric bursitis secondary to patient's body habitus Device: GE Logiq Q7 Ultrasound guided injection is preferred based studies that show increased duration, increased effect, greater accuracy, decreased procedural pain, increased response rate, and decreased cost with ultrasound guided versus blind injection.  Verbal informed consent obtained.  Time-out conducted.  Noted no overlying erythema, induration, or other signs of local infection.  Skin prepped in a sterile fashion.  Local anesthesia: Topical Ethyl chloride.  With sterile technique and under real time ultrasound guidance:  Greater trochanteric area was visualized and patient's bursa was noted. A 22-gauge 3 inch needle was inserted and 4 cc of 0.5% Marcaine and 1 cc of Kenalog 40 mg/dL was injected. Pictures taken Completed  without difficulty  Pain immediately resolved suggesting accurate placement of the medication.  Advised to call if fevers/chills, erythema, induration, drainage, or persistent bleeding.  Images permanently stored and available for review in the ultrasound unit.  Impression: Technically successful ultrasound guided injection.   Procedure: Real-time Ultrasound Guided Injection of left  greater trochanteric bursitis secondary to patient's body habitus Device: GE Logiq Q7  Ultrasound guided injection is preferred based studies that show increased duration, increased effect, greater accuracy, decreased procedural pain, increased response rate, and decreased cost with ultrasound guided versus blind injection.  Verbal informed consent obtained.  Time-out conducted.  Noted no overlying erythema, induration, or other signs of local infection.  Skin prepped in a sterile fashion.  Local anesthesia: Topical Ethyl chloride.  With sterile technique and under real time ultrasound guidance:  Greater trochanteric area was visualized and patient's bursa was noted. A 22-gauge 3 inch needle was inserted and 4 cc of 0.5%  Marcaine and 1 cc of Kenalog 40 mg/dL was injected. Pictures taken Completed without difficulty  Pain immediately resolved suggesting accurate placement of the medication.  Advised to call if fevers/chills, erythema, induration, drainage, or persistent bleeding.  Images permanently stored and available for review in the ultrasound unit.  Impression: Technically successful ultrasound guided injection.    Impression and Recommendations:     This case required medical decision making of moderate complexity. The above documentation has been reviewed and is accurate and complete Lyndal Pulley, DO       Note: This dictation was prepared with Dragon dictation along with smaller phrase technology. Any transcriptional errors that result from this process are unintentional.

## 2018-08-27 NOTE — Assessment & Plan Note (Signed)
Decision today to treat with OMT was based on Physical Exam  After verbal consent patient was treated with HVLA, ME, FPR techniques in cervical, thoracic, rib lumbar and sacral areas  Patient tolerated the procedure well with improvement in symptoms  Patient given exercises, stretches and lifestyle modifications  See medications in patient instructions if given  Patient will follow up in 6-8 weeks

## 2018-08-27 NOTE — Assessment & Plan Note (Signed)
Bilateral injections given again today.  Patient did tolerate the procedure well.  We discussed the differential includes a potential lumbar radiculopathy.  Patient is to increase activity slowly over the course of next several days.  Discussed which activities to avoid.  Topical anti-inflammatories which patient does not believe has helped in the past.  Follow-up again in 6 to 8 weeks

## 2018-09-16 ENCOUNTER — Ambulatory Visit (INDEPENDENT_AMBULATORY_CARE_PROVIDER_SITE_OTHER): Payer: BC Managed Care – PPO | Admitting: Psychiatry

## 2018-09-16 ENCOUNTER — Other Ambulatory Visit: Payer: Self-pay

## 2018-09-16 DIAGNOSIS — F4312 Post-traumatic stress disorder, chronic: Secondary | ICD-10-CM | POA: Diagnosis not present
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
## 2018-09-22 ENCOUNTER — Encounter: Payer: Self-pay | Admitting: Psychiatry

## 2018-09-25 ENCOUNTER — Encounter: Payer: Self-pay | Admitting: Psychiatry

## 2018-09-25 ENCOUNTER — Ambulatory Visit (INDEPENDENT_AMBULATORY_CARE_PROVIDER_SITE_OTHER): Payer: BC Managed Care – PPO | Admitting: Psychiatry

## 2018-09-25 ENCOUNTER — Other Ambulatory Visit: Payer: Self-pay

## 2018-09-25 DIAGNOSIS — F4312 Post-traumatic stress disorder, chronic: Secondary | ICD-10-CM | POA: Diagnosis not present
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
## 2018-10-07 ENCOUNTER — Other Ambulatory Visit: Payer: Self-pay

## 2018-10-07 ENCOUNTER — Ambulatory Visit (INDEPENDENT_AMBULATORY_CARE_PROVIDER_SITE_OTHER): Payer: BC Managed Care – PPO | Admitting: Psychiatry

## 2018-10-07 ENCOUNTER — Encounter: Payer: Self-pay | Admitting: Psychiatry

## 2018-10-07 DIAGNOSIS — F4312 Post-traumatic stress disorder, chronic: Secondary | ICD-10-CM
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
## 2018-10-07 NOTE — Progress Notes (Deleted)
[name] [address], [place] [address] Phone: [phone] Subjective:    I'm seeing this patient by the request  of:    CC:   [id number]  [name] is a 43 y.o. female coming in with complaint of ***  Onset-  Location Duration-  Character- Aggravating factors- Reliving factors-  Therapies tried-  Severity-     Past Medical History:  Diagnosis Date  . Anxiety   . Back pain   . Chronic hip pain   . Depression   . GERD (gastroesophageal reflux disease)    Past Surgical History:  Procedure Laterality Date  . FOOT SURGERY    . MOUTH SURGERY     Social History   Socioeconomic History  . Marital status: Married    Spouse name: Not on file  . Number of children: 3  . Years of education: 2 years of college  . Highest education level: Not on file  Occupational History  . Occupation: Agricultural engineer  Social Needs  . Financial resource strain: Not on file  . Food insecurity    Worry: Not on file    Inability: Not on file  . Transportation needs    Medical: Not on file    Non-medical: Not on file  Tobacco Use  . Smoking status: Never Smoker  . Smokeless tobacco: Never Used  Substance and Sexual Activity  . Alcohol use: Yes    Comment: Occasionally  . Drug use: No  . Sexual activity: Not on file  Lifestyle  . Physical activity    Days per week: Not on file    Minutes per session: Not on file  . Stress: Not on file  Relationships  . Social Herbalist on phone: Not on file    Gets together: Not on file    Attends religious service: Not on file    Active member of club or organization: Not on file    Attends meetings of clubs or organizations: Not on file    Relationship status: Not on file  Other Topics Concern  . Not on file  Social History Narrative   Lives at home with her husband and children.   Right-handed.   1 cup caffeine day.   Allergies  Allergen Reactions  . Meloxicam Nausea And  Vomiting   Family History  Problem Relation Age of Onset  . Hyperlipidemia Mother   . Depression Mother   . Anxiety disorder Mother   . Neuropathy Mother   . Fibromyalgia Mother   . Heart disease Father   . Depression Father   . Depression Maternal Uncle   . Bipolar disorder Maternal Grandmother      Current Outpatient Medications (Cardiovascular):  .  doxazosin (CARDURA) 4 MG tablet, Take 1 tablet (4 mg total) by mouth daily.     Current Outpatient Medications (Other):  Marland Kitchen  buPROPion (WELLBUTRIN XL) 300 MG 24 hr tablet, Take 1 tablet (300 mg total) by mouth daily for 30 days. .  Cholecalciferol (VITAMIN D PO), Take by mouth daily. .  Doxepin HCl (SILENOR) 3 MG TABS, 1 tablet (3 mg total) by Per post-pyloric tube route at bedtime. .  Misc Natural Products (APPLE CIDER VINEGAR DIET) TABS, Take by mouth. .  Multiple Vitamins-Minerals (MULTIVITAMIN PO), Take by mouth daily. .  Omega-3 Fatty Acids (FISH OIL PO), Take by mouth. .  sertraline (ZOLOFT) 100 MG tablet, Take 1.5 tablets (150 mg total) by mouth daily for 30  days. .  traZODone (DESYREL) 50 MG tablet, Take 1 tablet (50 mg total) by mouth at bedtime as needed for up to 30 days. .  Vitamin D, Ergocalciferol, (DRISDOL) 50000 units CAPS capsule, Take 1 capsule (50,000 Units total) by mouth every 7 (seven) days.    Past medical history, social, surgical and family history all reviewed in electronic medical record.  No pertanent information unless stated regarding to the chief complaint.   Review of Systems:  No headache, visual changes, nausea, vomiting, diarrhea, constipation, dizziness, abdominal pain, skin rash, fevers, chills, night sweats, weight loss, swollen lymph nodes, body aches, joint swelling, muscle aches, chest pain, shortness of breath, mood changes.   Objective  There were no vitals taken for this visit. Systems examined below as of    General: No apparent distress alert and oriented x3 mood and affect  normal, dressed appropriately.  HEENT: Pupils equal, extraocular movements intact  Respiratory: Patient's speak in full sentences and does not appear short of breath  Cardiovascular: No lower extremity edema, non tender, no erythema  Skin: Warm dry intact with no signs of infection or rash on extremities or on axial skeleton.  Abdomen: Soft nontender  Neuro: Cranial nerves II through XII are intact, neurovascularly intact in all extremities with 2+ DTRs and 2+ pulses.  Lymph: No lymphadenopathy of posterior or anterior cervical chain or axillae bilaterally.  Gait normal with good balance and coordination.  MSK:  Non tender with full range of motion and good stability and symmetric strength and tone of shoulders, elbows, wrist, hip, knee and ankles bilaterally.     Impression and Recommendations:     This case required medical decision making of moderate complexity. The above documentation has been reviewed and is accurate and complete Lyndal Pulley, DO       Note: This dictation was prepared with Dragon dictation along with smaller phrase technology. Any transcriptional errors that result from this process are unintentional.

## 2018-10-08 ENCOUNTER — Ambulatory Visit: Payer: BC Managed Care – PPO | Admitting: Family Medicine

## 2018-10-21 ENCOUNTER — Encounter: Payer: Self-pay | Admitting: Psychiatry

## 2018-10-21 ENCOUNTER — Other Ambulatory Visit: Payer: Self-pay

## 2018-10-21 ENCOUNTER — Ambulatory Visit (INDEPENDENT_AMBULATORY_CARE_PROVIDER_SITE_OTHER): Payer: BC Managed Care – PPO | Admitting: Psychiatry

## 2018-10-21 DIAGNOSIS — F4312 Post-traumatic stress disorder, chronic: Secondary | ICD-10-CM
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
## 2018-11-04 ENCOUNTER — Ambulatory Visit (INDEPENDENT_AMBULATORY_CARE_PROVIDER_SITE_OTHER): Payer: BC Managed Care – PPO | Admitting: Psychiatry

## 2018-11-04 ENCOUNTER — Other Ambulatory Visit: Payer: Self-pay

## 2018-11-04 ENCOUNTER — Telehealth: Payer: Self-pay | Admitting: Psychiatry

## 2018-11-04 DIAGNOSIS — F4312 Post-traumatic stress disorder, chronic: Secondary | ICD-10-CM

## 2018-11-04 DIAGNOSIS — F5105 Insomnia due to other mental disorder: Secondary | ICD-10-CM

## 2018-11-04 DIAGNOSIS — F99 Mental disorder, not otherwise specified: Secondary | ICD-10-CM

## 2018-11-04 MED ORDER — TRAZODONE HCL 50 MG PO TABS: 50.0000 mg | ORAL_TABLET | Freq: Every evening | ORAL | 5 refills | Status: DC | PRN

## 2018-11-04 NOTE — Telephone Encounter (Signed)
Pt reported to therapist that she is almost out of Trazodone and does not think she has refills remaining. Refills sent
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
## 2018-11-06 ENCOUNTER — Encounter: Payer: Self-pay | Admitting: Psychiatry

## 2018-11-18 ENCOUNTER — Ambulatory Visit (INDEPENDENT_AMBULATORY_CARE_PROVIDER_SITE_OTHER): Payer: BC Managed Care – PPO | Admitting: Psychiatry

## 2018-11-18 ENCOUNTER — Other Ambulatory Visit: Payer: Self-pay

## 2018-11-18 DIAGNOSIS — F4312 Post-traumatic stress disorder, chronic: Secondary | ICD-10-CM | POA: Diagnosis not present
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
## 2018-12-04 ENCOUNTER — Ambulatory Visit (INDEPENDENT_AMBULATORY_CARE_PROVIDER_SITE_OTHER): Payer: BC Managed Care – PPO | Admitting: Psychiatry

## 2018-12-04 ENCOUNTER — Other Ambulatory Visit: Payer: Self-pay

## 2018-12-04 DIAGNOSIS — F4312 Post-traumatic stress disorder, chronic: Secondary | ICD-10-CM | POA: Diagnosis not present

## 2018-12-04 NOTE — Progress Notes (Signed)
      [facility] Counselor/Therapist Progress Note  Patient ID: [name], MRN: [id number],    Date: 12/04/2018  Time Spent: 50 minutes start time 3:08 PM end time 3:58 PM  Treatment Type: Individual Therapy  Reported Symptoms: triggered responses, anxiety  Mental Status Exam:  Appearance:   Well Groomed     Behavior:  Appropriate  Motor:  Normal  Speech/Language:   Normal Rate  Affect:  Appropriate  Mood:  anxious  Thought process:  normal  Thought content:    WNL  Sensory/Perceptual disturbances:    WNL  Orientation:  oriented to person, place, time/date and situation  Attention:  Good  Concentration:  Good  Memory:  WNL  Fund of knowledge:   Good  Insight:    Good  Judgment:   Good  Impulse Control:  Good   Risk Assessment: Danger to Self:  No Self-injurious Behavior: No Danger to Others: No Duty to Warn:no Physical Aggression / Violence:No  Access to Firearms a concern: No  Gang Involvement:No   Subjective: Patient was present for session.  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  Patient was able to share that the last time her in-laws came it was very difficult and she got very triggered.  Had patient think through what the triggers are by their behavior.  From there developed CBT filters and coping skills to help patient deal with the visit appropriately.  Patient was also able to realize that she has to set up some situations where she feels her children are also buffered from their difficult behavior.  Discussed different strategies to help her do that.  The importance of reminding herself of the box theory and focusing on the things she can control fix and change was discussed with patient and she was able to develop some plans that she felt would be helpful.  Patient is to continue working on exercising and engaging her children and walking with her to  release anxiety appropriately  Interventions: Cognitive Behavioral Therapy and Solution-Oriented/Positive Psychology  Diagnosis:   ICD-10-CM   1. Chronic post-traumatic stress disorder (PTSD)  F43.12     Plan: Patient is to utilize coping skills and CBT skills to manage triggered responses.  Patient is to follow the plan developed in session to buffer the children from her in-laws and to get out and walk on a daily basis to give her a way to release her stress appropriately. Long-term goal: Recall the traumatic event without becoming overwhelmed with negative emotions Short-term goal: Practice implement relaxation training as a coping mechanism for tension anger stress anxiety Sleep without being disturbed by dreams of the trauma  Lina Sayre, [facility]

## 2018-12-18 ENCOUNTER — Ambulatory Visit: Payer: BC Managed Care – PPO | Admitting: Psychiatry

## 2019-01-01 ENCOUNTER — Ambulatory Visit (INDEPENDENT_AMBULATORY_CARE_PROVIDER_SITE_OTHER): Payer: BC Managed Care – PPO | Admitting: Psychiatry

## 2019-01-01 ENCOUNTER — Other Ambulatory Visit: Payer: Self-pay

## 2019-01-01 ENCOUNTER — Encounter: Payer: Self-pay | Admitting: Psychiatry

## 2019-01-01 ENCOUNTER — Ambulatory Visit: Payer: BC Managed Care – PPO | Admitting: Family Medicine

## 2019-01-01 DIAGNOSIS — F4312 Post-traumatic stress disorder, chronic: Secondary | ICD-10-CM | POA: Diagnosis not present
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
## 2019-01-07 ENCOUNTER — Encounter: Payer: Self-pay | Admitting: Family Medicine

## 2019-01-07 ENCOUNTER — Ambulatory Visit (INDEPENDENT_AMBULATORY_CARE_PROVIDER_SITE_OTHER): Payer: BC Managed Care – PPO | Admitting: Family Medicine

## 2019-01-07 ENCOUNTER — Other Ambulatory Visit: Payer: Self-pay

## 2019-01-07 VITALS — BP 110/68 | HR 90 | Ht 62.0 in | Wt 159.0 lb

## 2019-01-07 DIAGNOSIS — M5442 Lumbago with sciatica, left side: Secondary | ICD-10-CM

## 2019-01-07 DIAGNOSIS — M999 Biomechanical lesion, unspecified: Secondary | ICD-10-CM

## 2019-01-07 DIAGNOSIS — M5441 Lumbago with sciatica, right side: Secondary | ICD-10-CM | POA: Diagnosis not present

## 2019-01-07 DIAGNOSIS — G8929 Other chronic pain: Secondary | ICD-10-CM | POA: Diagnosis not present

## 2019-01-07 NOTE — Progress Notes (Signed)
[name] [address], [place] [address] Phone: [phone] Subjective:   [name], am serving as a scribe for Dr. Hulan Saas.  This visit occurred during the SARS-CoV-2 public health emergency.  Safety protocols were in place, including screening questions prior to the visit, additional usage of staff PPE, and extensive cleaning of exam room while observing appropriate contact time as indicated for disinfecting solutions.    CC: Low back pain follow-up  HGD:[id number]   08/27/2018 Bilateral injections given again today.  Patient did tolerate the procedure well.  We discussed the differential includes a potential lumbar radiculopathy.  Patient is to increase activity slowly over the course of next several days.  Discussed which activities to avoid.  Topical anti-inflammatories which patient does not believe has helped in the past.  Follow-up again in 6 to 8 weeks  Update 01/07/2019 [name] is a 43 y.o. female coming in with complaint of bilateral greater trochanter pain. Patient states that she is having back pain. Is here for OMT.  Overall more of the back pain. [name] radiation pain with a dull, throbbing aching pain.  Patient also notes that she woke up 2 weeks ago with elbow pain. Pain has dissipated but she was unable to fully extend her elbow.     Past Medical History:  Diagnosis Date  . Anxiety   . Back pain   . Chronic hip pain   . Depression   . GERD (gastroesophageal reflux disease)    Past Surgical History:  Procedure Laterality Date  . FOOT SURGERY    . MOUTH SURGERY     Social History   Socioeconomic History  . Marital status: Married    Spouse name: Not on file  . Number of children: 3  . Years of education: 2 years of college  . Highest education level: Not on file  Occupational History  . Occupation: Agricultural engineer  Social Needs  . Financial resource strain: Not on file  . Food insecurity    Worry: Not on  file    Inability: Not on file  . Transportation needs    Medical: Not on file    Non-medical: Not on file  Tobacco Use  . Smoking status: Never Smoker  . Smokeless tobacco: Never Used  Substance and Sexual Activity  . Alcohol use: Yes    Comment: Occasionally  . Drug use: [name]  . Sexual activity: Not on file  Lifestyle  . Physical activity    Days per week: Not on file    Minutes per session: Not on file  . Stress: Not on file  Relationships  . Social Herbalist on phone: Not on file    Gets together: Not on file    Attends religious service: Not on file    Active member of club or organization: Not on file    Attends meetings of clubs or organizations: Not on file    Relationship status: Not on file  Other Topics Concern  . Not on file  Social History Narrative   Lives at home with her husband and children.   Right-handed.   1 cup caffeine day.   Allergies  Allergen Reactions  . Meloxicam Nausea And Vomiting   Family History  Problem Relation Age of Onset  . Hyperlipidemia Mother   . Depression Mother   . Anxiety disorder Mother   . Neuropathy Mother   . Fibromyalgia Mother   . Heart disease  Father   . Depression Father   . Depression Maternal Uncle   . Bipolar disorder Maternal Grandmother      Current Outpatient Medications (Cardiovascular):  .  doxazosin (CARDURA) 4 MG tablet, Take 1 tablet (4 mg total) by mouth daily.     Current Outpatient Medications (Other):  Marland Kitchen  Cholecalciferol (VITAMIN D PO), Take by mouth daily. .  Misc Natural Products (APPLE CIDER VINEGAR DIET) TABS, Take by mouth. .  Multiple Vitamins-Minerals (MULTIVITAMIN PO), Take by mouth daily. .  Omega-3 Fatty Acids (FISH OIL PO), Take by mouth. .  Vitamin D, Ergocalciferol, (DRISDOL) 50000 units CAPS capsule, Take 1 capsule (50,000 Units total) by mouth every 7 (seven) days. Marland Kitchen  buPROPion (WELLBUTRIN XL) 300 MG 24 hr tablet, Take 1 tablet (300 mg total) by mouth daily for 30  days. .  Doxepin HCl (SILENOR) 3 MG TABS, 1 tablet (3 mg total) by Per post-pyloric tube route at bedtime. .  sertraline (ZOLOFT) 100 MG tablet, Take 1.5 tablets (150 mg total) by mouth daily for 30 days. .  traZODone (DESYREL) 50 MG tablet, Take 1 tablet (50 mg total) by mouth at bedtime as needed.    Past medical history, social, surgical and family history all reviewed in electronic medical record.  [name] pertanent information unless stated regarding to the chief complaint.   Review of Systems:  [name] headache, visual changes, nausea, vomiting, diarrhea, constipation, dizziness, abdominal pain, skin rash, fevers, chills, night sweats, weight loss, swollen lymph nodes, body aches, joint swelling, chest pain, shortness of breath, mood changes.  Positive muscle aches  Objective  Blood pressure 110/68, pulse 90, height 5' 2"  (1.575 m), weight 159 lb (72.1 kg), SpO2 97 %. f    General: [name] apparent distress alert and oriented x3 mood and affect normal, dressed appropriately.  HEENT: Pupils equal, extraocular movements intact  Respiratory: Patient's speak in full sentences and does not appear short of breath  Cardiovascular: [name] lower extremity edema, non tender, [name] erythema  Skin: Warm dry intact with [name] signs of infection or rash on extremities or on axial skeleton.  Abdomen: Soft nontender  Neuro: Cranial nerves II through XII are intact, neurovascularly intact in all extremities with 2+ DTRs and 2+ pulses.  Lymph: [name] lymphadenopathy of posterior or anterior cervical chain or axillae bilaterally.  Gait normal with good balance and coordination.  MSK:  Non tender with full range of motion and good stability and symmetric strength and tone of shoulders, elbows, wrist, hip, knee and ankles bilaterally.  Back exam shows loss of lordosis.  Nontender to palpation in the paraspinal flexion of the lumbar spine right greater than left.  Positive Corky Sox.  Osteopathic findings C4 flexed rotated and side  bent left C7 flexed rotated and side bent left T3 extended rotated and side bent right inhaled third rib T9 extended rotated and side bent left L2 flexed rotated and side bent right Sacrum right on right    Impression and Recommendations:     This case required medical decision making of moderate complexity. The above documentation has been reviewed and is accurate and complete Lyndal Pulley, DO       Note: This dictation was prepared with Dragon dictation along with smaller phrase technology. Any transcriptional errors that result from this process are unintentional.

## 2019-01-07 NOTE — Patient Instructions (Addendum)
  [address], [address]st [address] [place], [place] [address] Phone [phone]  See me again in 2 months Don't lift anything over hand Happy Holidays

## 2019-01-07 NOTE — Assessment & Plan Note (Signed)
Low back pain is multifactorial.  Still having some difficulty.  Has responded well to osteopathic manipulation previously.  Discussed posture and ergonomics, discussed importance of core strengthening, discussed ergonomics and can make changes at the home office.  Patient will follow up with me again in 4 to 8 weeks

## 2019-01-07 NOTE — Assessment & Plan Note (Addendum)
Decision today to treat with OMT was based on Physical Exam  After verbal consent patient was treated with HVLA, ME, FPR techniques in cervical, thoracic, rib lumbar and sacral areas  Patient tolerated the procedure well with improvement in symptoms  Patient given exercises, stretches and lifestyle modifications  See medications in patient instructions if given  Patient will follow up in 4-8 weeks

## 2019-01-10 ENCOUNTER — Other Ambulatory Visit: Payer: Self-pay | Admitting: Psychiatry

## 2019-01-10 DIAGNOSIS — F4310 Post-traumatic stress disorder, unspecified: Secondary | ICD-10-CM

## 2019-01-15 ENCOUNTER — Ambulatory Visit (INDEPENDENT_AMBULATORY_CARE_PROVIDER_SITE_OTHER): Payer: BC Managed Care – PPO | Admitting: Psychiatry

## 2019-01-15 ENCOUNTER — Encounter: Payer: Self-pay | Admitting: Psychiatry

## 2019-01-15 ENCOUNTER — Other Ambulatory Visit: Payer: Self-pay

## 2019-01-15 ENCOUNTER — Ambulatory Visit: Payer: BC Managed Care – PPO | Admitting: Psychiatry

## 2019-01-15 DIAGNOSIS — F99 Mental disorder, not otherwise specified: Secondary | ICD-10-CM

## 2019-01-15 DIAGNOSIS — F5105 Insomnia due to other mental disorder: Secondary | ICD-10-CM | POA: Diagnosis not present

## 2019-01-15 DIAGNOSIS — F4310 Post-traumatic stress disorder, unspecified: Secondary | ICD-10-CM

## 2019-01-15 DIAGNOSIS — F4312 Post-traumatic stress disorder, chronic: Secondary | ICD-10-CM

## 2019-01-15 DIAGNOSIS — F330 Major depressive disorder, recurrent, mild: Secondary | ICD-10-CM

## 2019-01-15 MED ORDER — DOXEPIN HCL 10 MG PO CAPS: 10.0000 mg | ORAL_CAPSULE | Freq: Every evening | ORAL | 5 refills | Status: DC | PRN

## 2019-01-15 MED ORDER — DOXAZOSIN MESYLATE 4 MG PO TABS: ORAL_TABLET | ORAL | 5 refills | Status: DC

## 2019-01-15 MED ORDER — TRAZODONE HCL 50 MG PO TABS: 50.0000 mg | ORAL_TABLET | Freq: Every evening | ORAL | 5 refills | Status: DC | PRN

## 2019-01-15 MED ORDER — SERTRALINE HCL 100 MG PO TABS: 150.0000 mg | ORAL_TABLET | Freq: Every day | ORAL | 5 refills | Status: DC

## 2019-01-15 MED ORDER — BUPROPION HCL ER (XL) 300 MG PO TB24: 300.0000 mg | ORAL_TABLET | Freq: Every day | ORAL | 5 refills | Status: DC

## 2019-01-15 NOTE — Progress Notes (Signed)
[facility] Counselor/Therapist Progress Note  Patient ID: [name], MRN: [id number],    Date: 01/15/2019  Time Spent: 50 minutes start time 11:11 AM end time 12:01 PM  Treatment Type: Individual Therapy  Reported Symptoms: anxiety, frustration, triggered responses  Mental Status Exam:  Appearance:   Well Groomed     Behavior:  Appropriate  Motor:  Normal  Speech/Language:   Normal Rate  Affect:  Appropriate  Mood:  anxious  Thought process:  normal  Thought content:    Rumination  Sensory/Perceptual disturbances:    WNL  Orientation:  oriented to person, place, time/date and situation  Attention:  Good  Concentration:  Good  Memory:  WNL  Fund of knowledge:   Good  Insight:    Good  Judgment:   Good  Impulse Control:  Good   Risk Assessment: Danger to Self:  No Self-injurious Behavior: No Danger to Others: No Duty to Warn:no Physical Aggression / Violence:No  Access to Firearms a concern: No  Gang Involvement:No   Subjective: Patient was present for session.  She shared that they will be going to a cabin with her husband's family.  She shared that she is not wanting to go because it is a stressful event but she knows that she has to go. Patient explained that Christmas was fun growing up but it is not with her husband's family.  She shared that his mother is extremely negative and is very ugly to her children so they do not want to be there.  She gave lots of examples of when things did not go the way that the mother-in-law wanted them to and the kids ended up feeling very negative like she did as a child.  Allowed patient time to discuss the different situations with her husband and his family.  She was able to share that she feels he cannot say no to his family but would say no to hers and that is acceptable in his eyes.  She explained that makes it very difficult for her to want to be with his family knowing that things are going to be miserable.  Had patient  think through anything she can do to make the situation okay for she and her children.  Discussed different CBT skills to help her have an emotional disconnect from the things that her mother-in-law says.  Also encouraged her to have some inside jokes with her children and some buzz words that they can say when they are getting triggered or frustrated so that someone can help intervene in that situation.  Patient was encouraged to manage her emotions and to recognize that her husband is probably triggered as well and his difficulty saying no to his mother is his issue just like she has her triggered stuff with her family.  The importance of her trying not to take it personally discussed with patient.  Patient also shared that her dad has prostrate cancer.    She reported she is not sure how to feel about the situation.  Encouraged her to wait until more information surfaces and at that point she will have a better idea on how she can deal with her emotions appropriately.  Interventions: Cognitive Behavioral Therapy and Solution-Oriented/Positive Psychology  Diagnosis:   ICD-10-CM   1. Chronic post-traumatic stress disorder (PTSD)  F43.12     Plan: Patient is to utilize CBT and coping skills to manage triggered responses.  Patient is to communicate with her children and develop a  plan so that they can let each other know when they are feeling triggered by the negativity of patient's mother-in-law.  Patient is also to work on ways to release her emotions in an appropriate manner and to take time for herself to relax reflect and breathe. Long-term goal: Recall the traumatic event without becoming overwhelmed with negative emotions Short-term goal: Practice implement relaxation training as a coping mechanism for tension panic stress anger and anxiety Sleep without being disturbed by dreams of trauma  [name], [facility]

## 2019-01-15 NOTE — Progress Notes (Signed)
[name] [id number] December 24, 1975 43 y.o.  Virtual Visit via Telephone Note  I connected with pt on 01/15/19 at 12:45 PM EST by telephone and verified that I am speaking with the correct person using two identifiers.   I discussed the limitations, risks, security and privacy concerns of performing an evaluation and management service by telephone and the availability of in person appointments. I also discussed with the patient that there may be a patient responsible charge related to this service. The patient expressed understanding and agreed to proceed.   I discussed the assessment and treatment plan with the patient. The patient was provided an opportunity to ask questions and all were answered. The patient agreed with the plan and demonstrated an understanding of the instructions.   The patient was advised to call back or seek an in-person evaluation if the symptoms worsen or if the condition fails to improve as anticipated.  I provided 25 minutes of non-face-to-face time during this encounter.  The patient was located at home.  The provider was located at [place].   Thayer Headings, PMHNP   Subjective:   Patient ID:  [name] is a 43 y.o. (DOB December 18, 1975) female.  Chief Complaint:  Chief Complaint  Patient presents with  . Follow-up    Anxiety, Depression, and insomnia    HPI [name] presents for follow-up of anxiety, depression, and insomnia. She reports that she has been doing well overall- "the medicines are still working well." She reports that she feels "dopey" when she takes Trazodone and Doxazosin. She reports that she has not been having as many nightmares compared to the past. Asks about taking 1/2 a tab of Doxazosin. Sleeping 7-10 hours a night overall with occasional poor nights of sleep.   She reports that her mood has been good. Occ mild depression that is brief in duration. She reports that her anxiety has been manageable. Motivation  has been good. She reports energy is low in the morning and gradually improves throughout the day. Concentration has been ok overall with some trouble focusing. Reports that concentration improves throughout the day. Denies SI.   Past Psychiatric Medication Trials: Effexor XR Wellbutrin- Causes GI upset in the morning. Prefers to take  Trazodone- Reports that she has taken it intermittently. Reports that 25 mg is ineffective. Sertraline Doxazosin- helpful for nightmares Silenor- Reports that 3 mg was effective but cost prohibitive.  Review of Systems:  Review of Systems  Musculoskeletal: Positive for back pain. Negative for gait problem.       Hip pain  Neurological: Negative for tremors.  Psychiatric/Behavioral:       Please refer to HPI    Recently had labs that were WNL to include Vit D level.   Medications: I have reviewed the patient's current medications.  Current Outpatient Medications  Medication Sig Dispense Refill  . Cholecalciferol (VITAMIN D PO) Take by mouth daily.    Marland Kitchen doxazosin (CARDURA) 4 MG tablet Take 1/2-1 tab po QHS 30 tablet 5  . MAGNESIUM PO Take by mouth.    . Misc Natural Products (APPLE CIDER VINEGAR DIET) TABS Take by mouth.    . Multiple Vitamins-Minerals (MULTIVITAMIN PO) Take by mouth daily.    Marland Kitchen buPROPion (WELLBUTRIN XL) 300 MG 24 hr tablet Take 1 tablet (300 mg total) by mouth daily. 30 tablet 5  . cholecalciferol (VITAMIN D3) 25 MCG (1000 UT) tablet Take 1,000 Units by mouth daily.    Marland Kitchen doxepin (SINEQUAN) 10 MG capsule Take 1 capsule (  10 mg total) by mouth at bedtime as needed. 30 capsule 5  . Omega-3 Fatty Acids (FISH OIL PO) Take by mouth.    . sertraline (ZOLOFT) 100 MG tablet Take 1.5 tablets (150 mg total) by mouth daily. 45 tablet 5  . traZODone (DESYREL) 50 MG tablet Take 1 tablet (50 mg total) by mouth at bedtime as needed. 30 tablet 5  . Vitamin D, Ergocalciferol, (DRISDOL) 50000 units CAPS capsule Take 1 capsule (50,000 Units total) by  mouth every 7 (seven) days. (Patient not taking: Reported on 01/15/2019) 12 capsule 0   No current facility-administered medications for this visit.    Medication Side Effects: Sedation  Allergies:  Allergies  Allergen Reactions  . Meloxicam Nausea And Vomiting    Past Medical History:  Diagnosis Date  . Anxiety   . Back pain   . Chronic hip pain   . Depression   . GERD (gastroesophageal reflux disease)     Family History  Problem Relation Age of Onset  . Hyperlipidemia Mother   . Depression Mother   . Anxiety disorder Mother   . Neuropathy Mother   . Fibromyalgia Mother   . Heart disease Father   . Depression Father   . Depression Maternal Uncle   . Bipolar disorder Maternal Grandmother     Social History   Socioeconomic History  . Marital status: Married    Spouse name: Not on file  . Number of children: 3  . Years of education: 2 years of college  . Highest education level: Not on file  Occupational History  . Occupation: Homemaker  Tobacco Use  . Smoking status: Never Smoker  . Smokeless tobacco: Never Used  Substance and Sexual Activity  . Alcohol use: Yes    Comment: Occasionally  . Drug use: No  . Sexual activity: Not on file  Other Topics Concern  . Not on file  Social History Narrative   Lives at home with her husband and children.   Right-handed.   1 cup caffeine day.   Social Determinants of Health   Financial Resource Strain:   . Difficulty of Paying Living Expenses: Not on file  Food Insecurity:   . Worried About Charity fundraiser in the Last Year: Not on file  . Ran Out of Food in the Last Year: Not on file  Transportation Needs:   . Lack of Transportation (Medical): Not on file  . Lack of Transportation (Non-Medical): Not on file  Physical Activity:   . Days of Exercise per Week: Not on file  . Minutes of Exercise per Session: Not on file  Stress:   . Feeling of Stress : Not on file  Social Connections:   . Frequency of  Communication with Friends and Family: Not on file  . Frequency of Social Gatherings with Friends and Family: Not on file  . Attends Religious Services: Not on file  . Active Member of Clubs or Organizations: Not on file  . Attends Archivist Meetings: Not on file  . Marital Status: Not on file  Intimate Partner Violence:   . Fear of Current or Ex-Partner: Not on file  . Emotionally Abused: Not on file  . Physically Abused: Not on file  . Sexually Abused: Not on file    Past Medical History, Surgical history, Social history, and Family history were reviewed and updated as appropriate.   Please see review of systems for further details on the patient's review from today.  Objective:   Physical Exam:  Wt 158 lb 15.9 oz (72.1 kg)   BMI 29.08 kg/m   Physical Exam Neurological:     Mental Status: She is alert and oriented to person, place, and time.     Cranial Nerves: No dysarthria.  Psychiatric:        Attention and Perception: Attention and perception normal.        Mood and Affect: Mood normal.        Speech: Speech normal.        Behavior: Behavior is cooperative.        Thought Content: Thought content normal. Thought content is not paranoid or delusional. Thought content does not include homicidal or suicidal ideation. Thought content does not include homicidal or suicidal plan.        Cognition and Memory: Cognition and memory normal.        Judgment: Judgment normal.     Comments: Insight intact     Lab Review:  No results found for: NA, K, CL, CO2, GLUCOSE, BUN, CREATININE, CALCIUM, PROT, ALBUMIN, AST, ALT, ALKPHOS, BILITOT, GFRNONAA, GFRAA     Component Value Date/Time   WBC 15.8 (H) 04/08/2007 0338   RBC 4.21 04/08/2007 0338   HGB 13.4 04/08/2007 0338   HCT 39.5 04/08/2007 0338   PLT 291 04/08/2007 0338   MCV 93.9 04/08/2007 0338   MCHC 33.8 04/08/2007 0338   RDW 16.5 (H) 04/08/2007 0338    No results found for: POCLITH, LITHIUM   No results  found for: PHENYTOIN, PHENOBARB, VALPROATE, CBMZ   .res Assessment: Plan:   Pt seen for 30 minutes and greater than 50% of session spent counseling pt re: strategies to improve sleep and mitigate side effects. Discussed that she can try to take 1/2 of Doxazosin since she questions if this is adding to daytime somnolence and fatigue. Recommend changing administration time of Wellbutrin XL to morning to possibly improve energy and motivation during the day.  Will send script for Doxepin 10 mg po QHS since pt reports that Silenor was effective for insomnia and had less side effects than Trazodone but was cost prohibitive. Discussed that [place] may be available through mail order for a lower cost if unable to tolerate Doxepin 10 mg po QHS. Recommend continuing psychotherapy with Lina Sayre, [place]. Pt to f/u in 6 months or sooner if clinically indicated.  Patient advised to contact office with any questions, adverse effects, or acute worsening in signs and symptoms.  Keaja was seen today for follow-up.  Diagnoses and all orders for this visit:  Insomnia due to other mental disorder -     doxepin (SINEQUAN) 10 MG capsule; Take 1 capsule (10 mg total) by mouth at bedtime as needed. -     traZODone (DESYREL) 50 MG tablet; Take 1 tablet (50 mg total) by mouth at bedtime as needed.  PTSD (post-traumatic stress disorder) -     sertraline (ZOLOFT) 100 MG tablet; Take 1.5 tablets (150 mg total) by mouth daily. -     doxazosin (CARDURA) 4 MG tablet; Take 1/2-1 tab po QHS  Mild episode of recurrent major depressive disorder (HCC) -     buPROPion (WELLBUTRIN XL) 300 MG 24 hr tablet; Take 1 tablet (300 mg total) by mouth daily.    Please see After Visit Summary for patient specific instructions.  Future Appointments  Date Time Provider [place]  02/12/2019 10:00 AM Lina Sayre, [facility] CP-[facility] None  02/24/2019 11:00 AM Lina Sayre, [facility] CP-CP  None  03/11/2019 10:00 AM Lyndal Pulley, DO  [facility]    No orders of the defined types were placed in this encounter.     -------------------------------

## 2019-02-07 ENCOUNTER — Other Ambulatory Visit: Payer: Self-pay | Admitting: Psychiatry

## 2019-02-07 DIAGNOSIS — F4310 Post-traumatic stress disorder, unspecified: Secondary | ICD-10-CM

## 2019-02-08 NOTE — Telephone Encounter (Signed)
Submitted 12/17 with 5 refills

## 2019-02-12 ENCOUNTER — Encounter: Payer: Self-pay | Admitting: Psychiatry

## 2019-02-12 ENCOUNTER — Other Ambulatory Visit: Payer: Self-pay

## 2019-02-12 ENCOUNTER — Ambulatory Visit (INDEPENDENT_AMBULATORY_CARE_PROVIDER_SITE_OTHER): Payer: BC Managed Care – PPO | Admitting: Psychiatry

## 2019-02-12 DIAGNOSIS — F4312 Post-traumatic stress disorder, chronic: Secondary | ICD-10-CM

## 2019-02-12 NOTE — Progress Notes (Signed)
[facility] Counselor/Therapist Progress Note  Patient ID: [name], MRN: [id number],    Date: 02/12/2019  Time Spent: 50 minutes start time 10:07 AM end time 10:57 AM  Treatment Type: Individual Therapy  Reported Symptoms: Triggered responses, sadness, anxiety  Mental Status Exam:  Appearance:   Casual     Behavior:  Sharing  Motor:  Normal  Speech/Language:   Normal Rate  Affect:  Appropriate  Mood:  sad  Thought process:  normal  Thought content:    WNL  Sensory/Perceptual disturbances:    WNL  Orientation:  oriented to person, place, time/date and situation  Attention:  Good  Concentration:  Good  Memory:  WNL  Fund of knowledge:   Good  Insight:    Good  Judgment:   Good  Impulse Control:  Good   Risk Assessment: Danger to Self:  No Self-injurious Behavior: No Danger to Others: No Duty to Warn:no Physical Aggression / Violence:No  Access to Firearms a concern: No  Gang Involvement:No   Subjective: Patient was present for session. She reported she had made it through the trip but it was hard.  She went on to share she did get triggered by her husband's family.  She is also been triggered by him recently.  She explained that he has had a tendency to be critical of her since he has been working from home.  She shared it is very hard because he said some things recently that were similar to her father.  Patient stated she is not sure what to do about the situation.  Patient was encouraged to recognize that it sounds like the situation due to COVID has made it very difficult at her home and that both she and her husband are having lots of tension.  Patient stated that her house is very small and he is decided to work in the living room so he monitors everything that is, going on and then gets upset when he feels he is doing too much.  She was encouraged to try and get him to move to the bedroom to allow him privacy and also allow she and the children to function as  they typically do when he is at work.  Patient reported she did not feel that he would follow through with that plans that she had already asked him.  Discussed different things she can do to keep it herself at a good place despite the current situation and the importance of reminding herself that things are temporary and that she has to try and focus on the truth about her rather than getting into the battle with her husband over things that are situational.  Patient was also encouraged to remove herself whenever she can and to exercise to release her emotions in an appropriate manner.  Interventions: Cognitive Behavioral Therapy and Solution-Oriented/Positive Psychology  Diagnosis:   ICD-10-CM   1. Chronic post-traumatic stress disorder (PTSD)  F43.12     Plan: Patient is to utilize CBT and coping skills to help decrease triggered responses and emotions tied to the triggers.  Patient is to work on trying to remove herself when she gets triggered and work on exercising or taking time to ground herself. Long-term goal: Recall the traumatic event without becoming overwhelmed with negative emotions Short-term goal: Practice implement relaxation training as a coping mechanism for tension panic stress anger and anxiety Sleep without being disturbed by dreams of trauma  [name], [facility]

## 2019-02-24 ENCOUNTER — Ambulatory Visit (INDEPENDENT_AMBULATORY_CARE_PROVIDER_SITE_OTHER): Payer: BC Managed Care – PPO | Admitting: Psychiatry

## 2019-02-24 ENCOUNTER — Other Ambulatory Visit: Payer: Self-pay

## 2019-02-24 DIAGNOSIS — F4312 Post-traumatic stress disorder, chronic: Secondary | ICD-10-CM

## 2019-02-24 NOTE — Progress Notes (Signed)
      [facility] Counselor/Therapist Progress Note  Patient ID: [name], MRN: [id number],    Date: 02/24/2019  Time Spent: 50 minutes start time 11:09 AM end time 11:59 AM  Treatment Type: Individual Therapy  Reported Symptoms: anxiety, triggered responses  Mental Status Exam:  Appearance:   Well Groomed     Behavior:  Appropriate  Motor:  Normal  Speech/Language:   Normal Rate  Affect:  Appropriate  Mood:  anxious  Thought process:  normal  Thought content:    Rumination  Sensory/Perceptual disturbances:    WNL  Orientation:  oriented to person, place, time/date and situation  Attention:  Good  Concentration:  Good  Memory:  WNL  Fund of knowledge:   Good  Insight:    Good  Judgment:   Good  Impulse Control:  Good   Risk Assessment: Danger to Self:  No Self-injurious Behavior: No Danger to Others: No Duty to Warn:no Physical Aggression / Violence:No  Access to Firearms a concern: No  Gang Involvement:No   Subjective: Patient was present for session.  She reported she set up a situation at her home for her husband ,as discussed in last session and that has helped. Patient reported one of her daughter's friends has a similar situation to her growing up and that is triggering for her.  Patient was given information on CBT for couples.  Discussed how she and her husband can utilize it to try and help him understand what happens when she gets triggered and to also recognize that he gets triggered as well and needs to learn some ways to manage it.  Patient was also given handouts on cognitive distortions and the importance of recognizing when her thoughts are distorted and different strategies to help her get grounded again and recognize the truth.  Patient reported liking the handouts from session and feeling they would be helpful.  She wanted to do EMDR on her dad yelling about dishes in session.  Suds level 10, negative cognition "I am never doing anything right" felt  sadness and anxiety in her back.  Patient was able to reduce level of disturbance to 4.  Was encouraged to recognize the fact that she is enough and remind the younger parts of her of that regularly.  Patient was also encouraged to discuss this trigger with her husband as well to see if there is some ways they can manage things appropriately together.  Patient agreed to work on plans for session.  Interventions: Cognitive Behavioral Therapy, Solution-Oriented/Positive Psychology and Eye Movement Desensitization and Reprocessing (EMDR)  Diagnosis:   ICD-10-CM   1. Chronic post-traumatic stress disorder (PTSD)  F43.12     Plan: Patient is to utilize CBT and coping skills to manage triggered emotions appropriately.  Patient is to work on handouts from session with her husband and on her own to focus on recognizing and managing cognitive distortions.  Patient is to continue working on trying to exercise regularly to release her emotions in an appropriate manner. Long-term goal: Recall the traumatic event without becoming overwhelmed with negative emotions Short-term goal: Practice implement relaxation training as a coping mechanism for tension panic anxiety anger and stress. Sleep without being disturbed by dreams of trauma   Lina Sayre, [facility]

## 2019-03-02 ENCOUNTER — Other Ambulatory Visit: Payer: Self-pay | Admitting: Psychiatry

## 2019-03-02 DIAGNOSIS — F330 Major depressive disorder, recurrent, mild: Secondary | ICD-10-CM

## 2019-03-11 ENCOUNTER — Ambulatory Visit: Payer: BC Managed Care – PPO | Admitting: Family Medicine

## 2019-03-11 NOTE — Assessment & Plan Note (Deleted)
Chronic problem with exacerbation.  Patient has many different factors that are contributing to it.  Patient's underlying depression and anxiety are contributing daily.  We discussed medication management with patient in great length.  Discussed patient with social determinants of health and the patient's other chronic comorbidities could be contributing to some of her discomfort and pain.  Patient will follow up with me in 4 to 8 weeks

## 2019-03-11 NOTE — Assessment & Plan Note (Deleted)
Decision today to treat with OMT was based on Physical Exam  After verbal consent patient was treated with HVLA, ME, FPR techniques in cervical, thoracic, rib, lumbar and sacral areas  Patient tolerated the procedure well with improvement in symptoms  Patient given exercises, stretches and lifestyle modifications  See medications in patient instructions if given  Patient will follow up in 4-8 weeks

## 2019-03-11 NOTE — Progress Notes (Deleted)
[name] [place] Phone: [phone] Subjective:    I'm seeing this patient by the request  of:  Darden Amber, PA  CC: Neck and back pain follow-up  [id number]  [name] is a 44 y.o. female coming in with complaint of ***  Onset-  Location Duration-  Character- Aggravating factors- Reliving factors-  Therapies tried-  Severity-     Past Medical History:  Diagnosis Date  . Anxiety   . Back pain   . Chronic hip pain   . Depression   . GERD (gastroesophageal reflux disease)    Past Surgical History:  Procedure Laterality Date  . FOOT SURGERY    . MOUTH SURGERY     Social History   Socioeconomic History  . Marital status: Married    Spouse name: Not on file  . Number of children: 3  . Years of education: 2 years of college  . Highest education level: Not on file  Occupational History  . Occupation: Homemaker  Tobacco Use  . Smoking status: Never Smoker  . Smokeless tobacco: Never Used  Substance and Sexual Activity  . Alcohol use: Yes    Comment: Occasionally  . Drug use: No  . Sexual activity: Not on file  Other Topics Concern  . Not on file  Social History Narrative   Lives at home with her husband and children.   Right-handed.   1 cup caffeine day.   Social Determinants of Health   Financial Resource Strain:   . Difficulty of Paying Living Expenses: Not on file  Food Insecurity:   . Worried About Charity fundraiser in the Last Year: Not on file  . Ran Out of Food in the Last Year: Not on file  Transportation Needs:   . Lack of Transportation (Medical): Not on file  . Lack of Transportation (Non-Medical): Not on file  Physical Activity:   . Days of Exercise per Week: Not on file  . Minutes of Exercise per Session: Not on file  Stress:   . Feeling of Stress : Not on file  Social Connections:   . Frequency of Communication with Friends and Family: Not on file  .  Frequency of Social Gatherings with Friends and Family: Not on file  . Attends Religious Services: Not on file  . Active Member of Clubs or Organizations: Not on file  . Attends Archivist Meetings: Not on file  . Marital Status: Not on file   Allergies  Allergen Reactions  . Meloxicam Nausea And Vomiting   Family History  Problem Relation Age of Onset  . Hyperlipidemia Mother   . Depression Mother   . Anxiety disorder Mother   . Neuropathy Mother   . Fibromyalgia Mother   . Heart disease Father   . Depression Father   . Depression Maternal Uncle   . Bipolar disorder Maternal Grandmother      Current Outpatient Medications (Cardiovascular):  .  doxazosin (CARDURA) 4 MG tablet, Take 1/2-1 tab po QHS     Current Outpatient Medications (Other):  Marland Kitchen  buPROPion (WELLBUTRIN XL) 300 MG 24 hr tablet, TAKE 1 TABLET(300 MG) BY MOUTH DAILY .  Cholecalciferol (VITAMIN D PO), Take by mouth daily. .  cholecalciferol (VITAMIN D3) 25 MCG (1000 UT) tablet, Take 1,000 Units by mouth daily. Marland Kitchen  doxepin (SINEQUAN) 10 MG capsule, Take 1 capsule (10 mg total) by mouth at bedtime as needed. Marland Kitchen  MAGNESIUM PO,  Take by mouth. .  Misc Natural Products (APPLE CIDER VINEGAR DIET) TABS, Take by mouth. .  Multiple Vitamins-Minerals (MULTIVITAMIN PO), Take by mouth daily. .  Omega-3 Fatty Acids (FISH OIL PO), Take by mouth. .  sertraline (ZOLOFT) 100 MG tablet, Take 1.5 tablets (150 mg total) by mouth daily. .  traZODone (DESYREL) 50 MG tablet, Take 1 tablet (50 mg total) by mouth at bedtime as needed. .  Vitamin D, Ergocalciferol, (DRISDOL) 50000 units CAPS capsule, Take 1 capsule (50,000 Units total) by mouth every 7 (seven) days. (Patient not taking: Reported on 01/15/2019)   Reviewed prior external information including notes and imaging from  primary care provider As well as notes that were available from care everywhere and other healthcare systems.  Past medical history, social,  surgical and family history all reviewed in electronic medical record.  No pertanent information unless stated regarding to the chief complaint.   Review of Systems:  No headache, visual changes, nausea, vomiting, diarrhea, constipation, dizziness, abdominal pain, skin rash, fevers, chills, night sweats, weight loss, swollen lymph nodes, body aches, joint swelling, chest pain, shortness of breath, mood changes. POSITIVE muscle aches  Objective  There were no vitals taken for this visit.   General: No apparent distress alert and oriented x3 mood and affect normal, dressed appropriately.  HEENT: Pupils equal, extraocular movements intact  Respiratory: Patient's speak in full sentences and does not appear short of breath  Cardiovascular: No lower extremity edema, non tender, no erythema  Skin: Warm dry intact with no signs of infection or rash on extremities or on axial skeleton.  Abdomen: Soft nontender  Neuro: Cranial nerves II through XII are intact, neurovascularly intact in all extremities with 2+ DTRs and 2+ pulses.  Lymph: No lymphadenopathy of posterior or anterior cervical chain or axillae bilaterally.  Gait normal with good balance and coordination.  MSK:  Non tender with full range of motion and good stability and symmetric strength and tone of shoulders, elbows, wrist, hip, knee and ankles bilaterally.     Neck: Inspection unremarkable. No palpable stepoffs. Negative Spurling's maneuver. Full neck range of motion Grip strength and sensation normal in bilateral hands Strength good C4 to T1 distribution No sensory change to C4 to T1 Negative Hoffman sign bilaterally Reflexes normal    Osteopathic findings C3 flexed rotated and side bent right  C6 flexed rotated and side bent right  T4 extended rotated and side bent right inhaled rib T6 extended rotated and side bent left L2 flexed rotated and side bent right Sacrum right on right    Impression and Recommendations:      This case required medical decision making of moderate complexity. The above documentation has been reviewed and is accurate and complete Lyndal Pulley, DO       Note: This dictation was prepared with Dragon dictation along with smaller phrase technology. Any transcriptional errors that result from this process are unintentional.

## 2019-03-24 ENCOUNTER — Other Ambulatory Visit: Payer: Self-pay

## 2019-03-24 ENCOUNTER — Ambulatory Visit (INDEPENDENT_AMBULATORY_CARE_PROVIDER_SITE_OTHER): Payer: BC Managed Care – PPO | Admitting: Psychiatry

## 2019-03-24 DIAGNOSIS — F4312 Post-traumatic stress disorder, chronic: Secondary | ICD-10-CM

## 2019-03-24 NOTE — Progress Notes (Signed)
[facility] Counselor/Therapist Progress Note  Patient ID: [name], MRN: [id number],    Date: 03/24/2019  Time Spent: 50 minutes start time 2:01 PM time 2:51 PM  Treatment Type: Individual Therapy  Reported Symptoms: anxiety, triggered responses, depression, nightmares   Mental Status Exam:  Appearance:   Well Groomed     Behavior:  Sharing  Motor:  Normal  Speech/Language:   Normal Rate  Affect:  Appropriate  Mood:  sad  Thought process:  normal  Thought content:    WNL  Sensory/Perceptual disturbances:    Flashback  Orientation:  oriented to person, place, time/date and situation  Attention:  Good  Concentration:  Good  Memory:  WNL  Fund of knowledge:   Good  Insight:    Good  Judgment:   Good  Impulse Control:  Good   Risk Assessment: Danger to Self:  No Self-injurious Behavior: No Danger to Others: No Duty to Warn:no Physical Aggression / Violence:No  Access to Firearms a concern: No  Gang Involvement:No   Subjective: Patient was present for session.  She shared she has been having lots of triggered responses recently.  Patient explained her daughter is turning 28 and has started dating someone.  She shared she does not like the boy and is not sure how to tell her daughter.  Discussed the importance of her being close to her daughter and being able to discuss her concerns without addressing the issue directly.  Patient was reminded that one of her issues that she had no one to talk to growing up and so that is what is important for her to be able to have a relationship with her daughter that is healthy.  Patient was encouraged to recognize that as she is giving her daughter what she would have wanted at that age she needs to affirm herself and hopefully there will be some healing that will take place within herself as well.  Patient also reported she is feeling this pressure inside to go back up to [place] because her dad has prostate cancer and she is not  sure how much longer her grandmother will be alone around.  Patient expressed anxiety about having to get back up there and not being sure what she will be dealing with.  Patient was encouraged to take things 1 step at a time and using her coping skills that she prepares for her trip.  Patient shared that she and her husband are still having ups and downs.  Discussed some of the things that would going on in the relationship and that the discussion unfolded patient was able to recognize that sometimes she takes things personal that she does not need to.  Discussed different ways that she could manage that more appropriately and the importance of using her coping skills regularly even if she does not realize she is triggered.  Patient also agreed to get back to exercising as this kids return to school and she has some time for herself.  Interventions: Solution-Oriented/Positive Psychology  Diagnosis:   ICD-10-CM   1. Chronic post-traumatic stress disorder (PTSD)  F43.12     Plan: Patient is to use CBT and coping skills to manage triggered emotions appropriately.  Patient is to get back to trying to exercise regularly to take care of herself.  Patient is to to work on her relationship with her daughter. Long-term goal: Recall the traumatic event without becoming overwhelmed with negative emotions Short-term goal: Practice implement relaxation training as  a coping mechanism for tension panic stress anger and anxiety Sleep without being disturbed by dreams of the trauma  Lina Sayre, [facility]

## 2019-03-25 ENCOUNTER — Ambulatory Visit: Payer: BC Managed Care – PPO | Admitting: Psychiatry

## 2019-04-07 ENCOUNTER — Ambulatory Visit (INDEPENDENT_AMBULATORY_CARE_PROVIDER_SITE_OTHER): Payer: BC Managed Care – PPO | Admitting: Psychiatry

## 2019-04-07 ENCOUNTER — Other Ambulatory Visit: Payer: Self-pay

## 2019-04-07 DIAGNOSIS — F4312 Post-traumatic stress disorder, chronic: Secondary | ICD-10-CM | POA: Diagnosis not present
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
## 2019-04-21 ENCOUNTER — Other Ambulatory Visit: Payer: Self-pay

## 2019-04-21 ENCOUNTER — Encounter: Payer: Self-pay | Admitting: Psychiatry

## 2019-04-21 ENCOUNTER — Ambulatory Visit (INDEPENDENT_AMBULATORY_CARE_PROVIDER_SITE_OTHER): Payer: BC Managed Care – PPO | Admitting: Psychiatry

## 2019-04-21 DIAGNOSIS — F4312 Post-traumatic stress disorder, chronic: Secondary | ICD-10-CM
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
## 2019-05-05 ENCOUNTER — Other Ambulatory Visit: Payer: Self-pay

## 2019-05-05 ENCOUNTER — Ambulatory Visit (INDEPENDENT_AMBULATORY_CARE_PROVIDER_SITE_OTHER): Payer: BC Managed Care – PPO | Admitting: Psychiatry

## 2019-05-05 DIAGNOSIS — F4312 Post-traumatic stress disorder, chronic: Secondary | ICD-10-CM
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
## 2019-05-20 ENCOUNTER — Other Ambulatory Visit: Payer: Self-pay

## 2019-05-20 ENCOUNTER — Ambulatory Visit (INDEPENDENT_AMBULATORY_CARE_PROVIDER_SITE_OTHER): Payer: BC Managed Care – PPO | Admitting: Psychiatry

## 2019-05-20 DIAGNOSIS — F4312 Post-traumatic stress disorder, chronic: Secondary | ICD-10-CM
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
## 2019-05-29 ENCOUNTER — Encounter (HOSPITAL_BASED_OUTPATIENT_CLINIC_OR_DEPARTMENT_OTHER): Payer: Self-pay | Admitting: Emergency Medicine

## 2019-05-29 ENCOUNTER — Other Ambulatory Visit: Payer: Self-pay

## 2019-05-29 ENCOUNTER — Emergency Department (HOSPITAL_BASED_OUTPATIENT_CLINIC_OR_DEPARTMENT_OTHER)
Admission: EM | Admit: 2019-05-29 | Discharge: 2019-05-29 | Disposition: A | Discharge status: Home / Self Care | Payer: BC Managed Care – PPO | Attending: Emergency Medicine | Admitting: Emergency Medicine

## 2019-05-29 DIAGNOSIS — T402X5A Adverse effect of other opioids, initial encounter: Secondary | ICD-10-CM | POA: Insufficient documentation

## 2019-05-29 DIAGNOSIS — Z79899 Other long term (current) drug therapy: Secondary | ICD-10-CM | POA: Insufficient documentation

## 2019-05-29 DIAGNOSIS — K5903 Drug induced constipation: Secondary | ICD-10-CM | POA: Diagnosis not present

## 2019-05-29 DIAGNOSIS — R29818 Other symptoms and signs involving the nervous system: Secondary | ICD-10-CM

## 2019-05-29 DIAGNOSIS — N201 Calculus of ureter: Secondary | ICD-10-CM

## 2019-05-29 DIAGNOSIS — R1032 Left lower quadrant pain: Secondary | ICD-10-CM | POA: Diagnosis present

## 2019-05-29 LAB — URINALYSIS, ROUTINE W REFLEX MICROSCOPIC
Bilirubin Urine: NEGATIVE
Glucose, UA: NEGATIVE mg/dL
Ketones, ur: NEGATIVE mg/dL
Leukocytes,Ua: NEGATIVE
Nitrite: NEGATIVE
Protein, ur: NEGATIVE mg/dL
Specific Gravity, Urine: 1.02 (ref 1.005–1.030)
pH: 6 (ref 5.0–8.0)

## 2019-05-29 LAB — PREGNANCY, URINE: Preg Test, Ur: NEGATIVE

## 2019-05-29 LAB — URINALYSIS, MICROSCOPIC (REFLEX)

## 2019-05-29 MED ORDER — HYDROMORPHONE HCL 1 MG/ML IJ SOLN
1.0000 mg | Freq: Once | INTRAMUSCULAR | Status: AC
  Administered 2019-05-29: 1 mg via INTRAVENOUS
  Filled 2019-05-29: qty 1

## 2019-05-29 MED ORDER — HYDROMORPHONE HCL 2 MG PO TABS: 2.0000 mg | ORAL_TABLET | ORAL | 0 refills | Status: DC | PRN

## 2019-05-29 MED ORDER — ONDANSETRON HCL 4 MG/2ML IJ SOLN
4.0000 mg | Freq: Once | INTRAMUSCULAR | Status: AC
  Administered 2019-05-29: 4 mg via INTRAVENOUS
  Filled 2019-05-29: qty 2

## 2019-05-29 NOTE — ED Triage Notes (Signed)
Pt states she has 2 kidney stones on the left side and is having flank pain  Pt states she was seen at urgent care on Tuesday and was given medication for nausea, and tramadol  Pt states she is having constipation as well

## 2019-05-29 NOTE — ED Provider Notes (Signed)
[facility] Provider Note: Georgena Spurling, MD, FACEP  CSN: [id number] MRN: [id number] ARRIVAL: 05/29/19 at [place]: [id number]   CHIEF COMPLAINT  Flank Pain   HISTORY OF PRESENT ILLNESS  05/29/19 4:23 AM [name] is a 44 y.o. female left flank pain for the past week.  She was seen in urgent care and they arranged for a CT scan that showed 2 nonobstructing 3 mm renal pelvis stones.  She was treated with Flomax, Keflex, Zofran and Ultram.  Her pain acutely worsened yesterday evening about 7 PM and she now rates it as an 8 out of 10.  It is sharp and not significantly changed with movement or palpation.  She has had nausea but no vomiting.  She has also been constipated.  This is perhaps due to the tramadol she was prescribed.  She has not gotten adequate pain relief from her medications.   Past Medical History:  Diagnosis Date  . Anxiety   . Back pain   . Chronic hip pain   . Depression   . GERD (gastroesophageal reflux disease)     Past Surgical History:  Procedure Laterality Date  . DILATION AND CURETTAGE OF UTERUS    . FOOT SURGERY    . MOUTH SURGERY      Family History  Problem Relation Age of Onset  . Hyperlipidemia Mother   . Depression Mother   . Anxiety disorder Mother   . Neuropathy Mother   . Fibromyalgia Mother   . Heart disease Father   . Depression Father   . Depression Maternal Uncle   . Bipolar disorder Maternal Grandmother     Social History   Tobacco Use  . Smoking status: Never Smoker  . Smokeless tobacco: Never Used  Substance Use Topics  . Alcohol use: Yes    Comment: Occasionally  . Drug use: No    Prior to Admission medications   Medication Sig Start Date End Date Taking? Authorizing Provider  buPROPion (WELLBUTRIN XL) 300 MG 24 hr tablet TAKE 1 TABLET(300 MG) BY MOUTH DAILY 03/03/19   Thayer Headings, PMHNP  cholecalciferol (VITAMIN D3) 25 MCG (1000 UT) tablet Take 1,000 Units by mouth daily.    [provider]  doxazosin (CARDURA) 4 MG tablet Take 1/2-1 tab po QHS 01/15/19   Thayer Headings, PMHNP  doxepin (SINEQUAN) 10 MG capsule Take 1 capsule (10 mg total) by mouth at bedtime as needed. 01/15/19 02/14/19  Thayer Headings, PMHNP  HYDROmorphone (DILAUDID) 2 MG tablet Take 1 tablet (2 mg total) by mouth every 4 (four) hours as needed for severe pain. 05/29/19   Melessia Kaus, MD  MAGNESIUM PO Take by mouth.    [provider]  Misc Natural Products (APPLE CIDER VINEGAR DIET) TABS Take by mouth.    [provider]  Multiple Vitamins-Minerals (MULTIVITAMIN PO) Take by mouth daily.    [provider]  Omega-3 Fatty Acids (FISH OIL PO) Take by mouth.    [provider]  sertraline (ZOLOFT) 100 MG tablet Take 1.5 tablets (150 mg total) by mouth daily. 01/15/19 02/14/19  Thayer Headings, PMHNP  traZODone (DESYREL) 50 MG tablet Take 1 tablet (50 mg total) by mouth at bedtime as needed. 01/15/19 02/14/19  Thayer Headings, PMHNP    Allergies Meloxicam   REVIEW OF SYSTEMS  Negative except as noted here or in the History of Present Illness.   PHYSICAL EXAMINATION  Initial Vital Signs Blood pressure 114/75, pulse (!) 109, temperature 98.7 F (37.1  C), temperature source Oral, resp. rate 16, height 5' 2"  (1.575 m), weight 70.3 kg, last menstrual period 05/24/2019, SpO2 99 %.  Examination General: Well-developed, well-nourished female in no acute distress; appearance consistent with age of record HENT: normocephalic; atraumatic Eyes: pupils equal, round and reactive to light; extraocular muscles intact Neck: supple Heart: regular rate and rhythm Lungs: clear to auscultation bilaterally Abdomen: soft; nondistended; nontender; no masses or hepatosplenomegaly; bowel sounds present GU: Mild left CVA tenderness Extremities: No deformity; full range of motion; pulses normal Neurologic: Awake, alert and oriented; motor function intact in all extremities and symmetric; no  facial droop Skin: Warm and dry Psychiatric: Normal mood and affect   RESULTS  Summary of this visit's results, reviewed and interpreted by myself:   EKG Interpretation  Date/Time:    Ventricular Rate:    PR Interval:    QRS Duration:   QT Interval:    QTC Calculation:   R Axis:     Text Interpretation:        Laboratory Studies: Results for orders placed or performed during the hospital encounter of 05/29/19 (from the past 24 hour(s))  Urinalysis, Routine w reflex microscopic     Status: Abnormal   Collection Time: 05/29/19  3:32 AM  Result Value Ref Range   Color, Urine YELLOW YELLOW   APPearance CLEAR CLEAR   Specific Gravity, Urine 1.020 1.005 - 1.030   pH 6.0 5.0 - 8.0   Glucose, UA NEGATIVE NEGATIVE mg/dL   Hgb urine dipstick TRACE (A) NEGATIVE   Bilirubin Urine NEGATIVE NEGATIVE   Ketones, ur NEGATIVE NEGATIVE mg/dL   Protein, ur NEGATIVE NEGATIVE mg/dL   Nitrite NEGATIVE NEGATIVE   Leukocytes,Ua NEGATIVE NEGATIVE  Pregnancy, urine     Status: None   Collection Time: 05/29/19  3:32 AM  Result Value Ref Range   Preg Test, Ur NEGATIVE NEGATIVE  Urinalysis, Microscopic (reflex)     Status: Abnormal   Collection Time: 05/29/19  3:32 AM  Result Value Ref Range   RBC / HPF 0-5 0 - 5 RBC/hpf   WBC, UA 0-5 0 - 5 WBC/hpf   Bacteria, UA RARE (A) NONE SEEN   Squamous Epithelial / LPF 0-5 0 - 5   Imaging Studies: No results found.  ED COURSE and MDM  Nursing notes, initial and subsequent vitals signs, including pulse oximetry, reviewed and interpreted by myself.  Vitals:   05/29/19 0209 05/29/19 0212 05/29/19 0518  BP:  114/75 (!) 111/58  Pulse:  (!) 109 73  Resp:  16 14  Temp:  98.7 F (37.1 C)   TempSrc:  Oral   SpO2:  99% 97%  Weight: 70.3 kg    Height: 5' 2"  (1.575 m)     Medications  ondansetron (ZOFRAN) injection 4 mg (4 mg Intravenous Given 05/29/19 0456)  HYDROmorphone (DILAUDID) injection 1 mg (1 mg Intravenous Given 05/29/19 0456)   5:51  AM Pain well controlled with IV Dilaudid.  Patient advised that tramadol is a poor analgesic in the setting of kidney stones.  We will have her discontinue the tramadol and use Dilaudid instead.  Since there is no evidence of urinary tract infection she was advised to discontinue the Keflex.  She was advised her constipation may be due to tramadol use and will warm will certainly worsen with Dilaudid use.  She was advised to use a stool softener or laxative while taking opioid medications.   PROCEDURES  Procedures   ED DIAGNOSES     ICD-10-CM  1. Ureterolithiasis  N20.1   2. Inadequate analgesia  R29.818   3. Constipation due to opioid therapy  K59.03    T40.2X5A        Correne Lalani, Jenny Reichmann, MD 05/29/19 [phone]

## 2019-05-29 NOTE — ED Notes (Signed)
Attempted IV twice without success

## 2019-06-03 ENCOUNTER — Ambulatory Visit: Payer: BC Managed Care – PPO | Admitting: Psychiatry

## 2019-06-03 ENCOUNTER — Other Ambulatory Visit: Payer: Self-pay

## 2019-06-03 DIAGNOSIS — F99 Mental disorder, not otherwise specified: Secondary | ICD-10-CM

## 2019-06-03 DIAGNOSIS — F5105 Insomnia due to other mental disorder: Secondary | ICD-10-CM

## 2019-06-03 MED ORDER — TRAZODONE HCL 50 MG PO TABS: 50.0000 mg | ORAL_TABLET | Freq: Every evening | ORAL | 5 refills | Status: DC | PRN

## 2019-06-17 ENCOUNTER — Ambulatory Visit (INDEPENDENT_AMBULATORY_CARE_PROVIDER_SITE_OTHER): Payer: BC Managed Care – PPO | Admitting: Psychiatry

## 2019-06-17 ENCOUNTER — Other Ambulatory Visit: Payer: Self-pay

## 2019-06-17 DIAGNOSIS — F4310 Post-traumatic stress disorder, unspecified: Secondary | ICD-10-CM
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
## 2019-07-07 ENCOUNTER — Other Ambulatory Visit: Payer: Self-pay

## 2019-07-07 ENCOUNTER — Encounter (INDEPENDENT_AMBULATORY_CARE_PROVIDER_SITE_OTHER): Payer: Self-pay

## 2019-07-07 ENCOUNTER — Ambulatory Visit (INDEPENDENT_AMBULATORY_CARE_PROVIDER_SITE_OTHER): Payer: BC Managed Care – PPO | Admitting: Psychiatry

## 2019-07-07 DIAGNOSIS — F4310 Post-traumatic stress disorder, unspecified: Secondary | ICD-10-CM
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
## 2019-07-14 ENCOUNTER — Ambulatory Visit: Payer: BC Managed Care – PPO | Admitting: Psychiatry

## 2019-08-14 ENCOUNTER — Other Ambulatory Visit: Payer: Self-pay

## 2019-08-14 DIAGNOSIS — F4310 Post-traumatic stress disorder, unspecified: Secondary | ICD-10-CM

## 2019-08-14 MED ORDER — SERTRALINE HCL 100 MG PO TABS: 150.0000 mg | ORAL_TABLET | Freq: Every day | ORAL | 0 refills | Status: DC

## 2019-08-17 ENCOUNTER — Encounter: Payer: Self-pay | Admitting: Psychiatry

## 2019-08-17 ENCOUNTER — Other Ambulatory Visit: Payer: Self-pay

## 2019-08-17 ENCOUNTER — Ambulatory Visit (INDEPENDENT_AMBULATORY_CARE_PROVIDER_SITE_OTHER): Payer: BC Managed Care – PPO | Admitting: Psychiatry

## 2019-08-17 VITALS — BP 107/69 | HR 77

## 2019-08-17 DIAGNOSIS — F515 Nightmare disorder: Secondary | ICD-10-CM | POA: Diagnosis not present

## 2019-08-17 DIAGNOSIS — F330 Major depressive disorder, recurrent, mild: Secondary | ICD-10-CM

## 2019-08-17 DIAGNOSIS — F4310 Post-traumatic stress disorder, unspecified: Secondary | ICD-10-CM | POA: Diagnosis not present

## 2019-08-17 DIAGNOSIS — F5105 Insomnia due to other mental disorder: Secondary | ICD-10-CM

## 2019-08-17 DIAGNOSIS — F99 Mental disorder, not otherwise specified: Secondary | ICD-10-CM

## 2019-08-17 MED ORDER — TRAZODONE HCL 50 MG PO TABS: 50.0000 mg | ORAL_TABLET | Freq: Every evening | ORAL | 1 refills | Status: DC | PRN

## 2019-08-17 MED ORDER — ALPRAZOLAM 0.5 MG PO TABS: ORAL_TABLET | ORAL | 2 refills | Status: DC

## 2019-08-17 MED ORDER — BUPROPION HCL ER (XL) 300 MG PO TB24: 300.0000 mg | ORAL_TABLET | Freq: Every day | ORAL | 1 refills | Status: DC

## 2019-08-17 MED ORDER — PRAZOSIN HCL 1 MG PO CAPS: ORAL_CAPSULE | ORAL | 2 refills | Status: DC

## 2019-08-17 MED ORDER — SERTRALINE HCL 100 MG PO TABS: 150.0000 mg | ORAL_TABLET | Freq: Every day | ORAL | 1 refills | Status: DC

## 2019-08-17 NOTE — Progress Notes (Signed)
[name] [id number] 12-26-1975 44 y.o.  Subjective:   Patient ID:  [name] is a 44 y.o. (DOB 1975/06/13) female.  Chief Complaint:  Chief Complaint  Patient presents with  . Follow-up    Anxiety, Depression, and insomnia    HPI [name] presents to the office today for follow-up of depression, anxiety, and insomnia. She reports that her anxiety has been "up and down a little bit." She has had some episodes of increased anxiety and panic in response to certain triggers. She reports that there have been a few instances where it has been difficult to calm down afterwards. She reports that panic typically has an abrupt onset and can vary in duration. She reports that generalized anxiety has been ok. She reports that she has some intrusive memories and re-experiencing "but I am able to deal with it a lot better." She reports that her nightmares have significantly improved. Has stopped Doxazosin due to excessive somnolence. She reports that she has been sleep eating and this is long-standing. Reports that she has gained approximately 20 lbs. Has been trying to lose weight during the day. Denies depressed mood. Energy and motivation have been good. She reports adequate concentration. Denies SI.   Past Psychiatric Medication Trials: EffexorXR Wellbutrin- Causes GI upset in the morning. Prefers to take  Trazodone- Reports that she has taken it intermittently. Reports that 25 mg is ineffective. Sertraline Doxazosin- helpful for nightmares. Noticed some fatigue Silenor- Reports that 3 mg was effective but cost prohibitive. Doxepin- Excessive somnolence    Review of Systems:  Review of Systems  Cardiovascular: Negative for palpitations.  Musculoskeletal: Negative for gait problem.  Neurological: Negative for dizziness and tremors.  Psychiatric/Behavioral:       Please refer to HPI   Had kidney stones in late April and May  Medications: I have reviewed the patient's current  medications.  Current Outpatient Medications  Medication Sig Dispense Refill  . buPROPion (WELLBUTRIN XL) 300 MG 24 hr tablet Take 1 tablet (300 mg total) by mouth daily. 90 tablet 1  . cholecalciferol (VITAMIN D3) 25 MCG (1000 UT) tablet Take 1,000 Units by mouth daily.    Marland Kitchen MAGNESIUM PO Take by mouth.    . Multiple Vitamins-Minerals (MULTIVITAMIN PO) Take by mouth daily.    Marland Kitchen omeprazole (PRILOSEC OTC) 20 MG tablet Take 20 mg by mouth daily.    . Probiotic Product (PROBIOTIC PO) Take by mouth.    . sertraline (ZOLOFT) 100 MG tablet Take 1.5 tablets (150 mg total) by mouth daily. 135 tablet 1  . traZODone (DESYREL) 50 MG tablet Take 1 tablet (50 mg total) by mouth at bedtime as needed. 90 tablet 1  . ALPRAZolam (XANAX) 0.5 MG tablet Take 1/2-1 tab po BID prn anxiety/panic 30 tablet 2  . HYDROmorphone (DILAUDID) 2 MG tablet Take 1 tablet (2 mg total) by mouth every 4 (four) hours as needed for severe pain. (Patient not taking: Reported on 08/17/2019) 30 tablet 0  . Omega-3 Fatty Acids (FISH OIL PO) Take by mouth. (Patient not taking: Reported on 08/17/2019)    . prazosin (MINIPRESS) 1 MG capsule Take 1 capsule po QHS for 3 nights, then increase to 2 capsules po QHS for 3 nights, then may increase to 3 capsules po QHS if needed. 90 capsule 2   No current facility-administered medications for this visit.    Medication Side Effects: None  Allergies:  Allergies  Allergen Reactions  . Meloxicam Nausea And Vomiting    Past Medical  History:  Diagnosis Date  . Anxiety   . Back pain   . Chronic hip pain   . Depression   . GERD (gastroesophageal reflux disease)     Family History  Problem Relation Age of Onset  . Hyperlipidemia Mother   . Depression Mother   . Anxiety disorder Mother   . Neuropathy Mother   . Fibromyalgia Mother   . Heart disease Father   . Depression Father   . Depression Maternal Uncle   . Bipolar disorder Maternal Grandmother     Social History    Socioeconomic History  . Marital status: Married    Spouse name: Not on file  . Number of children: 3  . Years of education: 2 years of college  . Highest education level: Not on file  Occupational History  . Occupation: Homemaker  Tobacco Use  . Smoking status: Never Smoker  . Smokeless tobacco: Never Used  Vaping Use  . Vaping Use: Never used  Substance and Sexual Activity  . Alcohol use: Yes    Comment: Occasionally  . Drug use: No  . Sexual activity: Not on file  Other Topics Concern  . Not on file  Social History Narrative   Lives at home with her husband and children.   Right-handed.   1 cup caffeine day.   Social Determinants of Health   Financial Resource Strain:   . Difficulty of Paying Living Expenses:   Food Insecurity:   . Worried About Charity fundraiser in the Last Year:   . Arboriculturist in the Last Year:   Transportation Needs:   . Film/video editor (Medical):   Marland Kitchen Lack of Transportation (Non-Medical):   Physical Activity:   . Days of Exercise per Week:   . Minutes of Exercise per Session:   Stress:   . Feeling of Stress :   Social Connections:   . Frequency of Communication with Friends and Family:   . Frequency of Social Gatherings with Friends and Family:   . Attends Religious Services:   . Active Member of Clubs or Organizations:   . Attends Archivist Meetings:   Marland Kitchen Marital Status:   Intimate Partner Violence:   . Fear of Current or Ex-Partner:   . Emotionally Abused:   Marland Kitchen Physically Abused:   . Sexually Abused:     Past Medical History, Surgical history, Social history, and Family history were reviewed and updated as appropriate.   Please see review of systems for further details on the patient's review from today.   Objective:   Physical Exam:  BP 107/69   Pulse 77   Physical Exam Constitutional:      General: She is not in acute distress. Musculoskeletal:        General: No deformity.  Neurological:      Mental Status: She is alert and oriented to person, place, and time.     Coordination: Coordination normal.  Psychiatric:        Attention and Perception: Attention and perception normal. She does not perceive auditory or visual hallucinations.        Mood and Affect: Mood normal. Mood is not anxious or depressed. Affect is not labile, blunt, angry or inappropriate.        Speech: Speech normal.        Behavior: Behavior normal.        Thought Content: Thought content normal. Thought content is not paranoid or delusional. Thought content does not  include homicidal or suicidal ideation. Thought content does not include homicidal or suicidal plan.        Cognition and Memory: Cognition and memory normal.        Judgment: Judgment normal.     Comments: Insight intact     Lab Review:  No results found for: NA, K, CL, CO2, GLUCOSE, BUN, CREATININE, CALCIUM, PROT, ALBUMIN, AST, ALT, ALKPHOS, BILITOT, GFRNONAA, GFRAA     Component Value Date/Time   WBC 15.8 (H) 04/08/2007 0338   RBC 4.21 04/08/2007 0338   HGB 13.4 04/08/2007 0338   HCT 39.5 04/08/2007 0338   PLT 291 04/08/2007 0338   MCV 93.9 04/08/2007 0338   MCHC 33.8 04/08/2007 0338   RDW 16.5 (H) 04/08/2007 0338    No results found for: POCLITH, LITHIUM   No results found for: PHENYTOIN, PHENOBARB, VALPROATE, CBMZ   .res Assessment: Plan:   Discussed potential benefits, risk, and side effects of benzodiazepines to include potential risk of tolerance and dependence, as well as possible drowsiness.  Advised patient not to drive if experiencing drowsiness and to take lowest possible effective dose to minimize risk of dependence and tolerance.  Will start Xanax 0.5 mg 1/2 to 1 tablet twice daily as needed for anxiety and panic. Discussed potential benefits, risks, and side effects of prazosin for nightmares.  Discussed that prazosin has a shorter half-life compared to doxazosin and therefore may be less likely to cause fatigue during the  day.  Patient agrees to trial of prazosin.  Advised patient to start prazosin 1 mg at bedtime for 3 nights and then increase to 2 mg at bedtime as tolerated.  Discussed that she could then increase to 3 mg after 3 nights if needed. Continue sertraline 150 mg daily for anxiety and depression. Continue Wellbutrin XL 300 mg daily for depression. Continue trazodone for insomnia. Recommend continuing psychotherapy with Lina Sayre, [facility]. Patient to follow-up with this provider in 6 months or sooner if clinically indicated. Patient advised to contact office with any questions, adverse effects, or acute worsening in signs and symptoms.   [name] was seen today for follow-up.  Diagnoses and all orders for this visit:  Nightmares -     prazosin (MINIPRESS) 1 MG capsule; Take 1 capsule po QHS for 3 nights, then increase to 2 capsules po QHS for 3 nights, then may increase to 3 capsules po QHS if needed.  Mild episode of recurrent major depressive disorder (HCC) -     buPROPion (WELLBUTRIN XL) 300 MG 24 hr tablet; Take 1 tablet (300 mg total) by mouth daily.  PTSD (post-traumatic stress disorder) -     ALPRAZolam (XANAX) 0.5 MG tablet; Take 1/2-1 tab po BID prn anxiety/panic -     sertraline (ZOLOFT) 100 MG tablet; Take 1.5 tablets (150 mg total) by mouth daily.  Insomnia due to other mental disorder -     traZODone (DESYREL) 50 MG tablet; Take 1 tablet (50 mg total) by mouth at bedtime as needed.     Please see After Visit Summary for patient specific instructions.  Future Appointments  Date Time Provider [place]  09/03/2019 10:00 AM Lina Sayre, [facility] CP-CP None  09/24/2019 11:00 AM Lina Sayre, [facility] CP-CP None  02/17/2020 10:00 AM Thayer Headings, [facility] CP-CP None    No orders of the defined types were placed in this encounter.   -------------------------------

## 2019-09-03 ENCOUNTER — Other Ambulatory Visit: Payer: Self-pay

## 2019-09-03 ENCOUNTER — Ambulatory Visit (INDEPENDENT_AMBULATORY_CARE_PROVIDER_SITE_OTHER): Payer: BC Managed Care – PPO | Admitting: Psychiatry

## 2019-09-03 DIAGNOSIS — F4310 Post-traumatic stress disorder, unspecified: Secondary | ICD-10-CM | POA: Diagnosis not present
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
## 2019-09-24 ENCOUNTER — Ambulatory Visit: Payer: BC Managed Care – PPO | Admitting: Psychiatry

## 2019-10-09 ENCOUNTER — Ambulatory Visit (INDEPENDENT_AMBULATORY_CARE_PROVIDER_SITE_OTHER): Payer: BC Managed Care – PPO | Admitting: Psychiatry

## 2019-10-09 ENCOUNTER — Other Ambulatory Visit: Payer: Self-pay

## 2019-10-09 DIAGNOSIS — F4310 Post-traumatic stress disorder, unspecified: Secondary | ICD-10-CM | POA: Diagnosis not present

## 2019-10-09 NOTE — Progress Notes (Signed)
[facility] Counselor/Therapist Progress Note  Patient ID: [name], MRN: [id number],    Date: 10/09/2019  Time Spent: 52 minutes start time 12:04 PM end time 12:56 PM  Treatment Type: Individual Therapy  Reported Symptoms: anxiety, sadness, triggered responses  Mental Status Exam:  Appearance:   Casual and Neat     Behavior:  Appropriate  Motor:  Normal  Speech/Language:   Normal Rate  Affect:  Appropriate  Mood:  normal  Thought process:  normal  Thought content:    WNL  Sensory/Perceptual disturbances:    WNL  Orientation:  oriented to person, place, time/date and situation  Attention:  Good  Concentration:  Good  Memory:  WNL  Fund of knowledge:   Good  Insight:    Good  Judgment:   Good  Impulse Control:  Good   Risk Assessment: Danger to Self:  No Self-injurious Behavior: No Danger to Others: No Duty to Warn:no Physical Aggression / Violence:No  Access to Firearms a concern: No  Gang Involvement:No   Subjective: Patient was present for session. She shared that is still struggling with situations at her home.  Patient stated her mother-in-law just left and it was very negative and toxic as it typically is.  Patient stated that she and her husband have had some pretty significant talks that have been very difficult and triggering for her.  She shared that he is starting to share that he is been very unhappy.  She shared that it has been going on for 5 years and he is just finally acknowledging it which is difficult to get a relief for her.  Patient was encouraged to recognize the difference in the female and female way of thinking and to think through how she wants to share her concerns and thoughts with him.  Patient reported she has been upset by different comments made by other people regarding how she handles different situations.  She was encouraged to recognize that everyone have an opinion but does not mean that she needs to give it energy.  Ways to filter  more appropriate self talk were discussed in session and plans were developed with patient.  Patient was able to recognize that she is doing some things very well with her children.  She was encouraged to write down what she wants for her children and to have her husband do the same and for them to recognize what they are already doing and then different strategies to help move things in that direction more as a team.  They were also encouraged to write out what they wanted further lives and ways to make those things happen as well.  Interventions: Cognitive Behavioral Therapy and Solution-Oriented/Positive Psychology  Diagnosis:   ICD-10-CM   1. PTSD (post-traumatic stress disorder)  F43.10     Plan: Patient is to focus on using CBT and coping skills to decrease triggered responses.  Patient is to write out the things that are going well with her children and in her life and as well as the things that she wants to have going in her life and with her children.  She is to use the list to can communicate with her husband about his similar list.  Patient is to continue finding things that she can do to relax and to feel positive about who she is. Long-term goal: Recall the traumatic event without becoming overwhelming negative emotions Short-term goal: Practice implement relaxation training as a coping mechanism for tension panic stress  anger and anxiety.  Sleep without being disturbed by dreams of trauma  Lina Sayre, [facility]

## 2019-10-22 ENCOUNTER — Other Ambulatory Visit: Payer: Self-pay

## 2019-10-22 ENCOUNTER — Ambulatory Visit (INDEPENDENT_AMBULATORY_CARE_PROVIDER_SITE_OTHER): Payer: BC Managed Care – PPO | Admitting: Psychiatry

## 2019-10-22 DIAGNOSIS — F4310 Post-traumatic stress disorder, unspecified: Secondary | ICD-10-CM | POA: Diagnosis not present
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
## 2019-11-05 ENCOUNTER — Other Ambulatory Visit: Payer: Self-pay

## 2019-11-05 ENCOUNTER — Ambulatory Visit (INDEPENDENT_AMBULATORY_CARE_PROVIDER_SITE_OTHER): Payer: BC Managed Care – PPO | Admitting: Psychiatry

## 2019-11-05 DIAGNOSIS — F4310 Post-traumatic stress disorder, unspecified: Secondary | ICD-10-CM
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
## 2019-11-19 ENCOUNTER — Ambulatory Visit: Payer: BC Managed Care – PPO | Admitting: Psychiatry

## 2019-11-30 ENCOUNTER — Other Ambulatory Visit: Payer: Self-pay

## 2019-11-30 DIAGNOSIS — F515 Nightmare disorder: Secondary | ICD-10-CM

## 2019-11-30 MED ORDER — PRAZOSIN HCL 1 MG PO CAPS: ORAL_CAPSULE | ORAL | 2 refills | Status: DC

## 2019-12-08 ENCOUNTER — Other Ambulatory Visit: Payer: Self-pay

## 2019-12-08 ENCOUNTER — Ambulatory Visit (INDEPENDENT_AMBULATORY_CARE_PROVIDER_SITE_OTHER): Payer: BC Managed Care – PPO | Admitting: Psychiatry

## 2019-12-08 DIAGNOSIS — F4310 Post-traumatic stress disorder, unspecified: Secondary | ICD-10-CM | POA: Diagnosis not present
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
## 2019-12-29 ENCOUNTER — Ambulatory Visit (INDEPENDENT_AMBULATORY_CARE_PROVIDER_SITE_OTHER): Payer: BC Managed Care – PPO | Admitting: Psychiatry

## 2019-12-29 ENCOUNTER — Other Ambulatory Visit: Payer: Self-pay

## 2019-12-29 DIAGNOSIS — F4310 Post-traumatic stress disorder, unspecified: Secondary | ICD-10-CM
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
## 2020-01-14 ENCOUNTER — Ambulatory Visit (INDEPENDENT_AMBULATORY_CARE_PROVIDER_SITE_OTHER): Payer: BC Managed Care – PPO | Admitting: Psychiatry

## 2020-01-14 ENCOUNTER — Other Ambulatory Visit: Payer: Self-pay

## 2020-01-14 DIAGNOSIS — F4310 Post-traumatic stress disorder, unspecified: Secondary | ICD-10-CM

## 2020-01-14 NOTE — Progress Notes (Signed)
[facility] Counselor/Therapist Progress Note  Patient ID: [name], MRN: [id number],    Date: 01/14/2020  Time Spent: 43 Minutes start time 9:07 AM end 9:59 AM  Treatment Type: Individual Therapy  Reported Symptoms: anxiety, sleep issues, sadness  Mental Status Exam:  Appearance:   Well Groomed     Behavior:  Appropriate  Motor:  Normal  Speech/Language:   Normal Rate  Affect:  Appropriate  Mood:  anxious and sad  Thought process:  normal  Thought content:    WNL  Sensory/Perceptual disturbances:    WNL  Orientation:  oriented to person, place, time/date and situation  Attention:  Good  Concentration:  Good  Memory:  WNL  Fund of knowledge:   Good  Insight:    Good  Judgment:   Good  Impulse Control:  Good   Risk Assessment: Danger to Self:  No Self-injurious Behavior: No Danger to Others: No Duty to Warn:no Physical Aggression / Violence:No  Access to Firearms a concern: No  Gang Involvement:No   Subjective: Patient was present for session.  She shared that she has been eating in the middle of the night and she only knows due to finding wrappers in the bed.  Encourage patient to share issues with her provider to see if any medications needed to be changed.  She went on to share that she ended a friendship due to feeling that the relationship was very 1 sided and not healthy. She shared there is sadness over the situation. Revised treatment plan with patient due to progress on other goals.  Could not sign due to COVID.  Patient was able to recognize the fact that she is not getting triggered as much and she is starting to be able to set limits when it comes to her in-laws and to her family.  Patient shared that it still very difficult for her to deal with the fact that her father seems to do what her brother wants and does not seem to do what she wants.  Had patient think through what she was saying and to recognize the fact that she has never felt positive about  her father's choices and the way he raised her.  At the same time she was very close to her grandfather and he treated her very positively.  Discussed the fact that she shared her father and grandfather did not like each other and that probably played a role in the dynamic with her and her brother.  The importance of her using her affirmations and reminding the younger parts of her that she is enough and that she does not need her father's approval because she did have her grandfather's approval and she also is pleased with her choices.  Patient was able to discuss different ways that she is very different from her parents and how her children have different relationships with them than she had with both of her parents.  Encourage patient to focus on the fact that she can control fix and change those things and that is what is important.  Patient agreed to work on reminding herself of the positive things that she is done is apparent and to allow herself to feel positive about those things.  Interventions: Cognitive Behavioral Therapy and Solution-Oriented/Positive Psychology  Diagnosis:   ICD-10-CM   1. PTSD (post-traumatic stress disorder)  F43.10     Plan: Patient is to use CBT and coping skills to decrease triggered responses.  Patient is to work on  reminding herself of the ways that she is different from her parents and how that is a positive thing.  Patient is to focus on the things that she can control fix and change.  Patient is to affirm herself regularly as well as affirming her husband.  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 Long term goal: Develop and implement effective coping skills that allow for carrying out normal responsibilities and participating  Relationships and social activities- practice appropriate limit setting Short term goal: Practice implement relaxation training as a coping mechanism for tension, panic, stress, anger, and anxiety    Lina Sayre, [facility]

## 2020-01-27 ENCOUNTER — Other Ambulatory Visit: Payer: Self-pay

## 2020-01-27 ENCOUNTER — Ambulatory Visit (INDEPENDENT_AMBULATORY_CARE_PROVIDER_SITE_OTHER): Payer: BC Managed Care – PPO | Admitting: Psychiatry

## 2020-01-27 ENCOUNTER — Telehealth: Payer: Self-pay | Admitting: Psychiatry

## 2020-01-27 DIAGNOSIS — F4310 Post-traumatic stress disorder, unspecified: Secondary | ICD-10-CM | POA: Diagnosis not present

## 2020-01-27 DIAGNOSIS — F330 Major depressive disorder, recurrent, mild: Secondary | ICD-10-CM

## 2020-01-27 MED ORDER — BUPROPION HCL ER (XL) 300 MG PO TB24: 300.0000 mg | ORAL_TABLET | Freq: Every day | ORAL | 1 refills | Status: DC

## 2020-01-27 NOTE — Telephone Encounter (Signed)
Received fax refill request from pharmacy for Wellbutrin XL 300 milligrams.  Prescription sent.
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
## 2020-02-10 ENCOUNTER — Ambulatory Visit (INDEPENDENT_AMBULATORY_CARE_PROVIDER_SITE_OTHER): Payer: BC Managed Care – PPO | Admitting: Psychiatry

## 2020-02-10 ENCOUNTER — Other Ambulatory Visit: Payer: Self-pay

## 2020-02-10 DIAGNOSIS — F4312 Post-traumatic stress disorder, chronic: Secondary | ICD-10-CM
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
## 2020-02-17 ENCOUNTER — Ambulatory Visit: Payer: BC Managed Care – PPO | Admitting: Psychiatry

## 2020-02-19 ENCOUNTER — Telehealth: Payer: Self-pay | Admitting: Psychiatry

## 2020-02-19 DIAGNOSIS — F99 Mental disorder, not otherwise specified: Secondary | ICD-10-CM

## 2020-02-19 DIAGNOSIS — F5105 Insomnia due to other mental disorder: Secondary | ICD-10-CM

## 2020-02-19 MED ORDER — TRAZODONE HCL 50 MG PO TABS: 50.0000 mg | ORAL_TABLET | Freq: Every evening | ORAL | 1 refills | Status: DC | PRN

## 2020-02-19 NOTE — Telephone Encounter (Signed)
Rec'd fax refill request from pharmacy for Trazodone. Refill sent.

## 2020-02-24 ENCOUNTER — Ambulatory Visit: Payer: BC Managed Care – PPO | Admitting: Psychiatry

## 2020-02-25 ENCOUNTER — Telehealth: Payer: Self-pay | Admitting: Psychiatry

## 2020-02-25 DIAGNOSIS — F4310 Post-traumatic stress disorder, unspecified: Secondary | ICD-10-CM

## 2020-02-25 MED ORDER — ALPRAZOLAM 0.5 MG PO TABS: ORAL_TABLET | ORAL | 2 refills | Status: DC

## 2020-02-25 NOTE — Telephone Encounter (Signed)
Received refill request from pharmacy for alprazolam.  Prescription sent.

## 2020-03-09 ENCOUNTER — Other Ambulatory Visit: Payer: Self-pay

## 2020-03-09 ENCOUNTER — Ambulatory Visit (INDEPENDENT_AMBULATORY_CARE_PROVIDER_SITE_OTHER): Payer: BC Managed Care – PPO | Admitting: Psychiatry

## 2020-03-09 ENCOUNTER — Telehealth: Payer: Self-pay | Admitting: Psychiatry

## 2020-03-09 DIAGNOSIS — F4310 Post-traumatic stress disorder, unspecified: Secondary | ICD-10-CM

## 2020-03-09 MED ORDER — DOXAZOSIN MESYLATE 4 MG PO TABS: ORAL_TABLET | ORAL | 5 refills | Status: DC
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
## 2020-03-09 NOTE — Telephone Encounter (Signed)
Pt reported to therapist during visit today that Prazosin no longer seems to be as effective for her nightmares and would prefer to go back on Doxazosin in lieu of Prazosin. Script sent for Doxazosin. L/M for pt that script had been sent for Doxazosin and to call if she has questions or if this is not what was needed.

## 2020-03-23 ENCOUNTER — Ambulatory Visit (INDEPENDENT_AMBULATORY_CARE_PROVIDER_SITE_OTHER): Payer: BC Managed Care – PPO | Admitting: Psychiatry

## 2020-03-23 ENCOUNTER — Other Ambulatory Visit: Payer: Self-pay

## 2020-03-23 DIAGNOSIS — F4310 Post-traumatic stress disorder, unspecified: Secondary | ICD-10-CM
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
## 2020-03-28 ENCOUNTER — Other Ambulatory Visit: Payer: Self-pay

## 2020-03-28 DIAGNOSIS — F4310 Post-traumatic stress disorder, unspecified: Secondary | ICD-10-CM

## 2020-03-28 MED ORDER — SERTRALINE HCL 100 MG PO TABS: 150.0000 mg | ORAL_TABLET | Freq: Every day | ORAL | 0 refills | Status: DC

## 2020-04-05 ENCOUNTER — Ambulatory Visit (INDEPENDENT_AMBULATORY_CARE_PROVIDER_SITE_OTHER): Payer: BC Managed Care – PPO | Admitting: Psychiatry

## 2020-04-05 ENCOUNTER — Other Ambulatory Visit: Payer: Self-pay

## 2020-04-05 ENCOUNTER — Encounter: Payer: Self-pay | Admitting: Psychiatry

## 2020-04-05 DIAGNOSIS — F5105 Insomnia due to other mental disorder: Secondary | ICD-10-CM

## 2020-04-05 DIAGNOSIS — F99 Mental disorder, not otherwise specified: Secondary | ICD-10-CM

## 2020-04-05 DIAGNOSIS — F330 Major depressive disorder, recurrent, mild: Secondary | ICD-10-CM

## 2020-04-05 DIAGNOSIS — F4310 Post-traumatic stress disorder, unspecified: Secondary | ICD-10-CM

## 2020-04-05 MED ORDER — TRAZODONE HCL 50 MG PO TABS: ORAL_TABLET | ORAL | 2 refills | Status: DC

## 2020-04-05 MED ORDER — DOXAZOSIN MESYLATE 4 MG PO TABS: ORAL_TABLET | ORAL | 5 refills | Status: DC

## 2020-04-05 MED ORDER — BUPROPION HCL ER (XL) 300 MG PO TB24: 300.0000 mg | ORAL_TABLET | Freq: Every day | ORAL | 3 refills | Status: DC

## 2020-04-05 MED ORDER — SERTRALINE HCL 100 MG PO TABS: 150.0000 mg | ORAL_TABLET | Freq: Every day | ORAL | 2 refills | Status: DC

## 2020-04-05 NOTE — Progress Notes (Signed)
Oyindamola Key [id number] 1975-11-17 45 y.o.  Subjective:   Patient ID:  [name] is a 45 y.o. (DOB Jan 28, 1976) female.  Chief Complaint:  Chief Complaint  Patient presents with  . Sleeping Problem  . Follow-up    Anxiety, depression, sleep disturbance    HPI [name] presents to the office today for follow-up of sleep disturbance,depression, and anxiety. She reports that she has been having more nightmares compared to recently "but not as much as when I first started." She reports that falling and staying asleep has improved. She reports that she learned that she has been eating in her sleep and this has occurred for awhile. She reports that she is not eating as much during the middle of the night. Long-standing night terrors. She reports anxiety has "been under control." Denies panic s/s. Denies excessive worry. She reports that she has not felt depressed in awhile. Appetite has been ok. Motivation is fine. Energy is low. She is trying to establish a consistent sleep schedule. Concentration is ok. Denies SI.   Reports taking Xanax prn on occasion. She reports that she takes Xanax prn more on Saturdays when their schedule is inconsistent.   Oldest daughter just turned 47 yo. Also has a 28 yo and 46 yo.  Past Psychiatric Medication Trials: EffexorXR Wellbutrin- Causes GI upset in the morning. Prefers to takein the morning. Trazodone- Reports that she has taken it intermittently. Reports that 25 mg is ineffective. Sertraline Doxazosin- helpful for nightmares. Noticed some fatigue Prazosin- Not effective and caused Silenor- Reports that 3 mg was effective but cost prohibitive. Doxepin- Excessive somnolence Gabapentin- caused brain fog    Review of Systems:  Review of Systems  Gastrointestinal: Negative.   Musculoskeletal: Negative for gait problem.  Neurological: Negative for tremors and headaches.  Psychiatric/Behavioral:       Please refer to HPI  She reports  that she has some RLS  Medications: I have reviewed the patient's current medications.  Current Outpatient Medications  Medication Sig Dispense Refill  . ALPRAZolam (XANAX) 0.5 MG tablet Take 1/2-1 tab po BID prn anxiety/panic 30 tablet 2  . B Complex Vitamins (VITAMIN B COMPLEX PO) Take by mouth.    . calcium carbonate (OS-CAL - DOSED IN MG OF ELEMENTAL CALCIUM) 1250 (500 Ca) MG tablet Take 1 tablet by mouth.    . cholecalciferol (VITAMIN D3) 25 MCG (1000 UT) tablet Take 1,000 Units by mouth daily.    Marland Kitchen MAGNESIUM PO Take by mouth.    . Multiple Vitamins-Minerals (MULTIVITAMIN PO) Take by mouth daily.    Marland Kitchen omeprazole (PRILOSEC OTC) 20 MG tablet Take 20 mg by mouth daily.    . Probiotic Product (PROBIOTIC PO) Take by mouth.    Marland Kitchen buPROPion (WELLBUTRIN XL) 300 MG 24 hr tablet Take 1 tablet (300 mg total) by mouth daily. 90 tablet 3  . doxazosin (CARDURA) 4 MG tablet Take 1/2-1 tab po QHS 30 tablet 5  . HYDROmorphone (DILAUDID) 2 MG tablet Take 1 tablet (2 mg total) by mouth every 4 (four) hours as needed for severe pain. (Patient not taking: Reported on 08/17/2019) 30 tablet 0  . Omega-3 Fatty Acids (FISH OIL PO) Take by mouth. (Patient not taking: Reported on 08/17/2019)    . sertraline (ZOLOFT) 100 MG tablet Take 1.5 tablets (150 mg total) by mouth daily. 135 tablet 2  . traZODone (DESYREL) 50 MG tablet Take 1/2-1 tab po QHS prn insomnia 90 tablet 2   No current facility-administered medications for this visit.  Medication Side Effects: Other: Grogginess  Allergies:  Allergies  Allergen Reactions  . Meloxicam Nausea And Vomiting    Past Medical History:  Diagnosis Date  . Anxiety   . Back pain   . Chronic hip pain   . Depression   . GERD (gastroesophageal reflux disease)     Family History  Problem Relation Age of Onset  . Hyperlipidemia Mother   . Depression Mother   . Anxiety disorder Mother   . Neuropathy Mother   . Fibromyalgia Mother   . Heart disease Father   .  Depression Father   . Depression Maternal Uncle   . Bipolar disorder Maternal Grandmother     Social History   Socioeconomic History  . Marital status: Married    Spouse name: Not on file  . Number of children: 3  . Years of education: 2 years of college  . Highest education level: Not on file  Occupational History  . Occupation: Homemaker  Tobacco Use  . Smoking status: Never Smoker  . Smokeless tobacco: Never Used  Vaping Use  . Vaping Use: Never used  Substance and Sexual Activity  . Alcohol use: Yes    Comment: Occasionally  . Drug use: No  . Sexual activity: Not on file  Other Topics Concern  . Not on file  Social History Narrative   Lives at home with her husband and children.   Right-handed.   1 cup caffeine day.   Social Determinants of Health   Financial Resource Strain: Not on file  Food Insecurity: Not on file  Transportation Needs: Not on file  Physical Activity: Not on file  Stress: Not on file  Social Connections: Not on file  Intimate Partner Violence: Not on file    Past Medical History, Surgical history, Social history, and Family history were reviewed and updated as appropriate.   Please see review of systems for further details on the patient's review from today.   Objective:   Physical Exam:  There were no vitals taken for this visit.  Physical Exam Constitutional:      General: She is not in acute distress. Musculoskeletal:        General: No deformity.  Neurological:     Mental Status: She is alert and oriented to person, place, and time.     Coordination: Coordination normal.  Psychiatric:        Attention and Perception: Attention and perception normal. She does not perceive auditory or visual hallucinations.        Mood and Affect: Mood normal. Mood is not anxious or depressed. Affect is not labile, blunt, angry or inappropriate.        Speech: Speech normal.        Behavior: Behavior normal.        Thought Content: Thought  content normal. Thought content is not paranoid or delusional. Thought content does not include homicidal or suicidal ideation. Thought content does not include homicidal or suicidal plan.        Cognition and Memory: Cognition and memory normal.        Judgment: Judgment normal.     Comments: Insight intact     Lab Review:  No results found for: NA, K, CL, CO2, GLUCOSE, BUN, CREATININE, CALCIUM, PROT, ALBUMIN, AST, ALT, ALKPHOS, BILITOT, GFRNONAA, GFRAA     Component Value Date/Time   WBC 15.8 (H) 04/08/2007 0338   RBC 4.21 04/08/2007 0338   HGB 13.4 04/08/2007 0338   HCT 39.5 04/08/2007  0338   PLT 291 04/08/2007 0338   MCV 93.9 04/08/2007 0338   MCHC 33.8 04/08/2007 0338   RDW 16.5 (H) 04/08/2007 0338    No results found for: POCLITH, LITHIUM   No results found for: PHENYTOIN, PHENOBARB, VALPROATE, CBMZ   .res Assessment: Plan:   Pt seen for 30 minutes and time spent counseling pt regarding parasomnias and sleep-related eating disorder. Discussed strategies to try to ensure safety and prevent sleep related eating, ie. Using locks, alarms, moving some foods outside of the kitchen. Case staffed with therapist to relay information about nocturnal behaviors to possibly explore in therapy, ie. If she is using food to self-soothe after nightmares. Discussed adjusting admin time of Wellbutrin XL300 mg to morning since it is activating and taking it prior to bedtime may be contributing to sleep disturbance.  Will also re-start Doxazosin since pt reports that she has not yet started Doxazosin.  Continue Sertraline 150 mg po qd for anxiety and depression. Continue Alprazolam 0.5 mg 1/2-1 tab po BID prn panic. Continue Trazodone 50 mg 1/2-1 tab po QHS prn insomnia.  Continue therapy with Lina Sayre, [facility]. Pt to follow-up with this provider in 9 months or sooner if clinically indicated.  Patient advised to contact office with any questions, adverse effects, or acute worsening in signs  and symptoms.  [name] was seen today for sleeping problem and follow-up.  Diagnoses and all orders for this visit:  PTSD (post-traumatic stress disorder) -     doxazosin (CARDURA) 4 MG tablet; Take 1/2-1 tab po QHS -     sertraline (ZOLOFT) 100 MG tablet; Take 1.5 tablets (150 mg total) by mouth daily.  Mild episode of recurrent major depressive disorder (HCC) -     buPROPion (WELLBUTRIN XL) 300 MG 24 hr tablet; Take 1 tablet (300 mg total) by mouth daily.  Insomnia due to other mental disorder -     traZODone (DESYREL) 50 MG tablet; Take 1/2-1 tab po QHS prn insomnia     Please see After Visit Summary for patient specific instructions.  Future Appointments  Date Time Provider [place]  04/20/2020  9:00 AM Lina Sayre, [facility] CP-CP None  05/04/2020 10:00 AM Lina Sayre, [facility] CP-CP None  05/19/2020 10:00 AM Lina Sayre, [facility] CP-CP None  01/05/2021 10:00 AM Thayer Headings, [facility] CP-CP None    No orders of the defined types were placed in this encounter.   -------------------------------

## 2020-04-06 ENCOUNTER — Ambulatory Visit: Payer: BC Managed Care – PPO | Admitting: Psychiatry

## 2020-04-20 ENCOUNTER — Ambulatory Visit (INDEPENDENT_AMBULATORY_CARE_PROVIDER_SITE_OTHER): Payer: BC Managed Care – PPO | Admitting: Psychiatry

## 2020-04-20 ENCOUNTER — Other Ambulatory Visit: Payer: Self-pay

## 2020-04-20 DIAGNOSIS — F4310 Post-traumatic stress disorder, unspecified: Secondary | ICD-10-CM | POA: Diagnosis not present
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
## 2020-05-04 ENCOUNTER — Ambulatory Visit (INDEPENDENT_AMBULATORY_CARE_PROVIDER_SITE_OTHER): Payer: BC Managed Care – PPO | Admitting: Psychiatry

## 2020-05-04 ENCOUNTER — Other Ambulatory Visit: Payer: Self-pay

## 2020-05-04 DIAGNOSIS — F4310 Post-traumatic stress disorder, unspecified: Secondary | ICD-10-CM

## 2020-05-04 NOTE — Progress Notes (Signed)
      [facility] Counselor/Therapist Progress Note  Patient ID: [name], MRN: [id number],    Date: 05/04/2020  Time Spent: 50 minutes start time 10:10 AM end time 11 AM  Treatment Type: Individual Therapy  Reported Symptoms: anxiety, triggered responses, nightmares  Mental Status Exam:  Appearance:   Well Groomed     Behavior:  Appropriate  Motor:  Normal  Speech/Language:   Normal Rate  Affect:  Appropriate  Mood:  anxious  Thought process:  normal  Thought content:    WNL  Sensory/Perceptual disturbances:    WNL  Orientation:  oriented to person, place, time/date and situation  Attention:  Good  Concentration:  Good  Memory:  WNL  Fund of knowledge:   Good  Insight:    Good  Judgment:   Good  Impulse Control:  Good   Risk Assessment: Danger to Self:  No Self-injurious Behavior: No Danger to Others: No Duty to Warn:no Physical Aggression / Violence:No  Access to Firearms a concern: No  Gang Involvement:No   Subjective: Patient was present for session. 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  She is hopeful that she and her husband will be able to get away for a night over the weekend but she is not sure what will work out for her.  As patient discussed the situation with going away with her husband she went on to share that he has been at about place and she is concerned about his mood.  She shared that they are not able to have physical intimacy currently.  She shared that it is no longer because of her trauma and getting triggered it is more because of the pain she feels when she actually has intercourse.  She shared she knows some of it is she is anticipating the pain and that is tensing everything up to make it worse.  Discussed different ways that she can talk her self through that and different things to do to help relax her.  Patient stated that her gynecologist is not going to return from leave until July and she has an appointment  then to see her.  Also encouraged her to communicate with her husband about other ways that they can have some physical intimacy without having intercourse.  Patient also expressed concerns about her daughter.  Recommended a book for her to help with the situation.  Interventions: Cognitive Behavioral Therapy and Solution-Oriented/Positive Psychology  Diagnosis:   ICD-10-CM   1. PTSD (post-traumatic stress disorder)  F43.10     Plan: Patient is to use CBT and coping skills to decrease triggered responses.  Patient is to follow plans from session to try and find ways to have physical intimacy with her husband.  Patient is to take medication as directed.  Patient is to continue working with her [place] and horses to relax her. Long-term goal: Develop and implement effective coping skills that allow for carrying out of normal responsibilities and participating relationships and social activities Short-term goal: Practice implement relaxation training as a coping mechanism for tension panic stress anger and anxiety  Lina Sayre, [facility]

## 2020-05-19 ENCOUNTER — Ambulatory Visit: Payer: BC Managed Care – PPO | Admitting: Psychiatry

## 2020-06-02 ENCOUNTER — Ambulatory Visit (INDEPENDENT_AMBULATORY_CARE_PROVIDER_SITE_OTHER): Payer: BC Managed Care – PPO | Admitting: Psychiatry

## 2020-06-02 ENCOUNTER — Other Ambulatory Visit: Payer: Self-pay

## 2020-06-02 DIAGNOSIS — F4310 Post-traumatic stress disorder, unspecified: Secondary | ICD-10-CM | POA: Diagnosis not present
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
## 2020-06-15 ENCOUNTER — Other Ambulatory Visit: Payer: Self-pay

## 2020-06-15 ENCOUNTER — Ambulatory Visit (INDEPENDENT_AMBULATORY_CARE_PROVIDER_SITE_OTHER): Payer: BC Managed Care – PPO | Admitting: Psychiatry

## 2020-06-15 DIAGNOSIS — F4310 Post-traumatic stress disorder, unspecified: Secondary | ICD-10-CM

## 2020-06-15 NOTE — Progress Notes (Signed)
      [facility] Counselor/Therapist Progress Note  Patient ID: [name], MRN: [id number],    Date: 06/15/2020  Time Spent: 50 minutes start time 10:11 AM end time 11:01 AM  Treatment Type: Individual Therapy  Reported Symptoms: anxiety,flashbacks, nightmares, irritability  Mental Status Exam:  Appearance:   Casual and Neat     Behavior:  Appropriate  Motor:  Normal  Speech/Language:   Normal Rate  Affect:  Appropriate  Mood:  normal  Thought process:  normal  Thought content:    WNL  Sensory/Perceptual disturbances:    WNL  Orientation:  oriented to person, place, time/date and situation  Attention:  Good  Concentration:  Good  Memory:  WNL  Fund of knowledge:   Good  Insight:    Good  Judgment:   Good  Impulse Control:  Good   Risk Assessment: Danger to Self:  No Self-injurious Behavior: No Danger to Others: No Duty to Warn:no Physical Aggression / Violence:No  Access to Firearms a concern: No  Gang Involvement:No   Subjective: Patient was present for session.  She shared that things are going okay overall. She reported she is doing better managing her flashbacks and nightmares.  She seems to be decreasing in nighttime eating.  Patient reported she is doing much better with the physical intimacy with her husband and even though she is still having pain and is hopeful that things can be improved when she talks to her gynecologist.  She shared she is not having all the flashbacks and intrusive thoughts during physical intimacy which is positive.  Patient shared that she and her husband are having a few bumps recently and she feels that is because he has had more contact with his mother than usual.  Discussed different ways that she can handle it and communicate with him appropriately even if it is difficult.  Patient was able to identify things that she has been working on and would like for him to be able to acknowledge those positive things.  Discussed the importance  of sharing that with him because he may not even be aware that his affirmation is important to her.  Patient shared she is doing better with her son's dragon and going to see her [place] is still extremely therapeutic for her.  The importance of her continuing to take care of herself and releasing negative emotions appropriately were discussed with patient.  Interventions: Solution-Oriented/Positive Psychology  Diagnosis:   ICD-10-CM   1. PTSD (post-traumatic stress disorder)  F43.10     Plan: Patient is to use CBT and coping skills to decrease triggered responses.  Patient is to talk to her husband about what she is needing and see if they can work on their communication.  Patient is to continue to spend time with her [place] and the horses to release negative emotions appropriately.  Patient is to continue having regular phone calls with her grandmother since she is not sure how long she will be around. Long term goal: Develop and implement effective coping skills that allow for carrying out normal responsibilities and participating relationships and social activities. Short term goal: Practice, implement relaxation training as a coping Mechanism for tension, panic, stress, anger and anxiety  Lina Sayre, [facility]

## 2020-06-29 DIAGNOSIS — K760 Fatty (change of) liver, not elsewhere classified: Secondary | ICD-10-CM

## 2020-06-29 HISTORY — DX: Fatty (change of) liver, not elsewhere classified: K76.0

## 2020-07-01 ENCOUNTER — Other Ambulatory Visit: Payer: Self-pay

## 2020-07-01 DIAGNOSIS — F4310 Post-traumatic stress disorder, unspecified: Secondary | ICD-10-CM

## 2020-07-01 MED ORDER — SERTRALINE HCL 100 MG PO TABS: 150.0000 mg | ORAL_TABLET | Freq: Every day | ORAL | 1 refills | Status: DC

## 2020-07-19 ENCOUNTER — Other Ambulatory Visit: Payer: Self-pay

## 2020-07-19 ENCOUNTER — Ambulatory Visit: Payer: BC Managed Care – PPO | Admitting: Psychiatry

## 2020-07-19 DIAGNOSIS — F4310 Post-traumatic stress disorder, unspecified: Secondary | ICD-10-CM
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
## 2020-08-02 ENCOUNTER — Ambulatory Visit: Payer: BC Managed Care – PPO | Admitting: Psychiatry

## 2020-08-16 ENCOUNTER — Ambulatory Visit: Payer: BC Managed Care – PPO | Admitting: Psychiatry

## 2020-08-16 ENCOUNTER — Other Ambulatory Visit: Payer: Self-pay

## 2020-08-16 ENCOUNTER — Encounter: Payer: Self-pay | Admitting: Psychiatry

## 2020-08-16 DIAGNOSIS — F4310 Post-traumatic stress disorder, unspecified: Secondary | ICD-10-CM | POA: Diagnosis not present

## 2020-08-16 NOTE — Progress Notes (Signed)
[facility] Counselor/Therapist Progress Note  Patient ID: [name], MRN: [id number],    Date: 08/16/2020  Time Spent: 52 minutes start time 11:03 a.m. end time 11:55 AM  Treatment Type: Individual Therapy  Reported Symptoms: anxiety, sadness, triggered responses, nightmares  Mental Status Exam:  Appearance:   Casual and Neat     Behavior:  Appropriate  Motor:  Normal  Speech/Language:   Normal Rate  Affect:  Appropriate  Mood:  anxious  Thought process:  normal  Thought content:    WNL  Sensory/Perceptual disturbances:    WNL  Orientation:  oriented to person, place, time/date, and situation  Attention:  Good  Concentration:  Good  Memory:  WNL  Fund of knowledge:   Good  Insight:    Good  Judgment:   Good  Impulse Control:  Good   Risk Assessment: Danger to Self:  No Self-injurious Behavior: No Danger to Others: No Duty to Warn:no Physical Aggression / Violence:No  Access to Firearms a concern: No  Gang Involvement:No   Subjective: Patient was present for session.  She shared she did see her OBGYN and she gave her some options for helping with sex.  Patient reported she is not sure about the options.  She explained that her doctor explained that the situation has to do with difficulty with lubrication which could be medication related.  She went on to share that currently she has a bladder infection.  Patient shared she went for a kidney stone and she ended up finding out that she has a fatty liver and plaque in her arteries.she is not sure what these things mean currently but will be working with her PCP to find out.  Her Dad's cancer is back and patient explained she feels this need to go back and see him.  She is also wanting her kids to go.  She shared he has had some different behaviors recently that were positive and actually seemed to be different than his norm.  Patient is also still concerned about her grandmother and has been having nightmares about her  dying.  Discussed the importance of her doing things while she can and if she has that desire and need to go in see her father and grandmother she needs to do that while she can rather than having regrets for not doing it later.  Patient was able to agree that that would be the best option and that she would communicate that with her husband and start working on plans to do so.  Patient also shared that she is continuing to enjoy the time she is spending with the meal and the horses.  She went on a trip with her daughter and the group at the horse stables which was a really good thing for her.  Patient stated she was able to allow her daughter some independence and deal with the anxiety which was positive growth.  Ways for her to continue talking herself through the letting go process and recognizing that she has done a good job as a parent were discussed with patient and plans were developed in session.  Interventions: Cognitive Behavioral Therapy, Solution-Oriented/Positive Psychology, and Insight-Oriented  Diagnosis:   ICD-10-CM   1. PTSD (post-traumatic stress disorder)  F43.10       Plan: Patient is to use CBT and coping skills to continue decreasing triggered responses.  Patient is to make a plan to go and see her father and grandmother.  Patient is to continue  spending time with her [place] and the horses to release negative emotions appropriately.  Patient is to take medication as directed. Long term goal: Develop and implement effective coping skills that allow for carrying out normal responsibilities and participating relationships and social activities. Short term goal: Practice, implement relaxation training as a coping  Mechanism for tension, panic, stress, anger and anxiety  Lina Sayre, [facility]

## 2020-09-06 ENCOUNTER — Other Ambulatory Visit: Payer: Self-pay

## 2020-09-06 DIAGNOSIS — F5105 Insomnia due to other mental disorder: Secondary | ICD-10-CM

## 2020-09-06 DIAGNOSIS — F99 Mental disorder, not otherwise specified: Secondary | ICD-10-CM

## 2020-09-06 MED ORDER — TRAZODONE HCL 50 MG PO TABS: ORAL_TABLET | ORAL | 2 refills | Status: DC

## 2020-09-20 ENCOUNTER — Other Ambulatory Visit: Payer: Self-pay

## 2020-09-20 DIAGNOSIS — F4310 Post-traumatic stress disorder, unspecified: Secondary | ICD-10-CM

## 2020-09-21 ENCOUNTER — Ambulatory Visit: Payer: BC Managed Care – PPO | Admitting: Psychiatry

## 2020-09-21 ENCOUNTER — Other Ambulatory Visit: Payer: Self-pay

## 2020-09-21 DIAGNOSIS — F4310 Post-traumatic stress disorder, unspecified: Secondary | ICD-10-CM | POA: Diagnosis not present

## 2020-09-21 MED ORDER — ALPRAZOLAM 0.5 MG PO TABS: ORAL_TABLET | ORAL | 2 refills | Status: DC
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
## 2020-09-22 ENCOUNTER — Telehealth: Payer: Self-pay | Admitting: Psychiatry

## 2020-09-22 NOTE — Telephone Encounter (Signed)
Attempted to reach pt. L/M that Xanax request was approved yesterday and sent to [facility] on [address] and was not sure if this was the medication she had been having issues with. Advised her to contact office if she is continuing to have difficulty filling any of her medications.

## 2020-09-22 NOTE — Telephone Encounter (Signed)
Patient shared that she need 1 of her meds and there is confusion about the prescription.

## 2020-10-25 ENCOUNTER — Other Ambulatory Visit: Payer: Self-pay

## 2020-10-25 ENCOUNTER — Ambulatory Visit: Payer: BC Managed Care – PPO | Admitting: Psychiatry

## 2020-10-25 DIAGNOSIS — F4310 Post-traumatic stress disorder, unspecified: Secondary | ICD-10-CM
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
## 2020-11-10 ENCOUNTER — Encounter: Payer: Self-pay | Admitting: Gastroenterology

## 2020-11-10 ENCOUNTER — Encounter: Payer: Self-pay | Admitting: General Surgery

## 2020-11-16 ENCOUNTER — Other Ambulatory Visit: Payer: Self-pay

## 2020-11-16 ENCOUNTER — Encounter: Payer: Self-pay | Admitting: Gastroenterology

## 2020-11-16 ENCOUNTER — Ambulatory Visit (INDEPENDENT_AMBULATORY_CARE_PROVIDER_SITE_OTHER): Payer: BC Managed Care – PPO | Admitting: Gastroenterology

## 2020-11-16 VITALS — BP 128/76 | HR 85 | Ht 62.5 in | Wt 170.0 lb

## 2020-11-16 DIAGNOSIS — K219 Gastro-esophageal reflux disease without esophagitis: Secondary | ICD-10-CM | POA: Diagnosis not present

## 2020-11-16 DIAGNOSIS — R109 Unspecified abdominal pain: Secondary | ICD-10-CM

## 2020-11-16 DIAGNOSIS — R1012 Left upper quadrant pain: Secondary | ICD-10-CM

## 2020-11-16 DIAGNOSIS — K760 Fatty (change of) liver, not elsewhere classified: Secondary | ICD-10-CM

## 2020-11-16 DIAGNOSIS — Z1211 Encounter for screening for malignant neoplasm of colon: Secondary | ICD-10-CM

## 2020-11-16 NOTE — Progress Notes (Signed)
Chief Complaint: Abdominal pain, hepatic steatosis   Referring Provider: Katherina Mires, MD   HPI:     [name] is a 45 y.o. female with a history of anxiety, depression, referred to the Gastroenterology Clinic for evaluation of abdominal pain.  79-monthhistory of LUQ abdominal pain.  Pain started in February, but worsening over the last 3 months or so.  No preceding changes in diet, medications, activities, etc.  Has been seen by Urology due to left-sided nephrolithiasis, but Urologist feels this is not the etiology for her pain. Describes as pinpoint pain under left costal margin.  Worse with prolonged walking.  Not associated with p.o. intake.  No associated changes in bowel habits, hematochezia, melena, fever, n/v.  She started taking probiotic without much change.  Evaluation to date notable for the following: - 07/25/2020: CT abdomen/pelvis: 4 mm left-sided kidney stone.  Hepatic steatosis, aortic atherosclerosis, small splenic granuloma - 08/2020: Normal CBC, CMP (CO2 17), B12, TSH - 10/2020: UA with large blood, protein 30   Has a prior hx of reflux, with index sxs of HB and regurgitation. No dysphagia. Was started on Prilosec 40 mg/day a few years ago with improvement. Will now take Prilosec x2 weeks a few times/year when sxs recur.   No previous EGD or colonoscopy.  No known family history of CRC, GI malignancy, liver disease, pancreatic disease, or IBD.     Past Medical History:  Diagnosis Date   Anxiety    Back pain    Chronic hip pain    Depression    GERD (gastroesophageal reflux disease)      Past Surgical History:  Procedure Laterality Date   DILATION AND CURETTAGE OF UTERUS     FOOT SURGERY     MOUTH SURGERY     Family History  Problem Relation Age of Onset   Hyperlipidemia Mother    Depression Mother    Anxiety disorder Mother    Neuropathy Mother    Fibromyalgia Mother    Heart disease Father    Depression Father     Prostate cancer Father    Stomach cancer Father    Bipolar disorder Maternal Grandmother    Depression Maternal Uncle    Colon cancer Neg Hx    Esophageal cancer Neg Hx    Rectal cancer Neg Hx    Social History   Tobacco Use   Smoking status: Never   Smokeless tobacco: Never  Vaping Use   Vaping Use: Never used  Substance Use Topics   Alcohol use: Yes    Comment: Occasionally   Drug use: No   Current Outpatient Medications  Medication Sig Dispense Refill   ALPRAZolam (XANAX) 0.5 MG tablet Take 1/2-1 tab po BID prn anxiety/panic 30 tablet 2   APPLE CIDER VINEGAR PO Take by mouth.     B Complex Vitamins (VITAMIN B COMPLEX PO) Take by mouth.     buPROPion (WELLBUTRIN XL) 300 MG 24 hr tablet Take 1 tablet (300 mg total) by mouth daily. 90 tablet 3   calcium carbonate (OS-CAL - DOSED IN MG OF ELEMENTAL CALCIUM) 1250 (500 Ca) MG tablet Take 1 tablet by mouth.     cholecalciferol (VITAMIN D3) 25 MCG (1000 UT) tablet Take 1,000 Units by mouth daily.     doxazosin (CARDURA) 4 MG tablet Take 1/2-1 tab po QHS 30 tablet 5   HYDROmorphone (DILAUDID) 2 MG tablet Take 1 tablet (2  mg total) by mouth every 4 (four) hours as needed for severe pain. (Patient not taking: No sig reported) 30 tablet 0   MAGNESIUM PO Take by mouth.     Multiple Vitamins-Minerals (MULTIVITAMIN PO) Take by mouth daily.     Omega-3 Fatty Acids (FISH OIL PO) Take by mouth. (Patient not taking: No sig reported)     omeprazole (PRILOSEC OTC) 20 MG tablet Take 20 mg by mouth daily.     Probiotic Product (PROBIOTIC PO) Take by mouth.     sertraline (ZOLOFT) 100 MG tablet Take 1.5 tablets (150 mg total) by mouth daily. 135 tablet 1   traZODone (DESYREL) 50 MG tablet Take 1/2-1 tab po QHS prn insomnia 90 tablet 2   No current facility-administered medications for this visit.   Allergies  Allergen Reactions   Meloxicam Nausea And Vomiting   Penicillins Nausea And Vomiting     Review of Systems: All systems reviewed and  negative except where noted in HPI.     Physical Exam:    Wt Readings from Last 3 Encounters:  11/16/20 170 lb (77.1 kg)  05/29/19 155 lb (70.3 kg)  01/07/19 159 lb (72.1 kg)    BP 128/76   Pulse 85   Ht 5' 2.5" (1.588 m)   Wt 170 lb (77.1 kg)   SpO2 98%   BMI 30.60 kg/m  Constitutional:  Pleasant, in no acute distress. Psychiatric: Normal mood and affect. Behavior is normal. Cardiovascular: Normal rate, regular rhythm. No edema Pulmonary/chest: Effort normal and breath sounds normal. No wheezing, rales or rhonchi. Abdominal: Pinpoint tenderness in LUQ.  Positive Carnett's sign.  Soft, nondistended.  No other areas of tenderness.  Bowel sounds active throughout. There are no masses palpable. No hepatomegaly. Neurological: Alert and oriented to person place and time. Skin: Skin is warm and dry. No rashes noted.   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  Discussed pathophysiology today. - Will schedule appointment with me for abdominal wall injection - Do not feel she needs endoscopic evaluation at this juncture  3) Colon cancer screening - Due for age-appropriate, average risk CRC screening - Patient has Cologuard kit at home - Discussed pros/cons of colonoscopy, Cologuard.  She would like to think about this and discuss on follow-up  4) Hepatic steatosis - Incidentally noted hepatic steatosis on recent CT.  Otherwise normal liver enzymes. - Recommend healthy diet with regular exercise - Offered RUQ [place] for further evaluation.  Owing to normal liver enzymes, deferred for now - Repeat liver enzymes in 6-12 months.  If elevated, plan for further work-up  5) Splenic Granuloma -Will discuss referral to Hematology clinic at follow-up appointment  6) GERD - Rare episodes of reflux symptoms, mainly from dietary indiscretions.  Takes PPI infrequently. - Continue  dietary/lifestyle modification with avoidance of exacerbating foods/activities - Okay to resume on demand PPI only.  Given infrequency, do not feel that daily medication nor endoscopic exam needed at this time, and she agrees   Lavena Bullion, DO, [facility]  11/16/2020, 1:48 PM   Darden Amber, [place] Katherina Mires, MD

## 2020-11-16 NOTE — Patient Instructions (Signed)
If you are age 46 or older, your body mass index should be between 23-30. Your Body mass index is 30.6 kg/m. If this is out of the aforementioned range listed, please consider follow up with your Primary Care Provider.  If you are age 67 or younger, your body mass index should be between 19-25. Your Body mass index is 30.6 kg/m. If this is out of the aformentioned range listed, please consider follow up with your Primary Care Provider.   __________________________________________________________  The [place] [facility] providers would like to encourage you to use [facility] to communicate with providers for non-urgent requests or questions.  Due to long hold times on the telephone, sending your provider a message by [facility] may be a faster and more efficient way to get a response.  Please allow 48 business hours for a response.  Please remember that this is for non-urgent requests.   Due to recent changes in healthcare laws, you may see the results of your imaging and laboratory studies on MyChart before your provider has had a chance to review them.  We understand that in some cases there may be results that are confusing or concerning to you. Not all laboratory results come back in the same time frame and the provider may be waiting for multiple results in order to interpret others.  Please give [place] 48 hours in order for your provider to thoroughly review all the results before contacting the office for clarification of your results.   Thank you for choosing me and [facility].  Vito Cirigliano, D.O.

## 2020-11-24 ENCOUNTER — Ambulatory Visit: Payer: BC Managed Care – PPO | Admitting: Psychiatry

## 2020-12-01 ENCOUNTER — Other Ambulatory Visit: Payer: Self-pay

## 2020-12-01 ENCOUNTER — Telehealth: Payer: Self-pay | Admitting: General Surgery

## 2020-12-01 ENCOUNTER — Ambulatory Visit (INDEPENDENT_AMBULATORY_CARE_PROVIDER_SITE_OTHER): Payer: BC Managed Care – PPO | Admitting: Gastroenterology

## 2020-12-01 ENCOUNTER — Encounter: Payer: Self-pay | Admitting: Gastroenterology

## 2020-12-01 VITALS — BP 124/78 | HR 73 | Ht 62.5 in | Wt 167.0 lb

## 2020-12-01 DIAGNOSIS — K219 Gastro-esophageal reflux disease without esophagitis: Secondary | ICD-10-CM

## 2020-12-01 DIAGNOSIS — G589 Mononeuropathy, unspecified: Secondary | ICD-10-CM

## 2020-12-01 DIAGNOSIS — K760 Fatty (change of) liver, not elsewhere classified: Secondary | ICD-10-CM

## 2020-12-01 DIAGNOSIS — Z1211 Encounter for screening for malignant neoplasm of colon: Secondary | ICD-10-CM

## 2020-12-01 DIAGNOSIS — R1012 Left upper quadrant pain: Secondary | ICD-10-CM | POA: Diagnosis not present

## 2020-12-01 DIAGNOSIS — D739 Disease of spleen, unspecified: Secondary | ICD-10-CM

## 2020-12-01 DIAGNOSIS — R109 Unspecified abdominal pain: Secondary | ICD-10-CM | POA: Diagnosis not present

## 2020-12-01 NOTE — Progress Notes (Signed)
Chief Complaint:    LUQ abdominal pain, Abdominal Wall Syndrome  HPI:     Patient is a 45 y.o. femalewith a history of chronic pain in the LUQ for the last 8+ months.  Pain has been worsening over the last 3 months or so.  Not associated with p.o. intake.  Pinpoint pain under left costal margin, worse with prolonged walking.  No associated change in bowel habits, hematochezia, melena, nausea, vomiting, fever.  She was evaluated in the GI clinic on 11/16/2020 and exam with positive Carnett's sign in LUQ.  Pain has otherwise been unresponsive to conservative management, and she presents for consideration of abdominal wall injection for Anterior Cutaneous Nerve Entrapment Syndrome/Abdominal Wall Syndrome.  Otherwise, no change in medical or surgical history, medications, allergies, social history since last appointment with me.  Separately, hx of reflux, with index sxs of HB and regurgitation. No dysphagia. Was started on Prilosec 40 mg/day a few years ago with improvement. Will now take Prilosec x2 weeks a few times/year when sxs recur.  No previous EGD or colonoscopy.   Review of systems:     No chest pain, no SOB, no fevers, no urinary sx   Past Medical History:  Diagnosis Date   Anxiety    Back pain    Chronic hip pain    Depression    GERD (gastroesophageal reflux disease)     Patient's surgical history, family medical history, social history, medications and allergies were all reviewed in Epic    Current Outpatient Medications  Medication Sig Dispense Refill   ALPRAZolam (XANAX) 0.5 MG tablet Take 1/2-1 tab po BID prn anxiety/panic 30 tablet 2   APPLE CIDER VINEGAR PO Take by mouth.     B Complex Vitamins (VITAMIN B COMPLEX PO) Take by mouth.     buPROPion (WELLBUTRIN XL) 300 MG 24 hr tablet Take 1 tablet (300 mg total) by mouth daily. 90 tablet 3   calcium carbonate (OS-CAL - DOSED IN MG OF ELEMENTAL CALCIUM) 1250 (500 Ca) MG tablet Take 1 tablet by mouth.      cholecalciferol (VITAMIN D3) 25 MCG (1000 UT) tablet Take 1,000 Units by mouth daily.     doxazosin (CARDURA) 4 MG tablet Take 1/2-1 tab po QHS 30 tablet 5   HYDROmorphone (DILAUDID) 2 MG tablet Take 1 tablet (2 mg total) by mouth every 4 (four) hours as needed for severe pain. 30 tablet 0   MAGNESIUM PO Take by mouth.     Multiple Vitamins-Minerals (MULTIVITAMIN PO) Take by mouth daily.     Omega-3 Fatty Acids (FISH OIL PO) Take by mouth.     omeprazole (PRILOSEC OTC) 20 MG tablet Take 20 mg by mouth daily.     Probiotic Product (PROBIOTIC PO) Take by mouth.     traZODone (DESYREL) 50 MG tablet Take 1/2-1 tab po QHS prn insomnia 90 tablet 2   sertraline (ZOLOFT) 100 MG tablet Take 1.5 tablets (150 mg total) by mouth daily. 135 tablet 1   No current facility-administered medications for this visit.    Physical Exam:     BP 124/78   Pulse 73   Ht 5' 2.5" (1.588 m)   Wt 167 lb (75.8 kg)   SpO2 97%   BMI 30.06 kg/m   GENERAL:  Pleasant female in NAD PSYCH: : Cooperative, normal affect ABDOMEN:  +Carnett's sign with pinpoint TTP in 2 separate sites in the LUQ.  Otherwise, nondistended, soft.  No peritoneal signs.  No obvious masses,  no hepatomegaly,  normal bowel sounds SKIN:  turgor, no lesions seen Musculoskeletal:  Normal muscle tone, normal strength NEURO: Alert and oriented x 3, no focal neurologic deficits    IMPRESSION and PLAN:    1)  Anterior Cutaneous Nerve Entrapment Syndrome/Abdominal Wall Syndrome  PROCEDURE NOTE: The patient presents with symptomatic Abdominal Wall Syndrome, unresponsive to conservative management, requesting abdominal wall injection for diagnostic and therapeutic intent.  All risks, benefits and alternative forms of therapy were described and informed consent was obtained.  -The patient was draped and the site prepped in the usual sterile fashion.  Site of pain was reconfirmed by manual palpation. -Lidocaine 1%.  Injected 5 cc into the site of  pain, with resolution of pain upon repeat palpation.  This confirmed the diagnosis of Abdominal Wall Syndrome. -Kenalog 40 mg.  Injected directly into the site of pain for therapeutic intervention of the above confirmed Abdominal Wall Syndrome. -Band-Aid applied - This procedure was repeated in the second site of pain, also located in the LUQ.  Again resolution of pain after injection of lidocaine and subsequent injection of Kenalog. -The patient was observed in the Gastroenterology Clinic for 15 minutes after the procedure. No complications were encountered and the patient tolerated the procedure well.  -I explained that pain may recur and approximately 20% of patients.  If the pain does recur, can follow-up with me for possible repeat injection in 4+ weeks.  Otherwise, to follow-up as needed.  2) Hepatic Steatosis: - Incidentally noted hepatic steatosis on recent CT.  Otherwise normal liver enzymes. - Recommend healthy diet with regular exercise - Previously offered RUQ [place] for further evaluation.  Owing to normal liver enzymes, deferred for now - Repeat liver enzymes in 6-12 months.  If elevated, plan for further work-up  3) Colon cancer screening -Completed Cologuard but has not yet received results.  I asked that she update [place] with result, particularly if positive   4) Splenic Granuloma -Coordinate referral to Hematology clinic   5) GERD - Rare episodes of reflux symptoms, mainly from dietary indiscretions.  Takes PPI infrequently. - Continue dietary/lifestyle modification with avoidance of exacerbating foods/activities - Okay to resume on demand PPI only.  Given infrequency, do not feel that daily medication nor endoscopic exam needed at this time, and she agrees       Lavena Bullion ,DO, [facility] 12/01/2020, 10:48 AM

## 2020-12-01 NOTE — Telephone Encounter (Signed)
-----   Message from [place], DO sent at 12/01/2020 12:29 PM EDT ----- 1 thing we were unable to address today is the incidental finding of a small splenic granuloma.  These are typically benign, but I tend to get a referral to Hematology for evaluation and ensure no other work-up is needed.  We had discussed this last time, but did not review that at today's appointment.  Can you please call her and update her that we would like a referral to Hematology?  Thank you.

## 2020-12-01 NOTE — Patient Instructions (Signed)
If you are age 46 or older, your body mass index should be between 23-30. Your Body mass index is 30.06 kg/m. If this is out of the aforementioned range listed, please consider follow up with your Primary Care Provider.  If you are age 57 or younger, your body mass index should be between 19-25. Your Body mass index is 30.06 kg/m. If this is out of the aformentioned range listed, please consider follow up with your Primary Care Provider.   Due to recent changes in healthcare laws, you may see the results of your imaging and laboratory studies on MyChart before your provider has had a chance to review them.  We understand that in some cases there may be results that are confusing or concerning to you. Not all laboratory results come back in the same time frame and the provider may be waiting for multiple results in order to interpret others.  Please give [place] 48 hours in order for your provider to thoroughly review all the results before contacting the office for clarification of your results.   If you have any questions please contact the office  Thank you for choosing me and [facility].  Vito Cirigliano, D.O.

## 2020-12-01 NOTE — Telephone Encounter (Signed)
Left a voicemail for the patient to call me back regarding a referral to hematology that was placed. It was briefly talked about at her last visit and we would like her to move forward with this consultation.   Referral placed to Dr Marin Olp

## 2020-12-05 LAB — COLOGUARD: COLOGUARD: NEGATIVE

## 2020-12-07 NOTE — Telephone Encounter (Signed)
Patient returned my call today and I explained to her that we have place a referral to Dr Marin Olp to consult for her hepatic granuloma.   Patient stated that the injection that was given for abd wall pain lasted 1 day and she would like to know what can be done at this point?

## 2020-12-07 NOTE — Telephone Encounter (Signed)
For now, heating pad over the area that hurts, okay to alternate ibuprofen and Tylenol as needed, and rest.  If pain persists, can plan for repeat abdominal wall injection in 3-4 weeks.  Need to allow time to heal before repeat injection.  Of note, referral to Dr. Marin Olp is for a splenic granuloma (not hepatic).  Thanks.

## 2020-12-07 NOTE — Telephone Encounter (Signed)
Contacted the patient and discussed using a heating pad and alternating tylenol and motrin for 3-4 weeks, the patient has decided to schedule a second abd wall injection which was scheduled for 12/13. Patient verbalized understanding

## 2020-12-12 ENCOUNTER — Telehealth: Payer: Self-pay | Admitting: General Surgery

## 2020-12-12 NOTE — Telephone Encounter (Signed)
-----   Message from Lavena Bullion, DO sent at 12/06/2020  6:37 PM EST ----- Regarding: RE: New Patient Referral Olivia Mackie, Can you please help out in obtaining a copy of the previous CT scan.  I think it was done on 07/25/2020 at [facility]  Thank you.   ----- Message ----- From: Fortunato Curling Sent: 12/06/2020  10:51 AM EST To: Lavena Bullion, DO Subject: New Patient Referral                           Good Morning,  We have received a referral from your office on the patient above. However, Dr.Ennever is needing more information on this patient. He said that he needs to see the CT that is mentioned which showed the splenic issues.  Thank you  Suanne Marker

## 2020-12-12 NOTE — Telephone Encounter (Signed)
Records Request sent to [facility] for records of Cat Scan abdomen/pelvis 07/25/2020

## 2020-12-15 ENCOUNTER — Ambulatory Visit (INDEPENDENT_AMBULATORY_CARE_PROVIDER_SITE_OTHER): Payer: BC Managed Care – PPO | Admitting: Psychiatry

## 2020-12-15 ENCOUNTER — Telehealth: Payer: Self-pay | Admitting: Psychiatry

## 2020-12-15 DIAGNOSIS — F4310 Post-traumatic stress disorder, unspecified: Secondary | ICD-10-CM | POA: Diagnosis not present

## 2020-12-15 NOTE — Progress Notes (Signed)
      [facility] Counselor/Therapist Progress Note  Patient ID: [name], MRN: [id number],    Date: 12/15/2020  Time Spent: 51 minutes Start time 11:01 Virtual Visit via Video Note Connected with patient by a telemedicine/telehealth application, with their informed consent, and verified patient privacy and that I am speaking with the correct person using two identifiers. I discussed the limitations, risks, security and privacy concerns of performing psychotherapy and the availability of in person appointments. I also discussed with the patient that there may be a patient responsible charge related to this service. The patient expressed understanding and agreed to proceed. I discussed the treatment planning with the patient. The patient was provided an opportunity to ask questions and all were answered. The patient agreed with the plan and demonstrated an understanding of the instructions. The patient was advised to call  our office if  symptoms worsen or feel they are in a crisis state and need immediate contact.   Therapist Location: home Patient Location: home    Treatment Type: Individual Therapy  Reported Symptoms: medical issues, anxiety, triggered responses,frustration  Mental Status Exam:  Appearance:   Casual and Neat     Behavior:  Appropriate  Motor:  Normal  Speech/Language:   Normal Rate  Affect:  Appropriate  Mood:  sad  Thought process:  normal  Thought content:    WNL  Sensory/Perceptual disturbances:    WNL  Orientation:  oriented to person, place, time/date, and situation  Attention:  Good  Concentration:  Good  Memory:  WNL  Fund of knowledge:   Good  Insight:    Good  Judgment:   Good  Impulse Control:  Good   Risk Assessment: Danger to Self:  No Self-injurious Behavior: No Danger to Others: No Duty to Warn:no Physical Aggression / Violence:No  Access to Firearms a concern: No  Gang Involvement:No   Subjective: Met with patient via virtual  session.  Patient reported that she has been struggling with loosing friendships in her life. Did the processing on the issue SUDS level 6, "I'm not worth investing in" felt sadness in her shoulders.  She was able to reduce SUDS level to 3.  Patient was able to recognize that she has been able to move forward from it all.  Discussed ways to take care of herself and enjoy her father when he comes for Christmas.  Interventions: Cognitive Behavioral Therapy, Solution-Oriented/Positive Psychology, and Eye Movement Desensitization and Reprocessing (EMDR)  Diagnosis:   ICD-10-CM   1. PTSD (post-traumatic stress disorder)  F43.10       Plan: Patient is to use her CBT and coping skills to decrease triggered response. Patient is to work on self care and enjoying her animals.  Patient is to take medication as directed. Long term goal: Develop and implement effective coping skills that allow for carrying out normal responsibilities and participating relationships and social activities. Short term goal: Practice, implement relaxation training as a coping  Mechanism for tension, panic, stress, anger and anxiety  Lina Sayre, [facility]

## 2020-12-15 NOTE — Telephone Encounter (Signed)
Ms. [name], silliman are scheduled for a virtual visit with your provider today.    Just as we do with appointments in the office, we must obtain your consent to participate.  Your consent will be active for this visit and any virtual visit you may have with one of our providers in the next 365 days.    If you have a MyChart account, I can also send a copy of this consent to you electronically.  All virtual visits are billed to your insurance company just like a traditional visit in the office.  As this is a virtual visit, video technology does not allow for your provider to perform a traditional examination.  This may limit your provider's ability to fully assess your condition.  If your provider identifies any concerns that need to be evaluated in person or the need to arrange testing such as labs, EKG, etc, we will make arrangements to do so.    Although advances in technology are sophisticated, we cannot ensure that it will always work on either your end or our end.  If the connection with a video visit is poor, we may have to switch to a telephone visit.  With either a video or telephone visit, we are not always able to ensure that we have a secure connection.   I need to obtain your verbal consent now.   Are you willing to proceed with your visit today?   [name] has provided verbal consent on 12/15/2020 for a virtual visit (video or telephone).   Lina Sayre, [facility] 12/15/2020  11:04 AM

## 2020-12-19 ENCOUNTER — Other Ambulatory Visit: Payer: Self-pay

## 2020-12-19 ENCOUNTER — Ambulatory Visit (INDEPENDENT_AMBULATORY_CARE_PROVIDER_SITE_OTHER): Payer: BC Managed Care – PPO | Admitting: Psychiatry

## 2020-12-19 DIAGNOSIS — F4310 Post-traumatic stress disorder, unspecified: Secondary | ICD-10-CM

## 2020-12-19 NOTE — Progress Notes (Signed)
      [facility] Counselor/Therapist Progress Note  Patient ID: [name], MRN: [id number],    Date: 12/19/2020  Time Spent: 54 minutes start time 1:13 PM end time 2:07 PM  Treatment Type: Individual Therapy  Reported Symptoms: anxiety, triggered responses, fatigue  Mental Status Exam:  Appearance:   Well Groomed     Behavior:  Appropriate  Motor:  Normal  Speech/Language:   Normal Rate  Affect:  Appropriate  Mood:  anxious  Thought process:  normal  Thought content:    WNL  Sensory/Perceptual disturbances:    WNL  Orientation:  oriented to person, place, time/date, and situation  Attention:  Good  Concentration:  Good  Memory:  WNL  Fund of knowledge:   Good  Insight:    Good  Judgment:   Good  Impulse Control:  Good   Risk Assessment: Danger to Self:  No Self-injurious Behavior: No Danger to Others: No Duty to Warn:no Physical Aggression / Violence:No  Access to Firearms a concern: No  Gang Involvement:No   Subjective: Patient was present for session. She shared that her daughter was injured and that has caused her anxiety.  She is doing better with her sleep which is good. She shared that some things had triggered from last session.  Patient processed the fact that she has no time for her, suds level 6, negative cognition "what I do is not enough" felt anxiety in her stomach.  Patient was able to reduce suds level to 3.  Encouraged patient to recognize that she has to allow her children and her husband to manage things that are theirs and not hers on their own.  Discussed different strategies to help her think through passing appropriate tasks to each person in her family.  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  Discussed ways that she can approach that with the person at the stables.  Interventions:  Solution-Oriented/Positive Psychology, Eye Movement Desensitization and Reprocessing (EMDR), and Insight-Oriented  Diagnosis:   ICD-10-CM   1. PTSD (post-traumatic stress disorder)  F43.10       Plan: Patient is to use coping skills to decrease triggered responses.  Patient is to work on limit setting with others and focusing on what are her issues or tasks to complete.  Patient is to focus on self-care and having time for her things that have to be accomplished.  Patient is to take medication as directed. Long term goal: Develop and implement effective coping skills that allow for carrying out normal responsibilities and participating relationships and social activities. Short term goal: Practice, implement relaxation training as a coping  Mechanism for tension, panic, stress, anger and anxiety  Lina Sayre, [facility]

## 2020-12-26 NOTE — Telephone Encounter (Signed)
Records received, placed in Dr Vivia Ewing in box on his desk.

## 2021-01-03 ENCOUNTER — Telehealth: Payer: Self-pay | Admitting: General Surgery

## 2021-01-03 NOTE — Telephone Encounter (Signed)
Received CT results from [facility] and took them over to cancer center for them to schedule patient for splenic granuloma

## 2021-01-05 ENCOUNTER — Encounter: Payer: Self-pay | Admitting: Psychiatry

## 2021-01-05 ENCOUNTER — Ambulatory Visit (INDEPENDENT_AMBULATORY_CARE_PROVIDER_SITE_OTHER): Payer: BC Managed Care – PPO | Admitting: Psychiatry

## 2021-01-05 DIAGNOSIS — F5105 Insomnia due to other mental disorder: Secondary | ICD-10-CM

## 2021-01-05 DIAGNOSIS — F4310 Post-traumatic stress disorder, unspecified: Secondary | ICD-10-CM

## 2021-01-05 DIAGNOSIS — F99 Mental disorder, not otherwise specified: Secondary | ICD-10-CM

## 2021-01-05 DIAGNOSIS — F330 Major depressive disorder, recurrent, mild: Secondary | ICD-10-CM

## 2021-01-05 MED ORDER — TRAZODONE HCL 50 MG PO TABS: ORAL_TABLET | ORAL | 2 refills | Status: DC

## 2021-01-05 MED ORDER — ARIPIPRAZOLE 2 MG PO TABS: 2.0000 mg | ORAL_TABLET | Freq: Every day | ORAL | 1 refills | Status: DC

## 2021-01-05 MED ORDER — SERTRALINE HCL 100 MG PO TABS: 150.0000 mg | ORAL_TABLET | Freq: Every day | ORAL | 1 refills | Status: DC

## 2021-01-05 NOTE — Progress Notes (Signed)
[facility] Counselor/Therapist Progress Note  Patient ID: [name], MRN: [id number],    Date: 01/05/2021  Time Spent: 58 minutes start time 12:05 PM end time 1:03 PM Virtual Visit via Video Note Connected with patient by a telemedicine/telehealth application, with their informed consent, and verified patient privacy and that I am speaking with the correct person using two identifiers. I discussed the limitations, risks, security and privacy concerns of performing psychotherapy and the availability of in person appointments. I also discussed with the patient that there may be a patient responsible charge related to this service. The patient expressed understanding and agreed to proceed. I discussed the treatment planning with the patient. The patient was provided an opportunity to ask questions and all were answered. The patient agreed with the plan and demonstrated an understanding of the instructions. The patient was advised to call  our office if  symptoms worsen or feel they are in a crisis state and need immediate contact.   Therapist Location: office Patient Location: home    Treatment Type: Individual Therapy  Reported Symptoms: anxiety, GI issues,   Mental Status Exam:  Appearance:   Casual     Behavior:  Appropriate  Motor:  Normal  Speech/Language:   Normal Rate  Affect:  Appropriate  Mood:  anxious  Thought process:  normal  Thought content:    WNL  Sensory/Perceptual disturbances:    WNL  Orientation:  oriented to person, place, time/date, and situation  Attention:  Good  Concentration:  Good  Memory:  WNL  Fund of knowledge:   Good  Insight:    Good  Judgment:   Good  Impulse Control:  Good   Risk Assessment: Danger to Self:  No Self-injurious Behavior: No Danger to Others: No Duty to Warn:no Physical Aggression / Violence:No  Access to Firearms a concern: No  Gang Involvement:No   Subjective: Met with patient via virtual session due to her  having 2 children in her house with COVID. Patient shared that things are going but she has been very anxious with so much to do. She shared she is sleeping better which is good and she feels that her night eating has decreased. She shared that she was able to follow plans from last session and that had worked for her.  Patient stated that she had had some memories of the past when she would get angry and harm herself.  Worked on those memories in session, suds level 10, negative cognition "I am out of control" felt sadness in her stomach.  Patient was able to reduce suds level to 4.  She was able to recognize that those behaviors occurred because she did not have the resources or support to be able to manage her emotions appropriately at the young age.  Patient was able to recognize several other tools that she has currently and how she is not at that same place that she was at a young age.  Patient was able to recognize that she currently can set limits with her father and does not have to just internalize her emotions any longer.  How to make the holiday with him and her mother present positive for her were discussed in session and plans were developed.  Interventions: Cognitive Behavioral Therapy, Solution-Oriented/Positive Psychology, Eye Movement Desensitization and Reprocessing (EMDR), and Insight-Oriented  Diagnosis:   ICD-10-CM   1. PTSD (post-traumatic stress disorder)  F43.10       Plan: Patient is to use CBT and coping  skills to decrease triggered responses. Patient is to work on self care by spending time with her [place], listening to mediation apps, and taking care of her body. Patient is to continue working with providers on medical issues.  Patient is to take medication as directed.  Long term goal: Develop and implement effective coping skills that allow for carrying out normal responsibilities and participating relationships and social activities. Short term goal: Practice, implement  relaxation training as a coping  Mechanism for tension, panic, stress, anger and anxiety  Lina Sayre, [facility]

## 2021-01-05 NOTE — Progress Notes (Signed)
[name] [id number] Feb 04, 1975 45 y.o.  Virtual Visit via Telephone Note  I connected with pt on 01/06/21 at 10:00 AM EST by telephone and verified that I am speaking with the correct person using two identifiers.   I discussed the limitations, risks, security and privacy concerns of performing an evaluation and management service by telephone and the availability of in person appointments. I also discussed with the patient that there may be a patient responsible charge related to this service. The patient expressed understanding and agreed to proceed.   I discussed the assessment and treatment plan with the patient. The patient was provided an opportunity to ask questions and all were answered. The patient agreed with the plan and demonstrated an understanding of the instructions.   The patient was advised to call back or seek an in-person evaluation if the symptoms worsen or if the condition fails to improve as anticipated.  I provided 30 minutes of non-face-to-face time during this encounter.  The patient was located at home.  The provider was located at [place].   Thayer Headings, PMHNP   Subjective:   Patient ID:  [name] is a 45 y.o. (DOB May 25, 1975) female.  Chief Complaint:  Chief Complaint  Patient presents with   Depression   Anxiety    Depression        Past medical history includes anxiety.   Anxiety    [name] presents for follow-up of anxiety, depression, and sleep disturbance. "I've been doing good on the medication." She reports that her energy is low during the day. Motivation has been low at times. Describes feeling "blah." She reports, "I've been feeling a lot more anxious than I have in awhile... there's been a lot going on." Notices increased worry. Reports having a panic attack a couple of weeks ago and was able to talk herself through this. Appetite has been fine. Has been trying to lose weight and is using Noom. Has had less  eating during the night  (once every 2-3 weeks instead of almost nightly) and has been journaling to try to identify triggers. She has been using an ap called "Headspace" to help with sleep and meditation. She reports that her usual coping strategies have not been as effective. She reports that she notices increased pain if she does not take Trazodone. Sleep improved with Headspace and Trazodone. Improved nightmares. Concentration has been adequate overall. Denies SI.   Children have had COVID this week and they have been quarantining.   Father was dx'd with cancer. Mother also had some recent needs. Some school issues with children.   Infrequently taking Xanax prn.   Past Psychiatric Medication Trials: Effexor XR Wellbutrin- Causes GI upset in the morning. Prefers to take in the morning. Trazodone- Reports that she has taken it intermittently. Reports that 25 mg is ineffective. Sertraline Doxazosin- helpful for nightmares. Noticed some fatigue Prazosin- Not effective and caused Silenor- Reports that 3 mg was effective but cost prohibitive. Doxepin- Excessive somnolence Gabapentin- caused brain fog  Review of Systems:  Review of Systems  Gastrointestinal:        Abdominal pain and pain in side  Musculoskeletal:  Negative for gait problem.  Neurological:  Negative for tremors.  Psychiatric/Behavioral:  Positive for depression.        Please refer to HPI   Medications: I have reviewed the patient's current medications.  Current Outpatient Medications  Medication Sig Dispense Refill   ALPRAZolam (XANAX) 0.5 MG tablet Take 1/2-1 tab po BID  prn anxiety/panic 30 tablet 2   APPLE CIDER VINEGAR PO Take by mouth.     ARIPiprazole (ABILIFY) 2 MG tablet Take 1 tablet (2 mg total) by mouth daily. 30 tablet 1   Ascorbic Acid (VITAMIN C) 100 MG tablet Take 100 mg by mouth daily.     buPROPion (WELLBUTRIN XL) 300 MG 24 hr tablet Take 1 tablet (300 mg total) by mouth daily. 90 tablet 3   calcium  carbonate (OS-CAL - DOSED IN MG OF ELEMENTAL CALCIUM) 1250 (500 Ca) MG tablet Take 1 tablet by mouth.     MAGNESIUM PO Take by mouth.     Multiple Vitamins-Minerals (MULTIVITAMIN PO) Take by mouth daily.     omeprazole (PRILOSEC OTC) 20 MG tablet Take 20 mg by mouth daily as needed.     Probiotic Product (PROBIOTIC PO) Take by mouth.     sertraline (ZOLOFT) 100 MG tablet Take 1.5 tablets (150 mg total) by mouth daily. 135 tablet 1   traZODone (DESYREL) 50 MG tablet Take 1/2-1 tab po QHS prn insomnia 90 tablet 2   No current facility-administered medications for this visit.    Medication Side Effects: None  Allergies:  Allergies  Allergen Reactions   Meloxicam Nausea And Vomiting   Penicillins Nausea And Vomiting    Past Medical History:  Diagnosis Date   Anxiety    Back pain    Chronic hip pain    Depression    GERD (gastroesophageal reflux disease)     Family History  Problem Relation Age of Onset   Hyperlipidemia Mother    Depression Mother    Anxiety disorder Mother    Neuropathy Mother    Fibromyalgia Mother    Heart disease Father    Depression Father    Prostate cancer Father    Stomach cancer Father    Bipolar disorder Maternal Grandmother    Depression Maternal Uncle    Colon cancer Neg Hx    Esophageal cancer Neg Hx    Rectal cancer Neg Hx     Social History   Socioeconomic History   Marital status: Married    Spouse name: Not on file   Number of children: 3   Years of education: 2 years of college   Highest education level: Not on file  Occupational History   Occupation: Homemaker  Tobacco Use   Smoking status: Never   Smokeless tobacco: Never  Vaping Use   Vaping Use: Never used  Substance and Sexual Activity   Alcohol use: Yes    Comment: Occasionally   Drug use: No   Sexual activity: Not on file  Other Topics Concern   Not on file  Social History Narrative   Lives at home with her husband and children.   Right-handed.   1 cup  caffeine day.   Social Determinants of Health   Financial Resource Strain: Not on file  Food Insecurity: Not on file  Transportation Needs: Not on file  Physical Activity: Not on file  Stress: Not on file  Social Connections: Not on file  Intimate Partner Violence: Not on file    Past Medical History, Surgical history, Social history, and Family history were reviewed and updated as appropriate.   Please see review of systems for further details on the patient's review from today.   Objective:   Physical Exam:  There were no vitals taken for this visit.  Physical Exam Constitutional:      General: She is not in acute distress.  Musculoskeletal:        General: No deformity.  Neurological:     Mental Status: She is alert and oriented to person, place, and time.     Coordination: Coordination normal.  Psychiatric:        Attention and Perception: Attention and perception normal. She does not perceive auditory or visual hallucinations.        Mood and Affect: Mood is anxious. Affect is not labile, blunt, angry or inappropriate.        Speech: Speech normal.        Behavior: Behavior normal.        Thought Content: Thought content normal. Thought content is not paranoid or delusional. Thought content does not include homicidal or suicidal ideation. Thought content does not include homicidal or suicidal plan.        Cognition and Memory: Cognition and memory normal.        Judgment: Judgment normal.     Comments: Insight intact Dysthymic mood    Lab Review:  No results found for: NA, K, CL, CO2, GLUCOSE, BUN, CREATININE, CALCIUM, PROT, ALBUMIN, AST, ALT, ALKPHOS, BILITOT, GFRNONAA, GFRAA     Component Value Date/Time   WBC 15.8 (H) 04/08/2007 0338   RBC 4.21 04/08/2007 0338   HGB 13.4 04/08/2007 0338   HCT 39.5 04/08/2007 0338   PLT 291 04/08/2007 0338   MCV 93.9 04/08/2007 0338   MCHC 33.8 04/08/2007 0338   RDW 16.5 (H) 04/08/2007 0338    No results found for:  POCLITH, LITHIUM   No results found for: PHENYTOIN, PHENOBARB, VALPROATE, CBMZ   .res Assessment: Plan:   Pt seen for 30 minutes and time spent discussing her questions about Abilify and discussing indications, dosing, and potential benefits. Discussed potential metabolic side effects associated with atypical antipsychotics, as well as potential risk for movement side effects. Advised pt to contact office if movement side effects occur. Pt agrees to trial of Abilify 2 mg daily. Will start Abilify 2 mg po qd for augmentation of depression.  Continue Wellbutrin XL 300 mg po qd for depression.  Continue Sertraline 150 mg po qd for anxiety and depression.  Continue Trazodone 50 mg 1/2-1 tab po QHS prn insomnia.  Continue Alprazolam 0.5 mg 1/2-1 tab po BID prn anxiety. Will discontinue Doxazosin due to non-use since nightmares have resolved. Discussed that Doxazosin could be re-started in the future if needed, ie. Recurrence of nightmares following trigger. Recommend continuing therapy with Lina Sayre, [facility].  Pt to follow-up with this provider in 1-2 months or sooner if clinically indicated.  Patient advised to contact office with any questions, adverse effects, or acute worsening in signs and symptoms.    Oliviah was seen today for depression and anxiety.  Diagnoses and all orders for this visit:  Mild episode of recurrent major depressive disorder (HCC) -     ARIPiprazole (ABILIFY) 2 MG tablet; Take 1 tablet (2 mg total) by mouth daily.  PTSD (post-traumatic stress disorder) -     sertraline (ZOLOFT) 100 MG tablet; Take 1.5 tablets (150 mg total) by mouth daily.  Insomnia due to other mental disorder -     traZODone (DESYREL) 50 MG tablet; Take 1/2-1 tab po QHS prn insomnia   Please see After Visit Summary for patient specific instructions.  Future Appointments  Date Time Provider [place]  01/12/2021 10:40 AM Lavena Bullion, DO [facility]  01/26/2021 10:00 AM  Lina Sayre, [facility] CP-CP None  02/21/2021  9:30 AM Eulas Post,  Janett Billow, PMHNP CP-CP None  02/22/2021  9:00 AM Lina Sayre, [facility] CP-CP None  03/07/2021 12:00 PM Lina Sayre, [facility] CP-CP None  03/21/2021 12:00 PM Lina Sayre, [facility] CP-CP None    No orders of the defined types were placed in this encounter.     -------------------------------

## 2021-01-09 ENCOUNTER — Telehealth: Payer: Self-pay | Admitting: General Surgery

## 2021-01-09 NOTE — Telephone Encounter (Signed)
Tried to contact the patient to advise her that Dr Marin Olp reviewed the CT and doesn't feel he needs to see the patient.

## 2021-01-09 NOTE — Telephone Encounter (Signed)
-----   Message from [place], DO sent at 01/09/2021  1:26 PM EST ----- Regarding: RE: Referral Sounds reasonable. Thanks!  Can you please update the patient on that as well? Thanks.  ----- Message ----- From: Letta Pate, CMA Sent: 01/05/2021   8:51 AM EST To: Lavena Bullion, DO Subject: FW: Referral                                   Just an FYI ----- Message ----- From: Cordelia Poche, RN Sent: 01/04/2021   3:54 PM EST To: Letta Pate, CMA Subject: Referral                                       Dr Marin Olp reviewed the CT report on this patient and doesn't feel like he needs to see this patient. Splenic granulomas are typically scar tissue from previous infection. If there is additional information which needs to be considered, please let me know.  Thank you  Roselyn Reef  [phone]

## 2021-01-12 ENCOUNTER — Ambulatory Visit (INDEPENDENT_AMBULATORY_CARE_PROVIDER_SITE_OTHER): Payer: BC Managed Care – PPO | Admitting: Gastroenterology

## 2021-01-12 ENCOUNTER — Encounter: Payer: Self-pay | Admitting: Gastroenterology

## 2021-01-12 ENCOUNTER — Other Ambulatory Visit: Payer: Self-pay

## 2021-01-12 VITALS — BP 122/78 | HR 97 | Ht 62.5 in | Wt 167.0 lb

## 2021-01-12 DIAGNOSIS — D7389 Other diseases of spleen: Secondary | ICD-10-CM | POA: Diagnosis not present

## 2021-01-12 DIAGNOSIS — R109 Unspecified abdominal pain: Secondary | ICD-10-CM

## 2021-01-12 DIAGNOSIS — K7689 Other specified diseases of liver: Secondary | ICD-10-CM

## 2021-01-12 DIAGNOSIS — K219 Gastro-esophageal reflux disease without esophagitis: Secondary | ICD-10-CM

## 2021-01-12 NOTE — Patient Instructions (Signed)
If you are age 46 or older, your body mass index should be between 23-30. Your Body mass index is 30.06 kg/m. If this is out of the aforementioned range listed, please consider follow up with your Primary Care Provider.  If you are age 5 or younger, your body mass index should be between 19-25. Your Body mass index is 30.06 kg/m. If this is out of the aformentioned range listed, please consider follow up with your Primary Care Provider.   __________________________________________________________  The [place] [facility] providers would like to encourage you to use [facility] to communicate with providers for non-urgent requests or questions.  Due to long hold times on the telephone, sending your provider a message by [facility] may be a faster and more efficient way to get a response.  Please allow 48 business hours for a response.  Please remember that this is for non-urgent requests.    Follow up as needed.  Thank you for choosing me and [facility].  Vito Cirigliano, D.O.

## 2021-01-12 NOTE — Progress Notes (Signed)
Chief Complaint:    Abdominal wall pain  GI History: 45 year old female with a history of anxiety, depression, GERD, follows in GI clinic with chronic LUQ pain.  Describes pinpoint pain under left costal margin, worse with prolonged walking.  No associated changes in bowel habits, hematochezia, melena, nausea, fever, vomiting.  Unresponsive to conservative management. - 12/01/2020: Abdominal wall injection with lidocaine with relief of pain followed by injection of Kenalog 40 mg.  Actually injected 2 sites in the LUQ. - 01/12/2021: Patient presents for follow-up appointment.  Reports only having relief of pain for a couple of hours after abdominal wall injection.  Index symptoms have since returned.  HPI:     Patient is a 45 y.o. female presenting to the Gastroenterology Clinic for follow-up.  Was initially seen by me on 12/01/2020 as a referral for abdominal wall injection.  Was initially treated with lidocaine with relief of pain then injected with Kenalog.  Patient reports only having a couple hours of pain relief, then symptoms started to recur the following day and have been present since then.  Pain not as intense as it was prior to the injections though.  Again no associated changes in bowel habits, relationship with p.o. intake, etc.  Has since treated with heating pad and ibuprofen/Tylenol as needed. Does tend to improve with heating pad, but eventually recurs.   Review of systems:     No chest pain, no SOB, no fevers, no urinary sx   Past Medical History:  Diagnosis Date   Anxiety    Back pain    Chronic hip pain    Depression    GERD (gastroesophageal reflux disease)     Patient's surgical history, family medical history, social history, medications and allergies were all reviewed in Epic    Current Outpatient Medications  Medication Sig Dispense Refill   ALPRAZolam (XANAX) 0.5 MG tablet Take 1/2-1 tab po BID prn anxiety/panic 30 tablet 2   APPLE CIDER VINEGAR PO Take by  mouth.     ARIPiprazole (ABILIFY) 2 MG tablet Take 1 tablet (2 mg total) by mouth daily. 30 tablet 1   Ascorbic Acid (VITAMIN C) 100 MG tablet Take 100 mg by mouth daily.     buPROPion (WELLBUTRIN XL) 300 MG 24 hr tablet Take 1 tablet (300 mg total) by mouth daily. 90 tablet 3   calcium carbonate (OS-CAL - DOSED IN MG OF ELEMENTAL CALCIUM) 1250 (500 Ca) MG tablet Take 1 tablet by mouth.     MAGNESIUM PO Take by mouth.     Multiple Vitamins-Minerals (MULTIVITAMIN PO) Take by mouth daily.     omeprazole (PRILOSEC OTC) 20 MG tablet Take 20 mg by mouth daily as needed.     Probiotic Product (PROBIOTIC PO) Take by mouth.     sertraline (ZOLOFT) 100 MG tablet Take 1.5 tablets (150 mg total) by mouth daily. 135 tablet 1   traZODone (DESYREL) 50 MG tablet Take 1/2-1 tab po QHS prn insomnia 90 tablet 2   No current facility-administered medications for this visit.    Physical Exam:     BP 122/78    Pulse 97    Ht 5' 2.5" (1.588 m)    Wt 167 lb (75.8 kg)    SpO2 92%    BMI 30.06 kg/m   GENERAL:  Pleasant female in NAD PSYCH: : Cooperative, normal affect ABDOMEN:  Pinpoint TTP in LUQ, +Carnett's sign.  Nondistended, soft.  Has a marker dot over the site of pain.  No other areas of tenderness.  No rebound or guarding.  No peritoneal signs.  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  Did have temporary relief of pain and overall pain improved with prior injection, which is again highly suggestive of Abdominal Wall Syndrome and may be responsive to second injection.  We discussed the pros/cons of repeat injection, and she  is requesting repeat abdominal wall injection for diagnostic and therapeutic intent.  All risks, benefits and alternative forms of therapy were described and informed consent was obtained.  -The patient was draped and the site prepped in the usual sterile fashion.  Site of pain was reconfirmed by manual palpation. -Lidocaine 1%.  Injected 5 cc into the site of pain, with resolution of pain upon repeat palpation.  This confirmed the diagnosis of Abdominal Wall Syndrome. -Kenalog 40 mg.  Injected directly into the site of pain for therapeutic intervention of the above confirmed Abdominal Wall Syndrome. -Band-Aid applied - Chaperone: Renee Rival, CMA -The patient was observed in the Gastroenterology Clinic for 15 minutes after the procedure. No complications were encountered and the patient tolerated the procedure well.  - If incomplete relief or pain recurs, plan for referral to Pain Management for additional evaluation and treatment options  2) Hepatic Steatosis: - Incidentally noted hepatic steatosis on recent CT.  Otherwise normal liver enzymes. - Recommend healthy diet with regular exercise - Previously offered RUQ [place] for further evaluation.  Owing to normal liver enzymes, she deferred for now - Repeat liver enzymes in 6-12 months.  If elevated, plan for further work-up  3) Colon cancer screening - Cologuard negative in 10/2020   4) Splenic Granuloma - CT report reviewed by Hematologist and does not feel that further work-up needed  5) GERD - Rare episodes of reflux symptoms, mainly from dietary indiscretions.  Takes PPI infrequently. - Continue dietary/lifestyle modification with avoidance of exacerbating foods/activities - Okay to resume on demand PPI only.  Given infrequency, do not feel that daily medication nor endoscopic exam needed at this time, and she agrees     I spent an additional 20 minutes of nonprocedural time, including independent review of results as outlined above,  communicating results with the patient directly, face-to-face time with the patient, coordinating care, ordering studies and medications as appropriate, and documentation.     Lavena Bullion ,DO, FACG 01/12/2021, 10:54 AM

## 2021-01-18 ENCOUNTER — Emergency Department (HOSPITAL_BASED_OUTPATIENT_CLINIC_OR_DEPARTMENT_OTHER): Payer: BC Managed Care – PPO

## 2021-01-18 ENCOUNTER — Emergency Department (HOSPITAL_BASED_OUTPATIENT_CLINIC_OR_DEPARTMENT_OTHER)
Admission: EM | Admit: 2021-01-18 | Discharge: 2021-01-19 | Disposition: A | Discharge status: Home / Self Care | Payer: BC Managed Care – PPO | Attending: Emergency Medicine | Admitting: Emergency Medicine

## 2021-01-18 ENCOUNTER — Encounter (HOSPITAL_BASED_OUTPATIENT_CLINIC_OR_DEPARTMENT_OTHER): Payer: Self-pay

## 2021-01-18 ENCOUNTER — Other Ambulatory Visit: Payer: Self-pay

## 2021-01-18 DIAGNOSIS — Z20822 Contact with and (suspected) exposure to covid-19: Secondary | ICD-10-CM | POA: Insufficient documentation

## 2021-01-18 DIAGNOSIS — R519 Headache, unspecified: Secondary | ICD-10-CM

## 2021-01-18 DIAGNOSIS — Z79899 Other long term (current) drug therapy: Secondary | ICD-10-CM | POA: Diagnosis not present

## 2021-01-18 DIAGNOSIS — M542 Cervicalgia: Secondary | ICD-10-CM | POA: Diagnosis not present

## 2021-01-18 DIAGNOSIS — R112 Nausea with vomiting, unspecified: Secondary | ICD-10-CM | POA: Diagnosis not present

## 2021-01-18 LAB — COMPREHENSIVE METABOLIC PANEL
ALT: 22 U/L (ref 0–44)
AST: 23 U/L (ref 15–41)
Albumin: 4 g/dL (ref 3.5–5.0)
Alkaline Phosphatase: 62 U/L (ref 38–126)
Anion gap: 8 (ref 5–15)
BUN: 19 mg/dL (ref 6–20)
CO2: 26 mmol/L (ref 22–32)
Calcium: 9.5 mg/dL (ref 8.9–10.3)
Chloride: 100 mmol/L (ref 98–111)
Creatinine, Ser: 1.03 mg/dL — ABNORMAL HIGH (ref 0.44–1.00)
GFR, Estimated: >60 mL/min (ref >60)
Glucose, Bld: 121 mg/dL — ABNORMAL HIGH (ref 70–99)
Potassium: 3.8 mmol/L (ref 3.5–5.1)
Sodium: 134 mmol/L — ABNORMAL LOW (ref 135–145)
Total Bilirubin: 0.4 mg/dL (ref 0.3–1.2)
Total Protein: 6.9 g/dL (ref 6.5–8.1)

## 2021-01-18 LAB — CBC WITH DIFFERENTIAL/PLATELET
Abs Immature Granulocytes: 0.07 10*3/uL (ref 0.00–0.07)
Basophils Absolute: 0 10*3/uL (ref 0.0–0.1)
Basophils Relative: 0 %
Eosinophils Absolute: 0 10*3/uL (ref 0.0–0.5)
Eosinophils Relative: 0 %
HCT: 38.6 % (ref 36.0–46.0)
Hemoglobin: 13.3 g/dL (ref 12.0–15.0)
Immature Granulocytes: 1 %
Lymphocytes Relative: 17 %
Lymphs Abs: 2.1 10*3/uL (ref 0.7–4.0)
MCH: 32.6 pg (ref 26.0–34.0)
MCHC: 34.5 g/dL (ref 30.0–36.0)
MCV: 94.6 fL (ref 80.0–100.0)
Monocytes Absolute: 1 10*3/uL (ref 0.1–1.0)
Monocytes Relative: 8 %
Neutro Abs: 9.3 10*3/uL — ABNORMAL HIGH (ref 1.7–7.7)
Neutrophils Relative %: 74 %
Platelets: 306 10*3/uL (ref 150–400)
RBC: 4.08 MIL/uL (ref 3.87–5.11)
RDW: 13.2 % (ref 11.5–15.5)
WBC: 12.6 10*3/uL — ABNORMAL HIGH (ref 4.0–10.5)
nRBC: 0 % (ref 0.0–0.2)

## 2021-01-18 LAB — RESP PANEL BY RT-PCR (FLU A&B, COVID) ARPGX2
Influenza A by PCR: NEGATIVE
Influenza B by PCR: NEGATIVE
SARS Coronavirus 2 by RT PCR: NEGATIVE

## 2021-01-18 LAB — HCG, SERUM, QUALITATIVE: Preg, Serum: NEGATIVE

## 2021-01-18 MED ORDER — IOHEXOL 350 MG/ML SOLN
75.0000 mL | Freq: Once | INTRAVENOUS | Status: AC | PRN
  Administered 2021-01-18: 75 mL via INTRAVENOUS

## 2021-01-18 MED ORDER — PROCHLORPERAZINE EDISYLATE 10 MG/2ML IJ SOLN
10.0000 mg | Freq: Once | INTRAMUSCULAR | Status: AC
  Administered 2021-01-18: 23:00:00 10 mg via INTRAVENOUS
  Filled 2021-01-18: qty 2

## 2021-01-18 MED ORDER — DIPHENHYDRAMINE HCL 50 MG/ML IJ SOLN
25.0000 mg | Freq: Once | INTRAMUSCULAR | Status: AC
  Administered 2021-01-18: 23:00:00 25 mg via INTRAVENOUS
  Filled 2021-01-18: qty 1

## 2021-01-18 MED ORDER — IOHEXOL 300 MG/ML  SOLN: 100.0000 mL | Freq: Once | INTRAMUSCULAR | Status: DC | PRN

## 2021-01-18 MED ORDER — SODIUM CHLORIDE 0.9 % IV BOLUS
1000.0000 mL | Freq: Once | INTRAVENOUS | Status: AC
  Administered 2021-01-18: 23:00:00 1000 mL via INTRAVENOUS

## 2021-01-18 NOTE — ED Triage Notes (Signed)
Pt c/o coughing and pain to left forehead-sharp shooting pain/nausea started ~1 hour PTA-denies vision issues-NAD-steady gait

## 2021-01-18 NOTE — ED Provider Notes (Signed)
[facility] Provider Note   CSN: [id number] Arrival date & time: 01/18/21  2016     History Chief Complaint  Patient presents with   Headache    [name] is a 45 y.o. female.  The history is provided by the patient and medical records. No language interpreter was used.  Headache Pain location:  Generalized Quality:  Dull Radiates to:  R neck Severity currently:  9/10 Severity at highest:  9/10 Onset quality:  Sudden Duration:  2 hours Timing:  Constant Progression:  Unchanged Chronicity:  New Similar to prior headaches: no   Context: bright light and coughing   Relieved by:  Nothing Worsened by:  Light and neck movement Ineffective treatments:  None tried Associated symptoms: nausea, neck pain, photophobia and vomiting   Associated symptoms: no abdominal pain, no back pain, no blurred vision, no congestion, no cough, no diarrhea, no fatigue, no fever, no neck stiffness, no numbness, no paresthesias, no seizures, no tingling, no visual change and no weakness       Past Medical History:  Diagnosis Date   Anxiety    Back pain    Chronic hip pain    Depression    GERD (gastroesophageal reflux disease)     Patient Active Problem List   Diagnosis Date Noted   Major depressive disorder, recurrent episode, mild ([place]) 03/17/2018   PTSD (post-traumatic stress disorder) 10/31/2017   Nonallopathic lesion of cervical region 10/17/2017   Nonallopathic lesion of rib cage 10/17/2017   Greater trochanteric bursitis of both hips 08/14/2017   Nonallopathic lesion of lumbosacral region 09/12/2016   Nonallopathic lesion of sacral region 09/12/2016   Nonallopathic lesion of thoracic region 09/12/2016   Paresthesia 12/30/2015   Low back pain 11/08/2015    Past Surgical History:  Procedure Laterality Date   DILATION AND CURETTAGE OF UTERUS     FOOT SURGERY     MOUTH SURGERY       OB History   No obstetric history on file.      Family History  Problem Relation Age of Onset   Hyperlipidemia Mother    Depression Mother    Anxiety disorder Mother    Neuropathy Mother    Fibromyalgia Mother    Heart disease Father    Depression Father    Prostate cancer Father    Stomach cancer Father    Bipolar disorder Maternal Grandmother    Depression Maternal Uncle    Colon cancer Neg Hx    Esophageal cancer Neg Hx    Rectal cancer Neg Hx     Social History   Tobacco Use   Smoking status: Never   Smokeless tobacco: Never  Vaping Use   Vaping Use: Never used  Substance Use Topics   Alcohol use: Not Currently   Drug use: No    Home Medications Prior to Admission medications   Medication Sig Start Date End Date Taking? Authorizing Provider  ALPRAZolam Duanne Moron) 0.5 MG tablet Take 1/2-1 tab po BID prn anxiety/panic 09/21/20   Thayer Headings, PMHNP  APPLE CIDER VINEGAR PO Take by mouth.    [provider]  ARIPiprazole (ABILIFY) 2 MG tablet Take 1 tablet (2 mg total) by mouth daily. 01/05/21   Thayer Headings, PMHNP  Ascorbic Acid (VITAMIN C) 100 MG tablet Take 100 mg by mouth daily.    [provider]  buPROPion (WELLBUTRIN XL) 300 MG 24 hr tablet Take 1 tablet (300 mg total) by mouth daily. 04/05/20  Thayer Headings, PMHNP  calcium carbonate (OS-CAL - DOSED IN MG OF ELEMENTAL CALCIUM) 1250 (500 Ca) MG tablet Take 1 tablet by mouth.    [provider]  MAGNESIUM PO Take by mouth.    [provider]  Multiple Vitamins-Minerals (MULTIVITAMIN PO) Take by mouth daily.    [provider]  omeprazole (PRILOSEC OTC) 20 MG tablet Take 20 mg by mouth daily as needed.    [provider]  Probiotic Product (PROBIOTIC PO) Take by mouth.    [provider]  sertraline (ZOLOFT) 100 MG tablet Take 1.5 tablets (150 mg total) by mouth daily. 01/05/21 04/05/21  Thayer Headings, PMHNP  traZODone (DESYREL) 50 MG tablet Take 1/2-1 tab po QHS prn insomnia 01/05/21   Thayer Headings, PMHNP    Allergies    Meloxicam and Penicillins  Review of Systems   Review of Systems  Constitutional:  Negative for chills, diaphoresis, fatigue and fever.  HENT:  Negative for congestion.   Eyes:  Positive for photophobia. Negative for blurred vision.  Respiratory:  Negative for cough, chest tightness and shortness of breath.   Gastrointestinal:  Positive for nausea and vomiting. Negative for abdominal pain and diarrhea.  Musculoskeletal:  Positive for neck pain. Negative for back pain and neck stiffness.  Neurological:  Positive for light-headedness and headaches. Negative for seizures, syncope, speech difficulty, weakness, numbness and paresthesias.  Psychiatric/Behavioral:  Negative for agitation.   All other systems reviewed and are negative.  Physical Exam Updated Vital Signs BP 134/87 (BP Location: Left Arm)    Pulse 85    Temp 98.2 F (36.8 C) (Oral)    Resp 20    Ht 5' 2"  (1.575 m)    Wt 75.8 kg    LMP 01/03/2021    SpO2 97%    BMI 30.54 kg/m   Physical Exam Vitals and nursing note reviewed.  Constitutional:      General: She is not in acute distress.    Appearance: She is well-developed. She is not ill-appearing, toxic-appearing or diaphoretic.  HENT:     Head: Normocephalic and atraumatic.     Comments: Right pupil larger than left.  Normal extraocular movements. Eyes:     Conjunctiva/sclera: Conjunctivae normal.     Pupils: Pupils are unequal.  Cardiovascular:     Rate and Rhythm: Normal rate and regular rhythm.     Heart sounds: No murmur heard. Pulmonary:     Effort: Pulmonary effort is normal. No respiratory distress.     Breath sounds: Normal breath sounds. No wheezing, rhonchi or rales.  Chest:     Chest wall: No tenderness.  Abdominal:     Palpations: Abdomen is soft.     Tenderness: There is no abdominal tenderness.  Musculoskeletal:        General: No swelling.     Cervical back: Neck supple.  Skin:    General: Skin is warm and dry.      Capillary Refill: Capillary refill takes less than 2 seconds.  Neurological:     Mental Status: She is alert.     Cranial Nerves: No dysarthria or facial asymmetry.     Motor: No weakness.  Psychiatric:        Mood and Affect: Mood is anxious.    ED Results / Procedures / Treatments   Labs (all labs ordered are listed, but only abnormal results are displayed) Labs Reviewed  CBC WITH DIFFERENTIAL/PLATELET - Abnormal; Notable for the following components:  Result Value   WBC 12.6 (*)    Neutro Abs 9.3 (*)    All other components within normal limits  COMPREHENSIVE METABOLIC PANEL - Abnormal; Notable for the following components:   Sodium 134 (*)    Glucose, Bld 121 (*)    Creatinine, Ser 1.03 (*)    All other components within normal limits  RESP PANEL BY RT-PCR (FLU A&B, COVID) ARPGX2  HCG, SERUM, QUALITATIVE    EKG None  Radiology No results found.  Procedures Procedures   Medications Ordered in ED Medications  sodium chloride 0.9 % bolus 1,000 mL (1,000 mLs Intravenous New Bag/Given 01/18/21 2247)  prochlorperazine (COMPAZINE) injection 10 mg (10 mg Intravenous Given 01/18/21 2249)  diphenhydrAMINE (BENADRYL) injection 25 mg (25 mg Intravenous Given 01/18/21 2247)  iohexol (OMNIPAQUE) 350 MG/ML injection 75 mL (75 mLs Intravenous Contrast Given 01/18/21 2337)    ED Course  I have reviewed the triage vital signs and the nursing notes.  Pertinent labs & imaging results that were available during my care of the patient were reviewed by me and considered in my medical decision making (see chart for details).    MDM Rules/Calculators/A&P                          [name] is a 45 y.o. female with a past medical history significant for documentation of depression and PTSD who presents with headache.  According to patient, around 8:30 PM she was watching TV and started coughing and then had sudden onset of pain in her head going into her neck.  She reports the  pain is extremely severe and a 9 out of 10 in severity at this time.  She reports some nausea and feeling like she needs to vomit.  She denies history of significant headaches or migraines.  She reports feeling lightheaded like she might pass out which she described as dizzy but denies any head spinning or room spinning sensation.  She denies vision changes or any extremity symptoms.  No history of head trauma.  She reports she was feeling well before the episode of sudden onset headache.  She reports it has not improved in the last 2 hours.  On exam, lungs are clear and chest is nontender.  No murmur.  Abdomen nontender.  Patient has normal sensation and strength in extremities and normal finger-nose-finger testing.  Speech was clear.  Pupils were slightly asymmetric with right pupil larger than the left.  Otherwise normal extraocular movements.  No other focal neurologic deficits appreciated.  She does report photophobia and phonophobia but denies history of migraines.  Given the sudden onset of her headache related to coughing and the anisocoria with right pupil being larger than the left, I am concerned we need to get some imaging to rule out intracranial hemorrhage or subarachnoid hemorrhage.  As her symptoms only began 2 hours ago, I do feel that CT imaging with CTA will likely be sufficient to rule this out I do not feel she needs lumbar puncture at this time.  Of note, patient does report her father had a history of cardiac aneurysm but no known history of intracranial aneurysms.  We will give the patient headache cocktail and get basic lab started.  She already was negative for COVID and flu given the coughing reported.  Anticipate reassessment after imaging is completed.  If imaging is reassuring and she is feeling better, dissipate discharge home.  We discussed this could be  musculoskeletal type spasm and pain related to the coughing that triggered a severe headache.  If any concerning findings  are discovered, anticipate disposition accordingly.  Care transferred to oncoming team awaiting for results of head imaging and reassessment.    Final Clinical Impression(s) / ED Diagnoses Final diagnoses:  Bad headache     Clinical Impression: 1. Bad headache     Disposition: Care transferred to oncoming team awaiting for results of imaging and reassessment   This note was prepared with assistance of Dragon voice recognition software. Occasional wrong-word or sound-a-like substitutions may have occurred due to the inherent limitations of voice recognition software.     Rebakah Cokley, Gwenyth Allegra, MD 01/19/21 [phone]

## 2021-01-19 NOTE — Discharge Instructions (Signed)
Take ibuprofen 600 mg rotated with Tylenol 1000 mg every 4 hours as needed for pain.  Return to the emergency department if you develop worsening headache, weakness, numbness, visual disturbances, or other new and concerning symptoms.

## 2021-01-19 NOTE — ED Provider Notes (Signed)
°  Physical Exam  BP (!) 109/52 (BP Location: Right Arm)    Pulse 76    Temp 98.2 F (36.8 C) (Oral)    Resp 20    Ht 5' 2"  (1.575 m)    Wt 75.8 kg    LMP 01/03/2021    SpO2 96%    BMI 30.54 kg/m   Physical Exam Vitals and nursing note reviewed.  Constitutional:      Appearance: She is well-developed.  HENT:     Head: Normocephalic and atraumatic.  Pulmonary:     Effort: Pulmonary effort is normal.  Neurological:     Mental Status: She is alert and oriented to person, place, and time.    ED Course/Procedures     Procedures  MDM  Care assumed from Dr. Sherry Ruffing at shift change.  Patient awaiting results of a CTA of the head and neck.  She apparently developed a severe headache after coughing this evening.  Patient received a migraine cocktail and upon reassessment is feeling better and requesting to go home.  Her CTA has resulted showing no acute abnormality.  There is no evidence for aneurysm or bleed.  At this point, I feel as though discharge is appropriate.  Patient advised to take ibuprofen as needed and follow-up with primary doctor for persistent or recurrent headaches.       Veryl Speak, MD 01/19/21 [phone]

## 2021-01-26 ENCOUNTER — Ambulatory Visit: Payer: BC Managed Care – PPO | Admitting: Psychiatry

## 2021-02-01 ENCOUNTER — Ambulatory Visit (INDEPENDENT_AMBULATORY_CARE_PROVIDER_SITE_OTHER): Payer: 59 | Admitting: Psychiatry

## 2021-02-01 ENCOUNTER — Other Ambulatory Visit: Payer: Self-pay

## 2021-02-01 DIAGNOSIS — R69 Illness, unspecified: Secondary | ICD-10-CM | POA: Diagnosis not present

## 2021-02-01 DIAGNOSIS — F4310 Post-traumatic stress disorder, unspecified: Secondary | ICD-10-CM

## 2021-02-01 NOTE — Progress Notes (Signed)
°      [facility] Counselor/Therapist Progress Note  Patient ID: [name], MRN: [id number],    Date: 02/01/2021  Time Spent: 43 minutes start time 3:18 PM end time 4:01 PM  Treatment Type: Individual Therapy  Reported Symptoms: fatigue, anxiety, triggered responses, sleep issues  Mental Status Exam:  Appearance:   Well Groomed     Behavior:  Appropriate  Motor:  Normal  Speech/Language:   Normal Rate  Affect:  Appropriate  Mood:  anxious  Thought process:  normal  Thought content:    WNL  Sensory/Perceptual disturbances:    WNL  Orientation:  oriented to person, place, time/date, and situation  Attention:  Good  Concentration:  Good  Memory:  WNL  Fund of knowledge:   Good  Insight:    Good  Judgment:   Good  Impulse Control:  Good   Risk Assessment: Danger to Self:  No Self-injurious Behavior: No Danger to Others: No Duty to Warn:no Physical Aggression / Violence:No  Access to Firearms a concern: No  Gang Involvement:No   Subjective: Patient was present for session.  She shared that she was able to have treatment that has stopped the pain in her stomach.  She reported that she did have a dream that was triggering for her but she did not remember it.  She went on to share that the family had COVID so her parents did not come down for Christmas.  Patient went on to share that she got very overwhelmed and triggered when she stepped on the scales so much so that she broke it and threw it out.  Patient did processing set on the scales, suds level 6, negative cognition "I am too big" felt disappointment in her chest.  Patient was able to reduce suds level to 3.  She was able to think of a lot of different pictures that were tied to wait for her.  Worked on affirming younger parts to her and reminding herself that she is enough.  Patient was also able to recognize that she has been able to lose weight in the past but had been working on a certain diet and decided that she would  go back to working on that at this time.  Interventions: Solution-Oriented/Positive Psychology, Eye Movement Desensitization and Reprocessing (EMDR), and Insight-Oriented  Diagnosis:   ICD-10-CM   1. PTSD (post-traumatic stress disorder)  F43.10       Plan: Patient is to use CBT and coping skills to decrease triggered responses.  Patient is to continue working with her [place] and horses.  Patient is to work on affirmations regularly and to remind herself that she is enough.  Patient is to follow diet plan that she felt was helpful in the past.  Patient is to take medication as directed. Long term goal: Develop and implement effective coping skills that allow for carrying out normal responsibilities and participating relationships and social activities. Short term goal: Practice, implement relaxation training as a coping  Mechanism for tension, panic, stress, anger and anxiety  Lina Sayre, [facility]

## 2021-02-21 ENCOUNTER — Ambulatory Visit: Payer: BC Managed Care – PPO | Admitting: Psychiatry

## 2021-02-22 ENCOUNTER — Other Ambulatory Visit: Payer: Self-pay

## 2021-02-22 ENCOUNTER — Ambulatory Visit (INDEPENDENT_AMBULATORY_CARE_PROVIDER_SITE_OTHER): Payer: 59 | Admitting: Psychiatry

## 2021-02-22 DIAGNOSIS — F4310 Post-traumatic stress disorder, unspecified: Secondary | ICD-10-CM | POA: Diagnosis not present

## 2021-02-22 DIAGNOSIS — R69 Illness, unspecified: Secondary | ICD-10-CM | POA: Diagnosis not present

## 2021-02-22 NOTE — Progress Notes (Signed)
[facility] Counselor/Therapist Progress Note  Patient ID: [name], MRN: [id number],    Date: 02/22/2021  Time Spent: 52 minutes start time 9:07 AM end time 9:59 AM  Treatment Type: Individual Therapy  Reported Symptoms: anxiety, sadness,triggered responses, frustration  Mental Status Exam:  Appearance:   Well Groomed     Behavior:  Appropriate  Motor:  Normal  Speech/Language:   Normal Rate  Affect:  Appropriate  Mood:  anxious  Thought process:  normal  Thought content:    WNL  Sensory/Perceptual disturbances:    WNL  Orientation:  oriented to person, place, time/date, and situation  Attention:  Good  Concentration:  Good  Memory:  WNL  Fund of knowledge:   Good  Insight:    Good  Judgment:   Good  Impulse Control:  Good   Risk Assessment: Danger to Self:  No Self-injurious Behavior: No Danger to Others: No Duty to Warn:no Physical Aggression / Violence:No  Access to Firearms a concern: No  Gang Involvement:No   Subjective: Patient was present for session.  She shared she just found out that her cousin that she was very close to has breast cancer.  Patient shared that her parents did come down for a visit and it went well over all.  She shared that there were some positive and negative moments.  Patient shared she had a negative incident with a friend of hers that has triggered negative memories from her past.  Patient did processing set on friend not sticking to her word, suds level 10, negative cognition "my needs do not matter" felt sadness her and her side.  Patient was able to reduce suds level to 3.  She was able to recognize it was a difficult situation and her friend was trying to make things happen that she did not need to.  She was able to realize the consequences of that will be more for her friend than for her and that she did not do anything wrong.  Patient was encouraged to use the interaction as information on how she wants to proceed in the  relationship.  Interventions: Solution-Oriented/Positive Psychology, Eye Movement Desensitization and Reprocessing (EMDR), and Insight-Oriented  Diagnosis:   ICD-10-CM   1. PTSD (post-traumatic stress disorder)  F43.10       Plan: Patient is to use CBT and coping skills to continue decreasing triggered responses.  Patient is to work on self talk to remind herself that her needs met her and she is enough.  Patient is to exercise to release negative emotions appropriately.  Patient is to take medication as directed.  Patient is to continue working with providers on medical issues. Long term goal: Develop and implement effective coping skills that allow for carrying out normal responsibilities and participating relationships and social activities. Short term goal: Practice, implement relaxation training as a coping  Mechanism for tension, panic, stress, anger and anxiety  Lina Sayre, [facility]

## 2021-03-07 ENCOUNTER — Other Ambulatory Visit: Payer: Self-pay

## 2021-03-07 ENCOUNTER — Telehealth: Payer: Self-pay | Admitting: Psychiatry

## 2021-03-07 ENCOUNTER — Ambulatory Visit (INDEPENDENT_AMBULATORY_CARE_PROVIDER_SITE_OTHER): Payer: 59 | Admitting: Psychiatry

## 2021-03-07 DIAGNOSIS — F4310 Post-traumatic stress disorder, unspecified: Secondary | ICD-10-CM

## 2021-03-07 DIAGNOSIS — R69 Illness, unspecified: Secondary | ICD-10-CM | POA: Diagnosis not present

## 2021-03-07 DIAGNOSIS — F330 Major depressive disorder, recurrent, mild: Secondary | ICD-10-CM

## 2021-03-07 MED ORDER — ARIPIPRAZOLE 2 MG PO TABS: 2.0000 mg | ORAL_TABLET | Freq: Every day | ORAL | 1 refills | Status: DC

## 2021-03-07 NOTE — Telephone Encounter (Signed)
Pt reported to therapist that she is almost out of Abilify and is needing a refill. Will send refill to pt's pharmacy.

## 2021-03-07 NOTE — Telephone Encounter (Signed)
Pt reported she has new pharmacy. [facility]. No longer using [facility].

## 2021-03-07 NOTE — Telephone Encounter (Signed)
Pt reporting has new pharmacy. [facility] [address] [place]. Pt will verify address & call. No longer using [facility].

## 2021-03-07 NOTE — Progress Notes (Signed)
[facility] Counselor/Therapist Progress Note  Patient ID: [name], MRN: [id number],    Date: 03/07/2021  Time Spent: 50 minutes start time 12:12 PM end time 1:02 PM  Treatment Type: Individual Therapy  Reported Symptoms: anxiety, nightmares, triggered responses  Mental Status Exam:  Appearance:   Well Groomed     Behavior:  Appropriate  Motor:  Normal  Speech/Language:   Normal Rate  Affect:  Appropriate  Mood:  anxious  Thought process:  normal  Thought content:    WNL  Sensory/Perceptual disturbances:    WNL  Orientation:  oriented to person, place, time/date, and situation  Attention:  Good  Concentration:  Good  Memory:  WNL  Fund of knowledge:   Good  Insight:    Good  Judgment:   Good  Impulse Control:  Good   Risk Assessment: Danger to Self:  No Self-injurious Behavior: No Danger to Others: No Duty to Warn:no Physical Aggression / Violence:No  Access to Firearms a concern: No  Gang Involvement:No   Subjective: Patient was present for session. She shared her mood was much better overall.  She went on to share her biggest issue is she is having her period every few weeks.  Encouraged her to consult her OBGYN if this continues.  Patient was able to discuss situations with her husband that are very triggering for her.  Discussed what may be occurring on different ways that she could deal with the situation appropriately.  Was encouraged to recognize her progress on multiple issues and to try and help her husband through his current difficulties.  Patient was also encouraged to recognize how things are changing in the relationship with 1 daughter graduating from high school and her other 2 children are moving towards that goal quickly.  Patient was encouraged to continue setting limits with her friend who has her do a lot with the rescue animals so that she does not take too much away from her and her husband's relationship.    Interventions: Cognitive  Behavioral Therapy and Solution-Oriented/Positive Psychology  Diagnosis:   ICD-10-CM   1. PTSD (post-traumatic stress disorder)  F43.10       Plan: Patient is to use CBT and coping skills to continue decreasing triggered responses.  Patient is to work on self talk to remind herself that her needs met her and she is enough.  Patient is to exercise to release negative emotions appropriately.  Patient is to take medication as directed.  Patient is to continue working with providers on medical issues.  Patient is to contact OB/GYN if she continues to have periods very close together. Long term goal: Develop and implement effective coping skills that allow for carrying out normal responsibilities and participating relationships and social activities. Short term goal: Practice, implement relaxation training as a coping  Mechanism for tension, panic, stress, anger and anxiety  Lina Sayre, [facility]

## 2021-03-07 NOTE — Addendum Note (Signed)
Addended by: Sharyl Nimrod on: 03/07/2021 02:07 PM   Modules accepted: Orders

## 2021-03-21 ENCOUNTER — Ambulatory Visit: Payer: BC Managed Care – PPO | Admitting: Psychiatry

## 2021-03-22 ENCOUNTER — Encounter: Payer: Self-pay | Admitting: Psychiatry

## 2021-03-22 ENCOUNTER — Telehealth (INDEPENDENT_AMBULATORY_CARE_PROVIDER_SITE_OTHER): Payer: 59 | Admitting: Psychiatry

## 2021-03-22 DIAGNOSIS — F5105 Insomnia due to other mental disorder: Secondary | ICD-10-CM

## 2021-03-22 DIAGNOSIS — F4310 Post-traumatic stress disorder, unspecified: Secondary | ICD-10-CM

## 2021-03-22 DIAGNOSIS — F3341 Major depressive disorder, recurrent, in partial remission: Secondary | ICD-10-CM | POA: Diagnosis not present

## 2021-03-22 DIAGNOSIS — R69 Illness, unspecified: Secondary | ICD-10-CM | POA: Diagnosis not present

## 2021-03-22 DIAGNOSIS — F99 Mental disorder, not otherwise specified: Secondary | ICD-10-CM

## 2021-03-22 MED ORDER — ARIPIPRAZOLE 2 MG PO TABS: 2.0000 mg | ORAL_TABLET | Freq: Every day | ORAL | 5 refills | Status: DC

## 2021-03-22 MED ORDER — ALPRAZOLAM 0.5 MG PO TABS: ORAL_TABLET | ORAL | 2 refills | Status: DC

## 2021-03-22 MED ORDER — SERTRALINE HCL 100 MG PO TABS: 150.0000 mg | ORAL_TABLET | Freq: Every day | ORAL | 1 refills | Status: DC

## 2021-03-22 MED ORDER — TRAZODONE HCL 50 MG PO TABS: ORAL_TABLET | ORAL | 2 refills | Status: DC

## 2021-03-22 MED ORDER — BUPROPION HCL ER (XL) 300 MG PO TB24: 300.0000 mg | ORAL_TABLET | Freq: Every day | ORAL | 3 refills | Status: DC

## 2021-03-22 NOTE — Progress Notes (Signed)
[name] [id number] [birth year] 46 y.o.  Virtual Visit via Video Note  I connected with pt @ on 03/22/21 at 11:00 AM EST by a video enabled telemedicine application and verified that I am speaking with the correct person using two identifiers.   I discussed the limitations of evaluation and management by telemedicine and the availability of in person appointments. The patient expressed understanding and agreed to proceed.  I discussed the assessment and treatment plan with the patient. The patient was provided an opportunity to ask questions and all were answered. The patient agreed with the plan and demonstrated an understanding of the instructions.   The patient was advised to call back or seek an in-person evaluation if the symptoms worsen or if the condition fails to improve as anticipated.  I provided 25 minutes of non-face-to-face time during this encounter.  The patient was located at home.  The provider was located at [place].   Thayer Headings, PMHNP   Subjective:   Patient ID:  [name] is a 46 y.o. (DOB 1975-11-05) female.  Chief Complaint:  Chief Complaint  Patient presents with   Follow-up    Anxiety, depression, sleep disturbance     HPI [name] presents for follow-up of depression, anxiety, and insomnia.  She reports, "the new medication seems to be helping." She reports taking Abilify at 7 pm and she seems to sleep better and then feel more motivated during the day. She reports improved energy and motivation with Abilify. Mood has been "fine, pretty evened out." Denies depressed mood- "a little more upbeat." She reports that she has been enjoying things more. She reports that she has not had any recent severe episodes of anxiety. She reports that she has some worry and anxiety that she has been able to manage. She reports that her sleep has improved. Denies any middle of the night awakenings. Infrequent nightmares. No change in appetite.  She feels like she has gained a few pounds. Denies food cravings. Concentration has been ok. Denies SI.   They went to visit her in-laws and mother-in-law tested positive for COVID.   She reports that she is taking Xanax prn on rare occasions. Has not needed to take Trazodone prn.   Past Psychiatric Medication Trials: Effexor XR Wellbutrin- Causes GI upset in the morning. Prefers to take in the morning. Trazodone- Reports that she has taken it intermittently. Reports that 25 mg is ineffective. Sertraline Doxazosin- helpful for nightmares. Noticed some fatigue Prazosin- Not effective and caused Silenor- Reports that 3 mg was effective but cost prohibitive. Doxepin- Excessive somnolence Gabapentin- caused brain fog   AIMS=0  Review of Systems:  Review of Systems  Musculoskeletal:  Negative for gait problem.  Neurological:  Negative for tremors.  Psychiatric/Behavioral:         Please refer to HPI   Medications: I have reviewed the patient's current medications.  Current Outpatient Medications  Medication Sig Dispense Refill   APPLE CIDER VINEGAR PO Take by mouth.     calcium carbonate (OS-CAL - DOSED IN MG OF ELEMENTAL CALCIUM) 1250 (500 Ca) MG tablet Take 1 tablet by mouth.     MAGNESIUM PO Take by mouth.     Multiple Vitamins-Minerals (MULTIVITAMIN PO) Take by mouth daily.     omeprazole (PRILOSEC OTC) 20 MG tablet Take 20 mg by mouth daily as needed.     Probiotic Product (PROBIOTIC PO) Take by mouth.     ALPRAZolam (XANAX) 0.5 MG tablet Take 1/2-1 tab po BID  prn anxiety/panic 30 tablet 2   ARIPiprazole (ABILIFY) 2 MG tablet Take 1 tablet (2 mg total) by mouth daily. 30 tablet 5   Ascorbic Acid (VITAMIN C) 100 MG tablet Take 100 mg by mouth daily. (Patient not taking: Reported on 03/22/2021)     buPROPion (WELLBUTRIN XL) 300 MG 24 hr tablet Take 1 tablet (300 mg total) by mouth daily. 90 tablet 3   sertraline (ZOLOFT) 100 MG tablet Take 1.5 tablets (150 mg total) by mouth  daily. 135 tablet 1   traZODone (DESYREL) 50 MG tablet Take 1/2-1 tab po QHS prn insomnia 90 tablet 2   No current facility-administered medications for this visit.    Medication Side Effects: Other: Possible weight gain.  Allergies:  Allergies  Allergen Reactions   Meloxicam Nausea And Vomiting   Penicillins Nausea And Vomiting    Past Medical History:  Diagnosis Date   Anxiety    Back pain    Chronic hip pain    Depression    GERD (gastroesophageal reflux disease)     Family History  Problem Relation Age of Onset   Hyperlipidemia Mother    Depression Mother    Anxiety disorder Mother    Neuropathy Mother    Fibromyalgia Mother    Heart disease Father    Depression Father    Prostate cancer Father    Stomach cancer Father    Bipolar disorder Maternal Grandmother    Depression Maternal Uncle    Colon cancer Neg Hx    Esophageal cancer Neg Hx    Rectal cancer Neg Hx     Social History   Socioeconomic History   Marital status: Married    Spouse name: Not on file   Number of children: 3   Years of education: 2 years of college   Highest education level: Not on file  Occupational History   Occupation: Homemaker  Tobacco Use   Smoking status: Never   Smokeless tobacco: Never  Vaping Use   Vaping Use: Never used  Substance and Sexual Activity   Alcohol use: Not Currently   Drug use: No   Sexual activity: Not on file  Other Topics Concern   Not on file  Social History Narrative   Lives at home with her husband and children.   Right-handed.   1 cup caffeine day.   Social Determinants of Health   Financial Resource Strain: Not on file  Food Insecurity: Not on file  Transportation Needs: Not on file  Physical Activity: Not on file  Stress: Not on file  Social Connections: Not on file  Intimate Partner Violence: Not on file    Past Medical History, Surgical history, Social history, and Family history were reviewed and updated as appropriate.    Please see review of systems for further details on the patient's review from today.   Objective:   Physical Exam:  There were no vitals taken for this visit.  Physical Exam Physical Exam Constitutional:      General: She is not in acute distress. Musculoskeletal:        General: No deformity.  Neurological:     Mental Status: She is alert and oriented to person, place, and time.     Coordination: Coordination normal.  Psychiatric:        Attention and Perception: Attention and perception normal. She does not perceive auditory or visual hallucinations.        Mood and Affect: Mood is not depressed or anxious Affect is  not labile, blunt, angry or inappropriate.        Speech: Speech normal.        Behavior: Behavior normal.        Thought Content: Thought content normal. Thought content is not paranoid or delusional. Thought content does not include homicidal or suicidal ideation. Thought content does not include homicidal or suicidal plan.        Cognition and Memory: Cognition and memory normal.        Judgment: Judgment normal.     Comments: Insight intact  Lab Review:     Component Value Date/Time   NA 134 (L) 01/18/2021 2245   K 3.8 01/18/2021 2245   CL 100 01/18/2021 2245   CO2 26 01/18/2021 2245   GLUCOSE 121 (H) 01/18/2021 2245   BUN 19 01/18/2021 2245   CREATININE 1.03 (H) 01/18/2021 2245   CALCIUM 9.5 01/18/2021 2245   PROT 6.9 01/18/2021 2245   ALBUMIN 4.0 01/18/2021 2245   AST 23 01/18/2021 2245   ALT 22 01/18/2021 2245   ALKPHOS 62 01/18/2021 2245   BILITOT 0.4 01/18/2021 2245   GFRNONAA >60 01/18/2021 2245       Component Value Date/Time   WBC 12.6 (H) 01/18/2021 2245   RBC 4.08 01/18/2021 2245   HGB 13.3 01/18/2021 2245   HCT 38.6 01/18/2021 2245   PLT 306 01/18/2021 2245   MCV 94.6 01/18/2021 2245   MCH 32.6 01/18/2021 2245   MCHC 34.5 01/18/2021 2245   RDW 13.2 01/18/2021 2245   LYMPHSABS 2.1 01/18/2021 2245   MONOABS 1.0 01/18/2021 2245    EOSABS 0.0 01/18/2021 2245   BASOSABS 0.0 01/18/2021 2245    No results found for: POCLITH, LITHIUM   No results found for: PHENYTOIN, PHENOBARB, VALPROATE, CBMZ   .res Assessment: Plan:   Will continue current plan of care since target signs and symptoms are well controlled without any tolerability issues. Continue Abilify 2 mg po qd for depression. Continue Wellbutrin XL 300 mg po qd for depression.  Continue Sertraline 150 mg po qd for anxiety and depression.  Continue Trazodone 50 mg po QHS prn insomnia.  Continue Alprazolam 0.5 mg 1/2-1 tab po BID prn anxiety.  Recommend continuing therapy with Lina Sayre, [facility].  Pt to follow-up in 6 months or sooner if clinically indicated.  Patient advised to contact office with any questions, adverse effects, or acute worsening in signs and symptoms.   Desteny was seen today for follow-up.  Diagnoses and all orders for this visit:  Recurrent major depressive disorder, in partial remission ([place]) -     ARIPiprazole (ABILIFY) 2 MG tablet; Take 1 tablet (2 mg total) by mouth daily. -     buPROPion (WELLBUTRIN XL) 300 MG 24 hr tablet; Take 1 tablet (300 mg total) by mouth daily.  PTSD (post-traumatic stress disorder) -     sertraline (ZOLOFT) 100 MG tablet; Take 1.5 tablets (150 mg total) by mouth daily. -     ALPRAZolam (XANAX) 0.5 MG tablet; Take 1/2-1 tab po BID prn anxiety/panic  Insomnia due to other mental disorder -     traZODone (DESYREL) 50 MG tablet; Take 1/2-1 tab po QHS prn insomnia     Please see After Visit Summary for patient specific instructions.  Future Appointments  Date Time Provider [place]  04/03/2021 12:00 PM Lina Sayre, [facility] CP-CP None  04/17/2021 11:00 AM Lina Sayre, [facility] CP-CP None  05/08/2021 10:00 AM Lina Sayre, [facility] CP-CP None  06/06/2021 10:00 AM  Lina Sayre, [facility] CP-CP None    No orders of the defined types were placed in this encounter.     -------------------------------

## 2021-04-03 ENCOUNTER — Other Ambulatory Visit: Payer: Self-pay

## 2021-04-03 ENCOUNTER — Ambulatory Visit (INDEPENDENT_AMBULATORY_CARE_PROVIDER_SITE_OTHER): Payer: 59 | Admitting: Psychiatry

## 2021-04-03 DIAGNOSIS — F4310 Post-traumatic stress disorder, unspecified: Secondary | ICD-10-CM | POA: Diagnosis not present

## 2021-04-03 DIAGNOSIS — R69 Illness, unspecified: Secondary | ICD-10-CM | POA: Diagnosis not present
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
## 2021-04-17 ENCOUNTER — Other Ambulatory Visit: Payer: Self-pay

## 2021-04-17 ENCOUNTER — Ambulatory Visit (INDEPENDENT_AMBULATORY_CARE_PROVIDER_SITE_OTHER): Payer: 59 | Admitting: Psychiatry

## 2021-04-17 DIAGNOSIS — F4310 Post-traumatic stress disorder, unspecified: Secondary | ICD-10-CM | POA: Diagnosis not present

## 2021-04-17 DIAGNOSIS — R69 Illness, unspecified: Secondary | ICD-10-CM | POA: Diagnosis not present
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
## 2021-04-28 ENCOUNTER — Other Ambulatory Visit: Payer: Self-pay | Admitting: Psychiatry

## 2021-04-28 DIAGNOSIS — F3341 Major depressive disorder, recurrent, in partial remission: Secondary | ICD-10-CM

## 2021-05-08 ENCOUNTER — Ambulatory Visit: Payer: 59 | Admitting: Psychiatry

## 2021-05-11 DIAGNOSIS — R109 Unspecified abdominal pain: Secondary | ICD-10-CM | POA: Diagnosis not present

## 2021-05-11 DIAGNOSIS — G8929 Other chronic pain: Secondary | ICD-10-CM | POA: Diagnosis not present

## 2021-05-11 DIAGNOSIS — M7918 Myalgia, other site: Secondary | ICD-10-CM | POA: Diagnosis not present

## 2021-05-29 DIAGNOSIS — R102 Pelvic and perineal pain: Secondary | ICD-10-CM | POA: Diagnosis not present

## 2021-05-29 DIAGNOSIS — R1032 Left lower quadrant pain: Secondary | ICD-10-CM | POA: Diagnosis not present

## 2021-06-06 ENCOUNTER — Ambulatory Visit (INDEPENDENT_AMBULATORY_CARE_PROVIDER_SITE_OTHER): Payer: 59 | Admitting: Psychiatry

## 2021-06-06 DIAGNOSIS — R69 Illness, unspecified: Secondary | ICD-10-CM | POA: Diagnosis not present

## 2021-06-06 DIAGNOSIS — F4310 Post-traumatic stress disorder, unspecified: Secondary | ICD-10-CM | POA: Diagnosis not present

## 2021-06-06 NOTE — Progress Notes (Signed)
?    [facility] Counselor/Therapist Progress Note ? ?Patient ID: [name], MRN: [id number],   ? ?Date: 06/06/2021 ? ?Time Spent: 56 minutes start time 10:00 AM end time 10:56 AM ? ?Treatment Type: Individual Therapy ? ?Reported Symptoms: pain/health issues, anxiety, nightmares, flashbacks, triggered responses ? ?Mental Status Exam: ? ?Appearance:   Casual and Neat     ?Behavior:  Appropriate  ?Motor:  Normal  ?Speech/Language:   Normal Rate  ?Affect:  Appropriate  ?Mood:  anxious  ?Thought process:  normal  ?Thought content:    WNL  ?Sensory/Perceptual disturbances:    WNL  ?Orientation:  oriented to person, place, time/date, and situation  ?Attention:  Good  ?Concentration:  Good  ?Memory:  WNL  ?Fund of knowledge:   Good  ?Insight:    Good  ?Judgment:   Good  ?Impulse Control:  Good  ? ?Risk Assessment: ?Danger to Self:  No ?Self-injurious Behavior: No ?Danger to Others: No ?Duty to Warn:no ?Physical Aggression / Violence:No  ?Access to Firearms a concern: No  ?Gang Involvement:No  ? ?Subjective: Patient was present for session. She shared she is working with her OBGYN due to female issues and she is also having to call her urologist due to possible kidney stone. Patient shared that her daughter's horse died and that was hard. She shared her friend's husband triggered by some of his behaviors.  She is also preparing for her daughter's graduation and family coming in for the celebration.  Patient did processing set on what had triggered her the most which was a comment by her husband, suds level 6, negative cognition "I am not worth it" felt hurt in her stomach.  Patient was able to reduce suds level to 2.  She was able to recognize that his comment made her think about her grandmother who she did not like and had to cut contact with due to her negativity.  She shared she recognized that her grandmother always made her feel very worthless and not valued.  Encouraged patient to recognize that she can past that  emotion back rather than passing it forward to her children.  Patient was reminded of all the times that she is made her children feel affirmed and heard and encouraged to remind herself that she is letting her grandmother have that toxic stuff and she is not going to carry it any longer.  Patient was also able to recognize times that she has been able to stand up for herself in a healthy manner and that helped her affirm younger parts as well. ? ?Interventions: Cognitive Behavioral Therapy, Solution-Oriented/Positive Psychology, and Eye Movement Desensitization and Reprocessing (EMDR) ? ?Diagnosis: ?  ICD-10-CM   ?1. PTSD (post-traumatic stress disorder)  F43.10   ?  ? ? ?Plan: Patient is to use CBT and coping skills to continue decreasing triggered responses.  Patient is to work on self talk to remind herself that her needs met her and she is enough.  Patient is to exercise to release negative emotions appropriately.  Patient is to continue working on affirming herself that she is enough and that she is doing the right things with her children especially as her family is coming in for the celebration.   Patient is to take medication as directed.  Patient is to continue working with providers on medical issues.   ?Long term goal: Develop and implement effective coping skills that allow for carrying out normal responsibilities and participating relationships and social activities. ?Short term goal: Practice, implement  relaxation training as a coping  Mechanism for tension, panic, stress, anger and anxiety ? ?Lina Sayre, [facility] ? ? ? ? ? ? ? ? ? ? ? ? ? ? ? ? ? ? ?

## 2021-06-16 ENCOUNTER — Encounter: Payer: Self-pay | Admitting: Psychiatry

## 2021-06-16 ENCOUNTER — Ambulatory Visit: Payer: 59 | Admitting: Psychiatry

## 2021-06-16 DIAGNOSIS — F99 Mental disorder, not otherwise specified: Secondary | ICD-10-CM

## 2021-06-16 DIAGNOSIS — F5105 Insomnia due to other mental disorder: Secondary | ICD-10-CM | POA: Diagnosis not present

## 2021-06-16 DIAGNOSIS — F3341 Major depressive disorder, recurrent, in partial remission: Secondary | ICD-10-CM

## 2021-06-16 DIAGNOSIS — F4310 Post-traumatic stress disorder, unspecified: Secondary | ICD-10-CM | POA: Diagnosis not present

## 2021-06-16 DIAGNOSIS — R69 Illness, unspecified: Secondary | ICD-10-CM | POA: Diagnosis not present

## 2021-06-16 MED ORDER — BUPROPION HCL ER (XL) 300 MG PO TB24: 300.0000 mg | ORAL_TABLET | Freq: Every day | ORAL | 2 refills | Status: DC

## 2021-06-16 MED ORDER — TRAZODONE HCL 50 MG PO TABS: ORAL_TABLET | ORAL | 2 refills | Status: DC

## 2021-06-16 MED ORDER — BREXPIPRAZOLE 1 MG PO TABS
1.0000 mg | ORAL_TABLET | Freq: Every day | ORAL | 2 refills | Status: DC
Start: 2021-06-16 — End: 2021-06-30

## 2021-06-16 MED ORDER — SERTRALINE HCL 100 MG PO TABS: 150.0000 mg | ORAL_TABLET | Freq: Every day | ORAL | 1 refills | Status: DC

## 2021-06-16 NOTE — Progress Notes (Signed)
[name] [id number] [birth year] 46 y.o.  Subjective:   Patient ID:  [name] is a 46 y.o. (DOB Jun 10, 1975) female.  Chief Complaint: No chief complaint on file.   HPI [name] presents to the office today for follow-up of anxiety, depression, and insomnia.   She reports that Gabapentin seemed to cause her weight gain and reports that her weight has been gradually coming down since she stopped Gabapentin. She reports that she has notices some possible slight weight gain with Abilify. She reports that her appetite has been less with Edward Qualia.   She reports that her anxiety has been "fine" overall. She reports some situational anxiety and "I think I am handling it better than I used to." Denies any recent panic attacks. She reports that PTSD s/s have been manageable. Denies any recent nightmares. She reports that she has had a "little bit" of depression in response to pain with possible endometriosis, ovarian cyst, and kidney stone. She reports adequate sleep. Energy and motivation have been ok. Denies difficulty with concentration.  Denies SI.   Her oldest child is graduating from high school and her in-laws are coming to visit. Daughter will be staying at home and going to [facility].  Past Psychiatric Medication Trials: Effexor XR Wellbutrin- Causes GI upset in the morning. Prefers to take in the morning. Trazodone- Reports that she has taken it intermittently. Reports that 25 mg is ineffective. Sertraline Doxazosin- helpful for nightmares. Noticed some fatigue Prazosin- Not effective and caused Silenor- Reports that 3 mg was effective but cost prohibitive. Doxepin- Excessive somnolence Gabapentin- caused brain fog   Flowsheet Row ED from 01/18/2021 in [facility] No Risk        Review of Systems:  Review of Systems  Gastrointestinal:  Positive for abdominal pain.  Genitourinary:  Positive for hematuria and pelvic  pain.       Has ovarian cyst.  Has a kidney stone.  Musculoskeletal:  Negative for gait problem.  Neurological:  Negative for tremors.  Psychiatric/Behavioral:         Please refer to HPI   Medications: I have reviewed the patient's current medications.  Current Outpatient Medications  Medication Sig Dispense Refill  . ALPRAZolam (XANAX) 0.5 MG tablet Take 1/2-1 tab po BID prn anxiety/panic 30 tablet 2  . APPLE CIDER VINEGAR PO Take by mouth.    . ARIPiprazole (ABILIFY) 2 MG tablet Take 1 tablet (2 mg total) by mouth daily. 30 tablet 5  . Ascorbic Acid (VITAMIN C) 100 MG tablet Take 100 mg by mouth daily. (Patient not taking: Reported on 03/22/2021)    . buPROPion (WELLBUTRIN XL) 300 MG 24 hr tablet Take 1 tablet (300 mg total) by mouth daily. 90 tablet 3  . calcium carbonate (OS-CAL - DOSED IN MG OF ELEMENTAL CALCIUM) 1250 (500 Ca) MG tablet Take 1 tablet by mouth.    Marland Kitchen MAGNESIUM PO Take by mouth.    . Multiple Vitamins-Minerals (MULTIVITAMIN PO) Take by mouth daily.    Marland Kitchen omeprazole (PRILOSEC OTC) 20 MG tablet Take 20 mg by mouth daily as needed.    . Probiotic Product (PROBIOTIC PO) Take by mouth.    . sertraline (ZOLOFT) 100 MG tablet Take 1.5 tablets (150 mg total) by mouth daily. 135 tablet 1  . traZODone (DESYREL) 50 MG tablet Take 1/2-1 tab po QHS prn insomnia 90 tablet 2   No current facility-administered medications for this visit.    Medication Side Effects: None  Allergies:  Allergies  Allergen Reactions  . Meloxicam Nausea And Vomiting  . Penicillins Nausea And Vomiting    Past Medical History:  Diagnosis Date  . Anxiety   . Back pain   . Chronic hip pain   . Depression   . GERD (gastroesophageal reflux disease)     Past Medical History, Surgical history, Social history, and Family history were reviewed and updated as appropriate.   Please see review of systems for further details on the patient's review from today.   Objective:   Physical Exam:  There  were no vitals taken for this visit.  Physical Exam  Lab Review:     Component Value Date/Time   NA 134 (L) 01/18/2021 2245   K 3.8 01/18/2021 2245   CL 100 01/18/2021 2245   CO2 26 01/18/2021 2245   GLUCOSE 121 (H) 01/18/2021 2245   BUN 19 01/18/2021 2245   CREATININE 1.03 (H) 01/18/2021 2245   CALCIUM 9.5 01/18/2021 2245   PROT 6.9 01/18/2021 2245   ALBUMIN 4.0 01/18/2021 2245   AST 23 01/18/2021 2245   ALT 22 01/18/2021 2245   ALKPHOS 62 01/18/2021 2245   BILITOT 0.4 01/18/2021 2245   GFRNONAA >60 01/18/2021 2245       Component Value Date/Time   WBC 12.6 (H) 01/18/2021 2245   RBC 4.08 01/18/2021 2245   HGB 13.3 01/18/2021 2245   HCT 38.6 01/18/2021 2245   PLT 306 01/18/2021 2245   MCV 94.6 01/18/2021 2245   MCH 32.6 01/18/2021 2245   MCHC 34.5 01/18/2021 2245   RDW 13.2 01/18/2021 2245   LYMPHSABS 2.1 01/18/2021 2245   MONOABS 1.0 01/18/2021 2245   EOSABS 0.0 01/18/2021 2245   BASOSABS 0.0 01/18/2021 2245    No results found for: POCLITH, LITHIUM   No results found for: PHENYTOIN, PHENOBARB, VALPROATE, CBMZ   .res Assessment: Plan:    There are no diagnoses linked to this encounter.   Please see After Visit Summary for patient specific instructions.  Future Appointments  Date Time Provider [place]  06/21/2021  9:00 AM Lina Sayre, [facility] CP-CP None  08/03/2021  9:00 AM Lina Sayre, [facility] CP-CP None  08/23/2021 10:00 AM Lina Sayre, [facility] CP-CP None  09/11/2021 11:00 AM Lina Sayre, [facility] CP-CP None    No orders of the defined types were placed in this encounter.   -------------------------------

## 2021-06-21 ENCOUNTER — Ambulatory Visit: Payer: 59 | Admitting: Psychiatry

## 2021-06-21 DIAGNOSIS — F4310 Post-traumatic stress disorder, unspecified: Secondary | ICD-10-CM

## 2021-06-21 DIAGNOSIS — R69 Illness, unspecified: Secondary | ICD-10-CM | POA: Diagnosis not present

## 2021-06-21 NOTE — Progress Notes (Signed)
      [facility] Counselor/Therapist Progress Note  Patient ID: [name], MRN: [id number],    Date: 06/21/2021  Time Spent: 51 minutes start time 9:07 AM and time 9:58 AM  Treatment Type: Individual Therapy  Reported Symptoms: anxiety, triggered responses,sleep issues, nightmares, pain issue  Mental Status Exam:  Appearance:   Well Groomed     Behavior:  Appropriate  Motor:  Normal  Speech/Language:   Normal Rate  Affect:  Appropriate  Mood:  anxious  Thought process:  normal  Thought content:    WNL  Sensory/Perceptual disturbances:    WNL  Orientation:  oriented to person, place, time/date, and situation  Attention:  Good  Concentration:  Good  Memory:  WNL  Fund of knowledge:   Good  Insight:    Good  Judgment:   Good  Impulse Control:  Good   Risk Assessment: Danger to Self:  No Self-injurious Behavior: No Danger to Others: No Duty to Warn:no Physical Aggression / Violence:No  Access to Firearms a concern: No  Gang Involvement:No   Subjective: Patient was present for session.  She shared she has been having anxiety due to all of her family coming in for daughter's graduation soon.  She shared that she she had a shot in her stomach and that has helped her some with pain.  She shared she was told that the wall of her stomach is getting very tight.  She is also having pain with her cysts in her left ovary.  Patient stated that the biggest issue she is having currently is knowing her mother-in-law is coming into town and her parents are going to be there and she is concerned there will be lots of negativity and difficult conversation.  Patient did processing set on husband's mom's negativity, suds level 6, negative cognition "I am not good enough" felt hurt and sadness in her stomach.  Patient was able to reduce suds level to 2.  She was able to recognize that she cannot control other people's choices and behaviors and she needs to let go of what ever happens with his  mother.  Encouraged patient to focus on the celebration of her daughter and not worry about other interactions.  Interventions: Cognitive Behavioral Therapy, Solution-Oriented/Positive Psychology, Eye Movement Desensitization and Reprocessing (EMDR), and Insight-Oriented  Diagnosis:   ICD-10-CM   1. PTSD (post-traumatic stress disorder)  F43.10       Plan: Patient is to use CBT and coping skills to continue decreasing triggered responses.  Patient is to work on self talk to remind herself that her she is enough.  Patient is to exercise to release negative emotions appropriately.  Patient is to focus on the positive of enjoying her daughter during the family celebration.   Patient is to take medication as directed.  Patient is to continue working with providers on medical issues.   Long term goal: Develop and implement effective coping skills that allow for carrying out normal responsibilities and participating relationships and social activities. Short term goal: Practice, implement relaxation training as a coping  Mechanism for tension, panic, stress, anger and anxiety  [name], [facility]

## 2021-06-30 ENCOUNTER — Telehealth: Payer: Self-pay | Admitting: Psychiatry

## 2021-06-30 DIAGNOSIS — F3341 Major depressive disorder, recurrent, in partial remission: Secondary | ICD-10-CM

## 2021-06-30 MED ORDER — BREXPIPRAZOLE 2 MG PO TABS: ORAL_TABLET | ORAL | 1 refills | Status: DC

## 2021-06-30 NOTE — Telephone Encounter (Signed)
Canceled as requested.

## 2021-06-30 NOTE — Telephone Encounter (Signed)
Please contact [facility] and cancel scripts on file for Rexulti 1 mg.

## 2021-06-30 NOTE — Telephone Encounter (Signed)
Contacted pt to follow-up after she started Rexulti. She reports, "I like it better" than Abilify. Discussed savings card and high deductible plan. Pt requested that provider speak with her husband since he typically handles insurance for their household. Discussed information with husband. Will send script for Rexulti 2 mg for her to maximize savings. Discussed that she can then take 1/2 of a 2 mg tablet. Husband reports that he will share this information with patient. Advised them to call back with any questions.

## 2021-08-02 DIAGNOSIS — R109 Unspecified abdominal pain: Secondary | ICD-10-CM | POA: Diagnosis not present

## 2021-08-03 ENCOUNTER — Ambulatory Visit (INDEPENDENT_AMBULATORY_CARE_PROVIDER_SITE_OTHER): Payer: 59 | Admitting: Psychiatry

## 2021-08-03 DIAGNOSIS — R69 Illness, unspecified: Secondary | ICD-10-CM | POA: Diagnosis not present

## 2021-08-03 DIAGNOSIS — R8279 Other abnormal findings on microbiological examination of urine: Secondary | ICD-10-CM | POA: Diagnosis not present

## 2021-08-03 DIAGNOSIS — F4310 Post-traumatic stress disorder, unspecified: Secondary | ICD-10-CM | POA: Diagnosis not present

## 2021-08-03 DIAGNOSIS — N200 Calculus of kidney: Secondary | ICD-10-CM | POA: Diagnosis not present

## 2021-08-03 NOTE — Progress Notes (Signed)
      [facility] Counselor/Therapist Progress Note  Patient ID: [name], MRN: [id number],    Date: 08/03/2021  Time Spent: 50 minutes start time 9:08 AM end time 9:58 AM  Treatment Type: Individual Therapy  Reported Symptoms: nightmares, sleep issues, anxiety, sadness, stomach pain  Mental Status Exam:  Appearance:   Well Groomed     Behavior:  Appropriate  Motor:  Normal  Speech/Language:   Normal Rate  Affect:  Appropriate  Mood:  normal  Thought process:  normal  Thought content:    WNL  Sensory/Perceptual disturbances:    WNL  Orientation:  oriented to person, place, time/date, and situation  Attention:  Good  Concentration:  Good  Memory:  WNL  Fund of knowledge:   Good  Insight:    Good  Judgment:   Good  Impulse Control:  Good   Risk Assessment: Danger to Self:  No Self-injurious Behavior: No Danger to Others: No Duty to Warn:no Physical Aggression / Violence:No  Access to Firearms a concern: No  Gang Involvement:No   Subjective: Patient was present for session. She shared that things went well with her  daughter's graduation. She shared her mother is staying with her currently.  She shared that she started having nightmares 3 weeks ago. She reported that a horse that she allowed to stay somewhere else was either sold or died and that has been upsetting her. She shared she is realizing that she got triggered due to the person that had her give the horse away didn't listen to her.  Patient did processing set on not being listened to about the horse, suds level 7, negative cognition "nobody listens to me", felt guilt in her stomach.  Patient was able to reduce suds level to 3.  At the end of session she even shared that the feeling in her stomach was better.  Patient acknowledged that she is concerned that some of her stomach issues are caused by stress and she is going to acupuncture to try and help with that situation.  Interventions: Eye Movement  Desensitization and Reprocessing (EMDR) and Insight-Oriented  Diagnosis:   ICD-10-CM   1. PTSD (post-traumatic stress disorder)  F43.10       Plan:  Patient is to use CBT and coping skills to continue decreasing triggered responses.  Patient is to work on self talk to remind herself that her she is enough.  Patient is to exercise to release negative emotions appropriately.  Patient is to f use acupuncture as a way to reduce stress.   Patient is to take medication as directed.  Patient is to continue working with providers on medical issues.   Long term goal: Develop and implement effective coping skills that allow for carrying out normal responsibilities and participating relationships and social activities. Short term goal: Practice, implement relaxation training as a coping  Mechanism for tension, panic, stress, anger and anxiety  Stevphen Meuse, [facility]

## 2021-08-23 ENCOUNTER — Ambulatory Visit: Payer: 59 | Admitting: Psychiatry

## 2021-08-23 ENCOUNTER — Encounter: Payer: Self-pay | Admitting: Psychiatry

## 2021-08-23 DIAGNOSIS — F3341 Major depressive disorder, recurrent, in partial remission: Secondary | ICD-10-CM | POA: Diagnosis not present

## 2021-08-23 DIAGNOSIS — R69 Illness, unspecified: Secondary | ICD-10-CM | POA: Diagnosis not present

## 2021-08-23 DIAGNOSIS — F4310 Post-traumatic stress disorder, unspecified: Secondary | ICD-10-CM

## 2021-08-23 DIAGNOSIS — F99 Mental disorder, not otherwise specified: Secondary | ICD-10-CM

## 2021-08-23 DIAGNOSIS — F5105 Insomnia due to other mental disorder: Secondary | ICD-10-CM

## 2021-08-23 MED ORDER — ALPRAZOLAM 0.5 MG PO TABS: ORAL_TABLET | ORAL | 2 refills | Status: DC

## 2021-08-23 MED ORDER — ARIPIPRAZOLE 5 MG PO TABS: ORAL_TABLET | ORAL | 2 refills | Status: DC

## 2021-08-23 MED ORDER — BELSOMRA 10 MG PO TABS: 10.0000 mg | ORAL_TABLET | Freq: Every day | ORAL | 0 refills | Status: DC

## 2021-08-23 MED ORDER — SERTRALINE HCL 100 MG PO TABS: 150.0000 mg | ORAL_TABLET | Freq: Every day | ORAL | 1 refills | Status: DC

## 2021-08-23 MED ORDER — BELSOMRA 15 MG PO TABS: 15.0000 mg | ORAL_TABLET | Freq: Every day | ORAL | 0 refills | Status: DC

## 2021-08-23 NOTE — Progress Notes (Signed)
[name] [id number] 1975-08-05 46 y.o.  Subjective:   Patient ID:  [name] is a 46 y.o. (DOB 1975/11/29) female.  Chief Complaint:  Chief Complaint  Patient presents with   Depression   Sleeping Problem    HPI [name] presents to the office today for follow-up of anxiety, depression, and insomnia.   She reports that [name] seemed to work better for her than [name]. Se reports that she "got off track" with [name] when samples ran out about 2 weeks ago. She felt like [name] may have given her a boost in energy and motivation.   She reports that her mood, energy, motivation have been lower over the last few weeks since running out of [name]. She has been taking [name] at night since it is easier to remember at night. She will try taking it in the morning. She has been having nightmares and not sleeping as well. She is not able to recall content of nightmares. Denies any recent PTSD triggers. She has been eating in the middle of the night and thinks that it may be a form of self-soothing. She reports that her appetite has been the same overall with some decrease in appetite with [place]. Concentration and focus has been ok. Denies SI.   Oldest daughter's graduation went well. Other daughter is 87 and youngest is starting high school. Mother is visiting her from [place].   Past Psychiatric Medication Trials: Effexor XR [name]- Causes GI upset in the morning. Prefers to take in the morning. [name]- Reports that she has taken it intermittently. Reports that 25 mg is ineffective. [name] Doxazosin- helpful for nightmares. Noticed some fatigue Prazosin- Not effective and caused Silenor- Reports that 3 mg was effective but cost prohibitive. Doxepin- Excessive somnolence Gabapentin- caused brain fog   Flowsheet Row ED from 01/18/2021 in [facility]  C-SSRS RISK CATEGORY No Risk        Review of Systems:  Review of Systems   Genitourinary:  Positive for pelvic pain.       Menstrual irregularlies  Musculoskeletal:  Negative for gait problem.  Neurological:  Negative for tremors.  Psychiatric/Behavioral:         Please refer to HPI    Medications: I have reviewed the patient's current medications.  Current Outpatient Medications  Medication Sig Dispense Refill   APPLE CIDER VINEGAR PO Take by mouth.     ARIPiprazole ([name]) 5 MG tablet Take 1/2-1 tab po qd 30 tablet 2   buPROPion ([name] XL) 300 MG 24 hr tablet Take 1 tablet (300 mg total) by mouth daily. 90 tablet 2   Elagolix Sodium ([name]) 150 MG TABS [name] 150 mg tablet  TAKE 1 TABLET BY MOUTH EVERY DAY     MAGNESIUM PO Take by mouth.     Multiple Vitamins-Minerals (MULTIVITAMIN PO) Take by mouth daily.     Omega-3 Fatty Acids (FISH OIL) 1000 MG CAPS Take by mouth.     omeprazole (PRILOSEC OTC) 20 MG tablet Take 20 mg by mouth daily as needed.     Probiotic Product (PROBIOTIC PO) Take by mouth.     Suvorexant ([name]) 10 MG TABS Take 10 mg by mouth at bedtime. 10 tablet 0   Suvorexant ([name]) 15 MG TABS Take 15 mg by mouth at bedtime. 10 tablet 0   [name] ([name]) 50 MG tablet Take 1/2-1 tab po QHS prn insomnia 90 tablet 2   [name] ([name]) 0.5 MG tablet Take 1/2-1 tab po BID prn anxiety/panic  30 tablet 2   [name] ([name]) 100 MG tablet Take 1.5 tablets (150 mg total) by mouth daily. 135 tablet 1   No current facility-administered medications for this visit.    Medication Side Effects: None  Allergies:  Allergies  Allergen Reactions   Meloxicam Nausea And Vomiting   Penicillins Nausea And Vomiting    Past Medical History:  Diagnosis Date   Anxiety    Back pain    Chronic hip pain    Depression    GERD (gastroesophageal reflux disease)     Past Medical History, Surgical history, Social history, and Family history were reviewed and updated as appropriate.   Please see review of systems for further details  on the patient's review from today.   Objective:   Physical Exam:  There were no vitals taken for this visit.  Physical Exam Constitutional:      General: She is not in acute distress. Musculoskeletal:        General: No deformity.  Neurological:     Mental Status: She is alert and oriented to person, place, and time.     Coordination: Coordination normal.  Psychiatric:        Attention and Perception: Attention and perception normal. She does not perceive auditory or visual hallucinations.        Mood and Affect: Affect is not labile, blunt, angry or inappropriate.        Speech: Speech normal.        Behavior: Behavior normal.        Thought Content: Thought content normal. Thought content is not paranoid or delusional. Thought content does not include homicidal or suicidal ideation. Thought content does not include homicidal or suicidal plan.        Cognition and Memory: Cognition and memory normal.        Judgment: Judgment normal.     Comments: Insight intact Mood is mildly depressed     Lab Review:     Component Value Date/Time   NA 134 (L) 01/18/2021 2245   K 3.8 01/18/2021 2245   CL 100 01/18/2021 2245   CO2 26 01/18/2021 2245   GLUCOSE 121 (H) 01/18/2021 2245   BUN 19 01/18/2021 2245   CREATININE 1.03 (H) 01/18/2021 2245   CALCIUM 9.5 01/18/2021 2245   PROT 6.9 01/18/2021 2245   ALBUMIN 4.0 01/18/2021 2245   AST 23 01/18/2021 2245   ALT 22 01/18/2021 2245   ALKPHOS 62 01/18/2021 2245   BILITOT 0.4 01/18/2021 2245   GFRNONAA >60 01/18/2021 2245       Component Value Date/Time   WBC 12.6 (H) 01/18/2021 2245   RBC 4.08 01/18/2021 2245   HGB 13.3 01/18/2021 2245   HCT 38.6 01/18/2021 2245   PLT 306 01/18/2021 2245   MCV 94.6 01/18/2021 2245   MCH 32.6 01/18/2021 2245   MCHC 34.5 01/18/2021 2245   RDW 13.2 01/18/2021 2245   LYMPHSABS 2.1 01/18/2021 2245   MONOABS 1.0 01/18/2021 2245   EOSABS 0.0 01/18/2021 2245   BASOSABS 0.0 01/18/2021 2245    No  results found for: "POCLITH", "LITHIUM"   No results found for: "PHENYTOIN", "PHENOBARB", "VALPROATE", "CBMZ"   .res Assessment: Plan:    Pt seen for 30 minutes and time spent discussing treatment option with either [name] or [name]. She reports that [name] samples seemed to be effective for her mood, however she is concerned about ongoing treatment due to cost since she has a high deductible plan. She reports that  she would prefer to resume [name]. She reports that she initially experienced a boost in energy and motivation with [name] 2 mg and then this seemed to fade and asks about possible higher dose. Recommended starting with [name] 5 mg 1/2 tablet and then increasing to 1 tab if needed.  Discussed treatment options for insomnia to include orexin antagonists, such as [name] or [name]. Pt reports that she would like to try samples of [name] to determine if this is helpful for her insomnia. Will start [name] samples 10 mg po QHS for 9 nights, then increase to 15 mg po QHS as tolerated. Advised pt to contact office if she would like script sent or 10-day free trial offer.  Will continue [name] XL 300 mg po qd for depression.  Continue [name] 150 mg po qd for anxiety and depression.  Continue [name] 50 mg 1/2-1 tab po QHS prn insomnia.  Continue [name] 0.5 mg 1/2-1 tab po BID prn anxiety/panic. Recommend continuing therapy with [name], [facility].  Patient advised to contact office with any questions, adverse effects, or acute worsening in signs and symptoms. Pt to follow-up in 3 months or sooner if clinically indicated.    [name] was seen today for depression and sleeping problem.  Diagnoses and all orders for this visit:  Recurrent major depressive disorder, in partial remission (HCC) -     ARIPiprazole ([name]) 5 MG tablet; Take 1/2-1 tab po qd  PTSD (post-traumatic stress disorder) -     [name] ([name]) 0.5 MG tablet; Take 1/2-1 tab po BID prn  anxiety/panic -     [name] ([name]) 100 MG tablet; Take 1.5 tablets (150 mg total) by mouth daily.  Insomnia due to other mental disorder -     Suvorexant ([name]) 15 MG TABS; Take 15 mg by mouth at bedtime. -     Suvorexant ([name]) 10 MG TABS; Take 10 mg by mouth at bedtime.     Please see After Visit Summary for patient specific instructions.  Future Appointments  Date Time Provider Department Center  09/11/2021 11:00 AM [name], [facility] CP-CP None  09/25/2021 11:00 AM [name], [facility] CP-CP None  10/09/2021 11:00 AM [name], [facility] CP-CP None  11/01/2021  9:00 AM [name], [facility] CP-CP None  11/14/2021 11:00 AM [name], [facility] CP-CP None    No orders of the defined types were placed in this encounter.   -------------------------------

## 2021-08-23 NOTE — Progress Notes (Signed)
[facility] Counselor/Therapist Progress Note  Patient ID: [name], MRN: [id number],    Date: 08/23/2021  Time Spent: 51 minutes start time 10:07 AM end time 10:58 AM  Treatment Type: Individual Therapy  Reported Symptoms: anxiety, sadness, triggered responses, nightmares, fatigue  Mental Status Exam:  Appearance:   Well Groomed     Behavior:  Appropriate  Motor:  Normal  Speech/Language:   Normal Rate  Affect:  Appropriate  Mood:  anxious  Thought process:  normal  Thought content:    WNL  Sensory/Perceptual disturbances:    WNL  Orientation:  oriented to person, place, time/date, and situation  Attention:  Good  Concentration:  Good  Memory:  WNL  Fund of knowledge:   Good  Insight:    Good  Judgment:   Good  Impulse Control:  Good   Risk Assessment: Danger to Self:  No Self-injurious Behavior: No Danger to Others: No Duty to Warn:no Physical Aggression / Violence:No  Access to Firearms a concern: No  Gang Involvement:No   Subjective: Patient was present for session. Patient reported that she went on vacation and it went okay but there were triggered moments. Patient has not had a decrease in nightmares which is progress.  She went on to share she is continuing to have the night eating and she is not sure what that is about.  Encouraged patient to think through if she had any memory of nightmares or what was surfacing.  She was able to recognize that she has a repetitive nightmare of losing all of her teeth and another nightmare of not being able to get back to [place] after being back home in [place].  Had patient think through what she feels her brain is trying to work on and discussed different things that could be surfacing with the things coming up in her dreams.  Patient was able to realize she is going through a lot of transition and she is very sad about the potential loss of her grandmother who is declining very rapidly.  She is also noticed her  mother's own health is declining which is disturbing to her as well.  Discussed the importance of focusing on her self-care and trying to take time to release those emotions appropriately were discussed with patient.  Also encouraged patient to listen to some brain spotting bilateral music or doing a brain dump journaling exercises before bedtime to try and get some of the negative out of her brain to see if that helps with her sleep.  Interventions: Solution-Oriented/Positive Psychology and Insight-Oriented  Diagnosis:   ICD-10-CM   1. PTSD (post-traumatic stress disorder)  F43.10       Plan: Patient is to use CBT and coping skills to continue decreasing triggered responses.  Patient is to work on listening some brain spotting bilateral music and doing a 3-minute journaling exercise prior to bedtime to see if that helps decrease nightmares.  Patient is to work on self talk to remind herself that her she is enough.  Patient is to exercise to release negative emotions appropriately.  Patient is to use acupuncture as a way to reduce stress.   Patient is to take medication as directed.  Patient is to continue working with providers on medical issues.   Long term goal: Develop and implement effective coping skills that allow for carrying out normal responsibilities and participating relationships and social activities. Short term goal: Practice, implement relaxation training as a coping  Mechanism for  tension, panic, stress, anger and anxiety  Stevphen Meuse, [facility]

## 2021-09-05 DIAGNOSIS — R109 Unspecified abdominal pain: Secondary | ICD-10-CM | POA: Diagnosis not present

## 2021-09-11 ENCOUNTER — Ambulatory Visit: Payer: 59 | Admitting: Psychiatry

## 2021-09-11 DIAGNOSIS — F4310 Post-traumatic stress disorder, unspecified: Secondary | ICD-10-CM

## 2021-09-11 DIAGNOSIS — R69 Illness, unspecified: Secondary | ICD-10-CM | POA: Diagnosis not present

## 2021-09-11 NOTE — Progress Notes (Signed)
      [facility] Counselor/Therapist Progress Note  Patient ID: Milena Liggett, MRN: [id number],    Date: 09/11/2021  Time Spent: 57 minutes start time 11:06 a.m. time 12:03 PM  Treatment Type: Individual Therapy  Reported Symptoms: nightmares, sleep issues, anxiety, sleep walking/ eating, chronic pain  Mental Status Exam:  Appearance:   Casual and Neat     Behavior:  Appropriate  Motor:  Normal  Speech/Language:   Normal Rate  Affect:  Appropriate  Mood:  anxious  Thought process:  normal  Thought content:    WNL  Sensory/Perceptual disturbances:    WNL  Orientation:  oriented to person, place, time/date, and situation  Attention:  Good  Concentration:  Good  Memory:  WNL  Fund of knowledge:   Good  Insight:    Good  Judgment:   Good  Impulse Control:  Good   Risk Assessment: Danger to Self:  No Self-injurious Behavior: No Danger to Others: No Duty to Warn:no Physical Aggression / Violence:No  Access to Firearms a concern: No  Gang Involvement:No   Subjective: Patient was present for session.  She shared she is having nightmares and night eating and she is realizing it has a lot with anxiety. She shared her husband has been trying to set traps so she wakes up when she sleep walks.  Did processing set on nightmares during leaving or cheating, suds level 7, negative cognition "I am too hard to deal with" felt sadness in her stomach.  Patient was able to reduce suds level to 1 and resolve set.  She was able to recognize that she was getting triggered from past relationships and that her husband is not like the men from her past so she is not going to be left.  Patient was encouraged to affirm herself regularly as she goes through the week.  Interventions: Eye Movement Desensitization and Reprocessing (EMDR), Insight-Oriented, and BS.  Diagnosis:   ICD-10-CM   1. PTSD (post-traumatic stress disorder)  F43.10       Plan: Patient is to use CBT and coping skills to  continue decreasing triggered responses.  Patient is to work on listening some brain spotting bilateral music and doing a 3-minute journaling exercise prior to bedtime to see if that helps decrease nightmares.  Patient is to work on self talk to remind herself that her she is enough.  Patient is to exercise to release negative emotions appropriately.  Patient is to use acupuncture as a way to reduce stress.   Patient is to take medication as directed.  Patient is to continue working with providers on medical issues.   Long term goal: Develop and implement effective coping skills that allow for carrying out normal responsibilities and participating relationships and social activities. Short term goal: Practice, implement relaxation training as a coping  Mechanism for tension, panic, stress, anger and anxiety  Stevphen Meuse, [facility]

## 2021-09-22 DIAGNOSIS — N941 Unspecified dyspareunia: Secondary | ICD-10-CM | POA: Diagnosis not present

## 2021-09-22 DIAGNOSIS — Z1389 Encounter for screening for other disorder: Secondary | ICD-10-CM | POA: Diagnosis not present

## 2021-09-22 DIAGNOSIS — N946 Dysmenorrhea, unspecified: Secondary | ICD-10-CM | POA: Diagnosis not present

## 2021-09-22 DIAGNOSIS — Z01419 Encounter for gynecological examination (general) (routine) without abnormal findings: Secondary | ICD-10-CM | POA: Diagnosis not present

## 2021-09-22 DIAGNOSIS — Z130 Encounter for screening for diseases of the blood and blood-forming organs and certain disorders involving the immune mechanism: Secondary | ICD-10-CM | POA: Diagnosis not present

## 2021-09-25 ENCOUNTER — Ambulatory Visit: Payer: 59 | Admitting: Psychiatry

## 2021-09-25 DIAGNOSIS — F4310 Post-traumatic stress disorder, unspecified: Secondary | ICD-10-CM

## 2021-09-25 DIAGNOSIS — R69 Illness, unspecified: Secondary | ICD-10-CM | POA: Diagnosis not present

## 2021-09-25 NOTE — Progress Notes (Signed)
[facility] Counselor/Therapist Progress Note  Patient ID: [name], MRN: [id number],    Date: 09/25/2021  Time Spent: 54 minutes start time 11:08 AM end time 12:02 PM  Treatment Type: Individual Therapy  Reported Symptoms: anxiety,triggered responses, sadness, sleep issues  Mental Status Exam:  Appearance:   Well Groomed     Behavior:  Appropriate  Motor:  Normal  Speech/Language:   Normal Rate  Affect:  Appropriate  Mood:  anxious  Thought process:  normal  Thought content:    WNL  Sensory/Perceptual disturbances:    WNL  Orientation:  oriented to person, place, time/date, and situation  Attention:  Good  Concentration:  Good  Memory:  WNL  Fund of knowledge:   Good  Insight:    Good  Judgment:   Good  Impulse Control:  Good   Risk Assessment: Danger to Self:  No Self-injurious Behavior: No Danger to Others: No Duty to Warn:no Physical Aggression / Violence:No  Access to Firearms a concern: No  Gang Involvement:No   Subjective: Patient was present for session.  She shared that her kids made it to school but it was hard since her youngest started high school.  She stated her husband has finally figured out a trap to wake her up when she sleep walks.  She stated she is writing down the dreams and that has helped improve her sleep issues. She shared she has started exercising in the mornings which was progress for her. She shared she has endometriosis and her gynecologist recommended a hysterectomy so they are hoping to get that on the schedule soon.  Patient reported that it was helpful for she and her husband to learn that was why she was having so many issues with sex and the pain it was not a PTSD response as she had thought.Dad had emergency hernia surgery this weekend which was also concerning for patient.  She shared that unfortunately she had an issue with her in-laws that had to be addressed and was causing more stress.  Patient explained that her mother  and father-in-law had written letters concerning an issue with her daughter and her daughter's friend.  Patient reported that they are not sure what they were saying and what they were concerned about because the friend has been a part of her life for many years and it seems to be a healthy relationship for her daughter.  Patient stated she no longer wants to go to her in-laws for the holidays and is trying to figure out how to communicate concerns with her husband and be supportive of him when she wants no contact.  Allowed time for patient to think through her feelings and what she wanted to express to her husband.  Encouraged her to talk to him about the situation and how they may be able to resolve it if he does want her to go to his family's over the Christmas holidays.  Patient reported feeling positive about plan from session.  Interventions: Solution-Oriented/Positive Psychology and Insight-Oriented  Diagnosis:   ICD-10-CM   1. PTSD (post-traumatic stress disorder)  F43.10       Plan: Patient is to use CBT and coping skills to continue decreasing triggered responses.  Patient is to follow plans from session to communicate with her husband about her concerns and plans regarding the letter that his parents sent them.  Patient is to work on listening some brain spotting bilateral music and doing a 3-minute journaling exercise prior to bedtime  to see if that helps decrease nightmares.  Patient is to work on self talk to remind herself that her she is enough.  Patient is to exercise to release negative emotions appropriately.  Patient is to use acupuncture as a way to reduce stress.   Patient is to take medication as directed.  Patient is to continue working with providers on medical issues.   Long term goal: Develop and implement effective coping skills that allow for carrying out normal responsibilities and participating relationships and social activities. Short term goal: Practice, implement  relaxation training as a coping  Mechanism for tension, panic, stress, anger and anxiety  Stevphen Meuse, [facility]

## 2021-10-03 DIAGNOSIS — Z1231 Encounter for screening mammogram for malignant neoplasm of breast: Secondary | ICD-10-CM | POA: Diagnosis not present

## 2021-10-09 ENCOUNTER — Ambulatory Visit: Payer: 59 | Admitting: Psychiatry

## 2021-11-01 ENCOUNTER — Ambulatory Visit (INDEPENDENT_AMBULATORY_CARE_PROVIDER_SITE_OTHER): Payer: 59 | Admitting: Psychiatry

## 2021-11-01 DIAGNOSIS — R69 Illness, unspecified: Secondary | ICD-10-CM | POA: Diagnosis not present

## 2021-11-01 DIAGNOSIS — F4310 Post-traumatic stress disorder, unspecified: Secondary | ICD-10-CM

## 2021-11-01 NOTE — Progress Notes (Signed)
[facility] Counselor/Therapist Progress Note  Patient ID: [name], MRN: [id number],    Date: 11/01/2021  Time Spent: 49 minutes start time 9:03 AM end time 9:52 AM  Treatment Type: Individual Therapy  Reported Symptoms: anxiety,sleep issues, nightmares, sleep eating, triggered responses  Mental Status Exam:  Appearance:   Well Groomed     Behavior:  Appropriate  Motor:  Normal  Speech/Language:   Normal Rate  Affect:  Appropriate  Mood:  anxious  Thought process:  normal  Thought content:    WNL  Sensory/Perceptual disturbances:    WNL  Orientation:  oriented to person, place, time/date, and situation  Attention:  Good  Concentration:  Good  Memory:  WNL  Fund of knowledge:   Good  Insight:    Good  Judgment:   Good  Impulse Control:  Good   Risk Assessment: Danger to Self:  No Self-injurious Behavior: No Danger to Others: No Duty to Warn:no Physical Aggression / Violence:No  Access to Firearms a concern: No  Gang Involvement:No   Subjective: Patient was present for session. She shared she had to go up to see her dad in [place] due to him having a bad abscess and had to have surgery.  She went on to share she didn't know she would have to be taking care of her father.  She shared school is going better for her younger children which is good and her older daughter is working.  Her grandmother isn't doing well either and she didn't feel she could handle seeing her so she didn't go by.  Patient was able to share the difficulty she is having with the health changes with her father.  She was able to share that they finally have had lots of healing in the relationship and she feels he has apologized for everything that was very traumatizing for her without giving any excuses.  Patient was encouraged to try and write some of those things down and remind herself that she will always have those memories and to having that closure allows her to be okay if anything does happen  with her dad.  Encouraged her to work on having the same opportunity of getting closure with her grandmother.  Patient also shared that she is going to be having a hysterectomy and has realized that she is mentally able to have physical intimacy but physical glee she is not able until she has her hysterectomy.  She had lots of anxiety over that talked her through the upcoming surgery and encouraged her to focus on the outcome and recognizing that she will be okay and just needs to rest and recover.  Interventions: Cognitive Behavioral Therapy and Solution-Oriented/Positive Psychology  Diagnosis:   ICD-10-CM   1. PTSD (post-traumatic stress disorder)  F43.10       Plan: Patient is to use CBT and coping skills to continue decreasing triggered responses.  Patient is to work on listening some brain spotting bilateral music and doing a 3-minute journaling exercise prior to bedtime to see if that helps decrease nightmares.  Patient is to work on self talk to remind herself that her she is enough.  Patient is to exercise to release negative emotions appropriately.  Patient is to use acupuncture as a way to reduce stress.   Patient is to take medication as directed.  Patient is to continue working with providers on medical issues.   Long term goal: Develop and implement effective coping skills that allow for carrying out  normal responsibilities and participating relationships and social activities. Short term goal: Practice, implement relaxation training as a coping  Mechanism for tension, panic, stress, anger and anxiety    Stevphen Meuse, [facility]

## 2021-11-14 ENCOUNTER — Ambulatory Visit: Payer: 59 | Admitting: Psychiatry

## 2021-11-14 DIAGNOSIS — F4310 Post-traumatic stress disorder, unspecified: Secondary | ICD-10-CM

## 2021-11-14 DIAGNOSIS — R69 Illness, unspecified: Secondary | ICD-10-CM | POA: Diagnosis not present

## 2021-11-14 NOTE — Progress Notes (Signed)
[facility] Counselor/Therapist Progress Note  Patient ID: [name], MRN: [id number],    Date: 11/14/2021  Time Spent: 50 minutes start time 11:01 AM end time 11:51 AM  Treatment Type: Individual Therapy  Reported Symptoms: pain issues, sadness, anxiety, triggered responses, sleep issues    Mental Status Exam:  Appearance:   Well Groomed     Behavior:  Appropriate  Motor:  Normal  Speech/Language:   Normal Rate  Affect:  Appropriate  Mood:  anxious  Thought process:  normal  Thought content:    WNL  Sensory/Perceptual disturbances:    WNL  Orientation:  oriented to person, place, time/date, and situation  Attention:  Good  Concentration:  Good  Memory:  WNL  Fund of knowledge:   Good  Insight:    Good  Judgment:   Good  Impulse Control:  Good   Risk Assessment: Danger to Self:  No Self-injurious Behavior: No Danger to Others: No Duty to Warn:no Physical Aggression / Violence:No  Access to Firearms a concern: No  Gang Involvement:No   Subjective: Patient was present for session. She shared that her pain has increased and she is ready for her surgery which has been scheduled for December 07, 2021.  Patient stated that they were able to get a new course for her daughter with the help of her parents.  Patient shared she had called her father and said that she needed money and he gave her what she needed.  Discussed how their relationship is so different from when it was when she was growing up.  Encouraged her to affirm the younger part of that truth.  Patient did processing set on something that happened with her husband and her oldest daughter.  She shared that it triggered memories from her past with her dad.  Suds level 6, negative cognition "I am always doing something wrong" felt frustration and sadness in her stomach.  Patient was able to reduce suds level to 3.  She was able to recognize that she was not the one that was always wrong and it is okay for her  not to take that on any longer.  She was able to discuss ways to communicate her concerns with her husband appropriately and continue to work on helping him set appropriate limits with her daughter as well as being able to communicate what those are appropriately.  Interventions: Solution-Oriented/Positive Psychology, Eye Movement Desensitization and Reprocessing (EMDR), and Insight-Oriented  Diagnosis:   ICD-10-CM   1. PTSD (post-traumatic stress disorder)  F43.10       Plan: Patient is to use CBT and coping skills to continue decreasing triggered responses.  Patient is to work on listening some brain spotting bilateral music and doing a 3-minute journaling exercise prior to bedtime to see if that helps decrease nightmares.  Patient is to work on self talk to remind herself that her she is enough.  Patient is to exercise to release negative emotions appropriately.  Patient is to use acupuncture as a way to reduce stress.   Patient is to take medication as directed.  Patient is to continue working with providers on medical issues.   Long term goal: Develop and implement effective coping skills that allow for carrying out normal responsibilities and participating relationships and social activities. Short term goal: Practice, implement relaxation training as a coping  Mechanism for tension, panic, stress, anger and anxiety    Lina Sayre, [facility]

## 2021-11-17 ENCOUNTER — Other Ambulatory Visit: Payer: Self-pay | Admitting: Psychiatry

## 2021-11-17 DIAGNOSIS — F3341 Major depressive disorder, recurrent, in partial remission: Secondary | ICD-10-CM

## 2021-11-17 NOTE — Telephone Encounter (Signed)
Please schedule appt

## 2021-11-28 ENCOUNTER — Encounter (HOSPITAL_BASED_OUTPATIENT_CLINIC_OR_DEPARTMENT_OTHER): Payer: Self-pay | Admitting: Obstetrics and Gynecology

## 2021-11-28 ENCOUNTER — Other Ambulatory Visit: Payer: Self-pay

## 2021-11-28 NOTE — Progress Notes (Signed)
Spoke w/ via phone for pre-op interview---[name] needs dos----urine pregnancy per anesthesia, surgeon orders pending as  of 11/28/21             [name] results------12/05/21 [name] appt for cbc, type & screen COVID test -----patient states asymptomatic no test needed Arrive at -------0600 on Thursday, 12/07/21 NPO after MN NO Solid Food.  Clear liquids from MN until---0500 Med rec completed Medications to take morning of surgery -----Xanax prn, Wellbutrin, Prilosec, Zoloft Diabetic medication -----n/a Patient instructed no nail polish to be worn day of surgery Patient instructed to bring photo id and insurance card day of surgery Patient aware to have Driver (ride ) / caregiver    for 24 hours after surgery - husband, Scientist, physiological Patient Special Instructions -----Extended / overnight instructions given. Pre-Op special Istructions -----Requested orders from Dr. Marvel Plan via Epic IB on 11/28/21. Patient verbalized understanding of instructions that were given at this phone interview. Patient denies shortness of breath, chest pain, fever, cough at this phone interview.

## 2021-11-28 NOTE — Progress Notes (Signed)
Your procedure is scheduled on Thursday, 12/07/2021.  Report to [place] AT 6:00 A. M.   Call this number if you have problems the morning of surgery  :[phone].   OUR ADDRESS IS [place].  WE [facility] LOCATED [facility].  PLEASE BRING YOUR INSURANCE CARD AND PHOTO ID DAY OF SURGERY.  ONLY 2 PEOPLE ARE ALLOWED IN  WAITING  ROOM.                                      REMEMBER:  DO NOT EAT FOOD, CANDY GUM OR MINTS  AFTER MIDNIGHT THE NIGHT BEFORE YOUR SURGERY . YOU MAY HAVE CLEAR LIQUIDS FROM MIDNIGHT THE NIGHT BEFORE YOUR SURGERY UNTIL  5:00 AM. NO CLEAR LIQUIDS AFTER   5:00 AM DAY OF SURGERY.  YOU MAY  BRUSH YOUR TEETH MORNING OF SURGERY AND RINSE YOUR MOUTH OUT, NO CHEWING GUM CANDY OR MINTS.     CLEAR LIQUID DIET   Foods Allowed                                                                     Foods Excluded  Coffee and tea, regular and decaf                             liquids that you cannot  Plain Jell-O                                                                   see through such as: Fruit ices (not with fruit pulp)                                     milk, soups, orange juice  Plain  Popsicles                                    All solid food Carbonated beverages, regular and diet                                    Cranberry, grape and apple juices Sports drinks like Gatorade _____________________________________________________________________     TAKE ONLY THESE MEDICATIONS MORNING OF SURGERY: Xanax if needed, Wellbutrin, Prilosec, Zoloft    UP TO 4 VISITORS  MAY VISIT IN THE EXTENDED RECOVERY ROOM UNTIL 800 PM ONLY.  ONE  VISITOR AGE 52 AND OVER MAY SPEND THE NIGHT AND MUST BE IN EXTENDED RECOVERY ROOM NO LATER THAN 800 PM . YOUR DISCHARGE TIME AFTER YOU SPEND THE NIGHT IS 900 AM THE MORNING AFTER YOUR SURGERY.  YOU MAY PACK A SMALL OVERNIGHT BAG WITH TOILETRIES  FOR YOUR OVERNIGHT STAY IF YOU WISH.  YOUR  PRESCRIPTION MEDICATIONS WILL BE PROVIDED DURING [place].                                      DO NOT WEAR JEWERLY, MAKE UP. DO NOT WEAR LOTIONS, POWDERS, PERFUMES OR NAIL POLISH ON YOUR FINGERNAILS. TOENAIL POLISH IS OK TO WEAR. DO NOT SHAVE FOR 48 HOURS PRIOR TO DAY OF SURGERY. MEN MAY SHAVE FACE AND NECK. CONTACTS, GLASSES, OR DENTURES MAY NOT BE WORN TO SURGERY.  REMEMBER: NO SMOKING, DRUGS OR ALCOHOL FOR 24 HOURS BEFORE YOUR SURGERY.                                    [facility] IS NOT RESPONSIBLE  FOR ANY BELONGINGS.                                                                    Marland Kitchen           [place] - Preparing for Surgery Before surgery, you can play an important role.  Because skin is not sterile, your skin needs to be as free of germs as possible.  You can reduce the number of germs on your skin by washing with CHG (chlorahexidine gluconate) soap before surgery.  CHG is an antiseptic cleaner which kills germs and bonds with the skin to continue killing germs even after washing. Please DO NOT use if you have an allergy to CHG or antibacterial soaps.  If your skin becomes reddened/irritated stop using the CHG and inform your nurse when you arrive at Short Stay. Do not shave (including legs and underarms) for at least 48 hours prior to the first CHG shower.  You may shave your face/neck. Please follow these instructions carefully:  1.  Shower with CHG Soap the night before surgery and the  morning of Surgery.  2.  If you choose to wash your hair, wash your hair first as usual with your  normal  shampoo.  3.  After you shampoo, rinse your hair and body thoroughly to remove the  shampoo.                                        4.  Use CHG as you would any other liquid soap.  You can apply chg directly  to the skin and wash , chg soap provided, night before and morning of your surgery.  5.  Apply the CHG Soap to your body ONLY FROM THE NECK DOWN.   Do not use on face/ open                            Wound or open sores. Avoid contact with eyes, ears mouth and genitals (private parts).                       Wash face,  Genitals (private parts) with your  normal soap.             6.  Wash thoroughly, paying special attention to the area where your surgery  will be performed.  7.  Thoroughly rinse your body with warm water from the neck down.  8.  DO NOT shower/wash with your normal soap after using and rinsing off  the CHG Soap.             9.  Pat yourself dry with a clean towel.            10.  Wear clean pajamas.            11.  Place clean sheets on your bed the night of your first shower and do not  sleep with pets. Day of Surgery : Do not apply any lotions/deodorants the morning of surgery.  Please wear clean clothes to the hospital/surgery center.  IF YOU HAVE ANY SKIN IRRITATION OR PROBLEMS WITH THE SURGICAL SOAP, PLEASE GET A BAR OF GOLD DIAL SOAP AND SHOWER THE NIGHT BEFORE YOUR SURGERY AND THE MORNING OF YOUR SURGERY. PLEASE LET THE NURSE KNOW MORNING OF YOUR SURGERY IF YOU HAD ANY PROBLEMS WITH THE SURGICAL SOAP.   ________________________________________________________________________                                                        QUESTIONS [place] [facility] PRE OP NURSE PHONE [phone].

## 2021-12-04 ENCOUNTER — Ambulatory Visit: Payer: 59 | Admitting: Psychiatry

## 2021-12-05 ENCOUNTER — Encounter (HOSPITAL_COMMUNITY)
Admission: RE | Admit: 2021-12-05 | Discharge: 2021-12-05 | Disposition: A | Discharge status: Home / Self Care | Payer: 59 | Source: Ambulatory Visit | Attending: Obstetrics and Gynecology | Admitting: Obstetrics and Gynecology

## 2021-12-05 DIAGNOSIS — Z01812 Encounter for preprocedural laboratory examination: Secondary | ICD-10-CM | POA: Diagnosis not present

## 2021-12-05 DIAGNOSIS — N946 Dysmenorrhea, unspecified: Secondary | ICD-10-CM | POA: Diagnosis not present

## 2021-12-05 DIAGNOSIS — Z01818 Encounter for other preprocedural examination: Secondary | ICD-10-CM

## 2021-12-05 LAB — CBC
HCT: 39.9 % (ref 36.0–46.0)
Hemoglobin: 13.3 g/dL (ref 12.0–15.0)
MCH: 32.5 pg (ref 26.0–34.0)
MCHC: 33.3 g/dL (ref 30.0–36.0)
MCV: 97.6 fL (ref 80.0–100.0)
Platelets: 264 10*3/uL (ref 150–400)
RBC: 4.09 MIL/uL (ref 3.87–5.11)
RDW: 12.9 % (ref 11.5–15.5)
WBC: 7.4 10*3/uL (ref 4.0–10.5)
nRBC: 0 % (ref 0.0–0.2)

## 2021-12-06 NOTE — H&P (Signed)
[name] is an 46 y.o. female G%P3023 presenting for scheduled TLH/BS/Cystoscopy for dysmenorrhea and dyspareunia. Pt reports significant dysmenorrhea and dyspareunia that is interfering with her day to day activities and becoming worse over the last year. She has not improved with orlissa and does not wish to try OCP's as feels bad on them. We have discussed that ablation may help with flow but may not reslove her pain.  We discussed possible etiologies like adenomyosis or endometriosis. She would like to proceed with definitive surgery with a hysterectomy. Husband has had vasectomy  Pertinent Gynecological History:  OB History: NSVD x 3   Menstrual History: Patient's last menstrual period was 11/03/2021 (exact date).    Past Medical History:  Diagnosis Date   Anxiety    Follows with PMHNP, Corie Chiquito, LOV 08/23/21 in Epic.   Back pain    chronic low back pain   Chronic hip pain 2019   Depression    Follows with PMHNP, Corie Chiquito, LOV 08/23/21 in Epic.   GERD (gastroesophageal reflux disease)    Hepatic steatosis 06/2020   found on CT scan   History of kidney stones    2021 & 2022   Nerve entrapment syndrome    anterior cutaneous nerve entrapment syndrom, hx of nerve blocks, most recent 09/05/21, pt follows with [facility],Elizabeth Aris Everts, NP   PTSD (post-traumatic stress disorder)    Follows with PMHNP, Corie Chiquito, LOV 08/23/21 in Epic.    Past Surgical History:  Procedure Laterality Date   DILATION AND CURETTAGE OF UTERUS  2004   FOOT SURGERY  2001   MOUTH SURGERY  2020   dental implant    Family History  Problem Relation Age of Onset   Hyperlipidemia Mother    Depression Mother    Anxiety disorder Mother    Neuropathy Mother    Fibromyalgia Mother    Heart disease Father    Depression Father    Prostate cancer Father    Stomach cancer Father    Bipolar disorder Maternal Grandmother    Depression Maternal  Uncle    Colon cancer Neg Hx    Esophageal cancer Neg Hx    Rectal cancer Neg Hx     Social History:  reports that she has never smoked. She has never used smokeless tobacco. She reports that she does not currently use alcohol. She reports that she does not use drugs.  Allergies:  Allergies  Allergen Reactions   Meloxicam Nausea And Vomiting   Penicillins Nausea And Vomiting    No medications prior to admission.    Review of Systems  Constitutional:  Negative for fever.  Genitourinary:  Positive for menstrual problem and pelvic pain.    Height 5\' 2"  (1.575 m), weight 73.9 kg, last menstrual period 11/03/2021. Physical Exam Cardiovascular:     Rate and Rhythm: Normal rate and regular rhythm.  Pulmonary:     Effort: Pulmonary effort is normal.  Abdominal:     Palpations: Abdomen is soft.  Genitourinary:    General: Normal vulva.  Neurological:     General: No focal deficit present.     Mental Status: She is alert.  Psychiatric:        Mood and Affect: Mood normal.     No results found for this or any previous visit (from the past 24 hour(s)).  No results found.  Assessment/Plan: Pt was counseled on the risks and benefits of TLH/BS including bleeding, infection, and  possible damage to bowel and bladder or ureters.  She would accept a transfusion if needed.  We reviewed the procedure in detail including laparoscopic vs vaginal closure of cuff.  We discussed a possible incision in the event of any complication and possible delayed recovery from that. She is ready to proceed.    [name] 12/06/2021, 9:23 PM

## 2021-12-07 ENCOUNTER — Other Ambulatory Visit: Payer: Self-pay

## 2021-12-07 ENCOUNTER — Encounter (HOSPITAL_BASED_OUTPATIENT_CLINIC_OR_DEPARTMENT_OTHER): Payer: Self-pay | Admitting: Obstetrics and Gynecology

## 2021-12-07 ENCOUNTER — Ambulatory Visit (HOSPITAL_BASED_OUTPATIENT_CLINIC_OR_DEPARTMENT_OTHER): Payer: 59 | Admitting: Anesthesiology

## 2021-12-07 ENCOUNTER — Encounter (HOSPITAL_BASED_OUTPATIENT_CLINIC_OR_DEPARTMENT_OTHER)
Admission: RE | Disposition: A | Discharge status: Home / Self Care | Payer: Self-pay | Source: Home / Self Care | Attending: Obstetrics and Gynecology

## 2021-12-07 ENCOUNTER — Ambulatory Visit (HOSPITAL_BASED_OUTPATIENT_CLINIC_OR_DEPARTMENT_OTHER)
Admission: RE | Admit: 2021-12-07 | Discharge: 2021-12-07 | Disposition: A | Discharge status: Home / Self Care | Payer: 59 | Attending: Obstetrics and Gynecology | Admitting: Obstetrics and Gynecology

## 2021-12-07 DIAGNOSIS — N8302 Follicular cyst of left ovary: Secondary | ICD-10-CM | POA: Diagnosis not present

## 2021-12-07 DIAGNOSIS — N946 Dysmenorrhea, unspecified: Secondary | ICD-10-CM | POA: Insufficient documentation

## 2021-12-07 DIAGNOSIS — Z01818 Encounter for other preprocedural examination: Secondary | ICD-10-CM

## 2021-12-07 DIAGNOSIS — N941 Unspecified dyspareunia: Secondary | ICD-10-CM | POA: Insufficient documentation

## 2021-12-07 DIAGNOSIS — N8003 Adenomyosis of the uterus: Secondary | ICD-10-CM | POA: Insufficient documentation

## 2021-12-07 DIAGNOSIS — Z90710 Acquired absence of both cervix and uterus: Secondary | ICD-10-CM

## 2021-12-07 DIAGNOSIS — N83202 Unspecified ovarian cyst, left side: Secondary | ICD-10-CM | POA: Diagnosis not present

## 2021-12-07 HISTORY — PX: TOTAL LAPAROSCOPIC HYSTERECTOMY WITH SALPINGECTOMY: SHX6742

## 2021-12-07 HISTORY — DX: Post-traumatic stress disorder, unspecified: F43.10

## 2021-12-07 HISTORY — DX: Personal history of urinary calculi: Z87.442

## 2021-12-07 HISTORY — DX: Mononeuropathy, unspecified: G58.9

## 2021-12-07 HISTORY — PX: CYSTOSCOPY: SHX5120

## 2021-12-07 LAB — TYPE AND SCREEN
ABO/RH(D): A POS
Antibody Screen: NEGATIVE

## 2021-12-07 LAB — ABO/RH: ABO/RH(D): A POS

## 2021-12-07 LAB — POCT PREGNANCY, URINE: Preg Test, Ur: NEGATIVE

## 2021-12-07 SURGERY — HYSTERECTOMY, TOTAL, LAPAROSCOPIC, WITH SALPINGECTOMY
Anesthesia: General | Site: Pelvis

## 2021-12-07 MED ORDER — MIDAZOLAM HCL 2 MG/2ML IJ SOLN
INTRAMUSCULAR | Status: AC
  Filled 2021-12-07: qty 2

## 2021-12-07 MED ORDER — IBUPROFEN 200 MG PO TABS
600.0000 mg | ORAL_TABLET | Freq: Four times a day (QID) | ORAL | Status: DC
  Administered 2021-12-07: 600 mg via ORAL

## 2021-12-07 MED ORDER — IBUPROFEN 600 MG PO TABS: 600.0000 mg | ORAL_TABLET | Freq: Four times a day (QID) | ORAL | 0 refills | Status: AC

## 2021-12-07 MED ORDER — PANTOPRAZOLE SODIUM 40 MG PO TBEC
40.0000 mg | DELAYED_RELEASE_TABLET | Freq: Every day | ORAL | Status: DC
  Administered 2021-12-07: 40 mg via ORAL

## 2021-12-07 MED ORDER — BUPIVACAINE HCL (PF) 0.25 % IJ SOLN
INTRAMUSCULAR | Status: DC | PRN
  Administered 2021-12-07: 10 mL

## 2021-12-07 MED ORDER — BUPROPION HCL ER (XL) 300 MG PO TB24: 300.0000 mg | ORAL_TABLET | Freq: Every day | ORAL | Status: DC

## 2021-12-07 MED ORDER — SIMETHICONE 80 MG PO CHEW
80.0000 mg | CHEWABLE_TABLET | Freq: Four times a day (QID) | ORAL | Status: DC | PRN
  Administered 2021-12-07: 80 mg via ORAL

## 2021-12-07 MED ORDER — OXYCODONE HCL 5 MG PO TABS
ORAL_TABLET | ORAL | Status: AC
  Filled 2021-12-07: qty 2

## 2021-12-07 MED ORDER — AMISULPRIDE (ANTIEMETIC) 5 MG/2ML IV SOLN
INTRAVENOUS | Status: AC
  Filled 2021-12-07: qty 4

## 2021-12-07 MED ORDER — LIDOCAINE HCL (PF) 2 % IJ SOLN
INTRAMUSCULAR | Status: AC
  Filled 2021-12-07: qty 5

## 2021-12-07 MED ORDER — ONDANSETRON HCL 4 MG/2ML IJ SOLN
INTRAMUSCULAR | Status: DC | PRN
  Administered 2021-12-07: 4 mg via INTRAVENOUS

## 2021-12-07 MED ORDER — DEXAMETHASONE SODIUM PHOSPHATE 10 MG/ML IJ SOLN
INTRAMUSCULAR | Status: AC
  Filled 2021-12-07: qty 1

## 2021-12-07 MED ORDER — MIDAZOLAM HCL 5 MG/5ML IJ SOLN
INTRAMUSCULAR | Status: DC | PRN
  Administered 2021-12-07: 2 mg via INTRAVENOUS

## 2021-12-07 MED ORDER — KETOROLAC TROMETHAMINE 30 MG/ML IJ SOLN
INTRAMUSCULAR | Status: DC | PRN
  Administered 2021-12-07: 30 mg via INTRAVENOUS

## 2021-12-07 MED ORDER — ONDANSETRON HCL 4 MG/2ML IJ SOLN: 4.0000 mg | Freq: Four times a day (QID) | INTRAMUSCULAR | Status: DC | PRN

## 2021-12-07 MED ORDER — ROCURONIUM BROMIDE 10 MG/ML (PF) SYRINGE
PREFILLED_SYRINGE | INTRAVENOUS | Status: AC
  Filled 2021-12-07: qty 10

## 2021-12-07 MED ORDER — ACETAMINOPHEN 500 MG PO TABS
ORAL_TABLET | ORAL | Status: AC
  Filled 2021-12-07: qty 2

## 2021-12-07 MED ORDER — OXYCODONE HCL 5 MG PO TABS: 5.0000 mg | ORAL_TABLET | ORAL | 0 refills | Status: DC | PRN

## 2021-12-07 MED ORDER — LACTATED RINGERS IV SOLN: INTRAVENOUS | Status: DC

## 2021-12-07 MED ORDER — ROCURONIUM BROMIDE 100 MG/10ML IV SOLN
INTRAVENOUS | Status: DC | PRN
  Administered 2021-12-07: 20 mg via INTRAVENOUS
  Administered 2021-12-07: 60 mg via INTRAVENOUS

## 2021-12-07 MED ORDER — IBUPROFEN 200 MG PO TABS
ORAL_TABLET | ORAL | Status: AC
  Filled 2021-12-07: qty 3

## 2021-12-07 MED ORDER — PROPOFOL 10 MG/ML IV BOLUS
INTRAVENOUS | Status: DC | PRN
  Administered 2021-12-07: 150 mg via INTRAVENOUS
  Administered 2021-12-07: 50 mg via INTRAVENOUS

## 2021-12-07 MED ORDER — PROPOFOL 10 MG/ML IV BOLUS
INTRAVENOUS | Status: AC
  Filled 2021-12-07: qty 20

## 2021-12-07 MED ORDER — FENTANYL CITRATE (PF) 250 MCG/5ML IJ SOLN
INTRAMUSCULAR | Status: AC
  Filled 2021-12-07: qty 5

## 2021-12-07 MED ORDER — DEXAMETHASONE SODIUM PHOSPHATE 4 MG/ML IJ SOLN
INTRAMUSCULAR | Status: DC | PRN
  Administered 2021-12-07: 5 mg via INTRAVENOUS

## 2021-12-07 MED ORDER — FENTANYL CITRATE (PF) 100 MCG/2ML IJ SOLN
INTRAMUSCULAR | Status: AC
  Filled 2021-12-07: qty 2

## 2021-12-07 MED ORDER — ONDANSETRON HCL 4 MG PO TABS
4.0000 mg | ORAL_TABLET | Freq: Four times a day (QID) | ORAL | Status: DC | PRN
  Administered 2021-12-07: 4 mg via ORAL

## 2021-12-07 MED ORDER — ONDANSETRON 4 MG PO TBDP
ORAL_TABLET | ORAL | Status: AC
  Filled 2021-12-07: qty 1

## 2021-12-07 MED ORDER — POVIDONE-IODINE 10 % EX SWAB: 2.0000 | Freq: Once | CUTANEOUS | Status: DC

## 2021-12-07 MED ORDER — SIMETHICONE 80 MG PO CHEW
CHEWABLE_TABLET | ORAL | Status: AC
  Filled 2021-12-07: qty 1

## 2021-12-07 MED ORDER — AMISULPRIDE (ANTIEMETIC) 5 MG/2ML IV SOLN
10.0000 mg | Freq: Once | INTRAVENOUS | Status: AC
  Administered 2021-12-07: 10 mg via INTRAVENOUS

## 2021-12-07 MED ORDER — OXYCODONE HCL 5 MG PO TABS
5.0000 mg | ORAL_TABLET | ORAL | Status: DC | PRN
  Administered 2021-12-07 (×2): 10 mg via ORAL

## 2021-12-07 MED ORDER — HYDROMORPHONE HCL 1 MG/ML IJ SOLN
INTRAMUSCULAR | Status: AC
  Filled 2021-12-07: qty 1

## 2021-12-07 MED ORDER — MENTHOL 3 MG MT LOZG: 1.0000 | LOZENGE | OROMUCOSAL | Status: DC | PRN

## 2021-12-07 MED ORDER — CEFAZOLIN SODIUM-DEXTROSE 2-4 GM/100ML-% IV SOLN
INTRAVENOUS | Status: AC
  Filled 2021-12-07: qty 100

## 2021-12-07 MED ORDER — ACETAMINOPHEN 500 MG PO TABS
1000.0000 mg | ORAL_TABLET | Freq: Four times a day (QID) | ORAL | Status: DC
  Administered 2021-12-07 (×2): 1000 mg via ORAL

## 2021-12-07 MED ORDER — HYDROMORPHONE HCL 1 MG/ML IJ SOLN
0.2500 mg | INTRAMUSCULAR | Status: DC | PRN
  Administered 2021-12-07: 0.5 mg via INTRAVENOUS
  Administered 2021-12-07 (×2): 0.25 mg via INTRAVENOUS

## 2021-12-07 MED ORDER — FENTANYL CITRATE (PF) 100 MCG/2ML IJ SOLN
INTRAMUSCULAR | Status: DC | PRN
  Administered 2021-12-07 (×2): 50 ug via INTRAVENOUS
  Administered 2021-12-07: 100 ug via INTRAVENOUS
  Administered 2021-12-07: 50 ug via INTRAVENOUS

## 2021-12-07 MED ORDER — LIDOCAINE HCL (CARDIAC) PF 100 MG/5ML IV SOSY
PREFILLED_SYRINGE | INTRAVENOUS | Status: DC | PRN
  Administered 2021-12-07: 60 mg via INTRAVENOUS

## 2021-12-07 MED ORDER — HYDROMORPHONE HCL 1 MG/ML IJ SOLN
0.2000 mg | INTRAMUSCULAR | Status: DC | PRN
  Administered 2021-12-07: 0.5 mg via INTRAVENOUS

## 2021-12-07 MED ORDER — SERTRALINE HCL 50 MG PO TABS: 150.0000 mg | ORAL_TABLET | Freq: Every day | ORAL | Status: DC

## 2021-12-07 MED ORDER — SUGAMMADEX SODIUM 200 MG/2ML IV SOLN
INTRAVENOUS | Status: DC | PRN
  Administered 2021-12-07: 200 mg via INTRAVENOUS

## 2021-12-07 MED ORDER — ACETAMINOPHEN 500 MG PO TABS: 1000.0000 mg | ORAL_TABLET | Freq: Four times a day (QID) | ORAL | 0 refills | Status: AC

## 2021-12-07 MED ORDER — SODIUM CHLORIDE 0.9 % IR SOLN
Status: DC | PRN
  Administered 2021-12-07: 1000 mL

## 2021-12-07 MED ORDER — MAGNESIUM HYDROXIDE 400 MG/5ML PO SUSP: 30.0000 mL | Freq: Every day | ORAL | Status: DC | PRN

## 2021-12-07 MED ORDER — GABAPENTIN 100 MG PO CAPS
ORAL_CAPSULE | ORAL | Status: AC
  Filled 2021-12-07: qty 1

## 2021-12-07 MED ORDER — CEFAZOLIN SODIUM-DEXTROSE 2-4 GM/100ML-% IV SOLN
2.0000 g | INTRAVENOUS | Status: AC
  Administered 2021-12-07: 2 g via INTRAVENOUS

## 2021-12-07 MED ORDER — PHENYLEPHRINE HCL (PRESSORS) 10 MG/ML IV SOLN
INTRAVENOUS | Status: DC | PRN
  Administered 2021-12-07 (×5): 80 ug via INTRAVENOUS

## 2021-12-07 MED ORDER — PANTOPRAZOLE SODIUM 40 MG PO TBEC
DELAYED_RELEASE_TABLET | ORAL | Status: AC
  Filled 2021-12-07: qty 1

## 2021-12-07 MED ORDER — ONDANSETRON HCL 4 MG/2ML IJ SOLN
INTRAMUSCULAR | Status: AC
  Filled 2021-12-07: qty 2

## 2021-12-07 MED ORDER — GABAPENTIN 100 MG PO CAPS: 100.0000 mg | ORAL_CAPSULE | Freq: Two times a day (BID) | ORAL | Status: DC

## 2021-12-07 SURGICAL SUPPLY — 44 items
ADH SKN CLS APL DERMABOND .7 (GAUZE/BANDAGES/DRESSINGS) ×2
BARRIER ADHS 3X4 INTERCEED (GAUZE/BANDAGES/DRESSINGS) IMPLANT
BRR ADH 4X3 ABS CNTRL BYND (GAUZE/BANDAGES/DRESSINGS)
CABLE HIGH FREQUENCY MONO STRZ (ELECTRODE) IMPLANT
COVER BACK TABLE 60X90IN (DRAPES) ×3 IMPLANT
COVER MAYO STAND STRL (DRAPES) ×3 IMPLANT
DERMABOND ADVANCED .7 DNX12 (GAUZE/BANDAGES/DRESSINGS) ×3 IMPLANT
DEVICE SUTURE ENDOST 10MM (ENDOMECHANICALS) ×3 IMPLANT
DISSECTOR BLUNT TIP ENDO 5MM (MISCELLANEOUS) IMPLANT
DURAPREP 26ML APPLICATOR (WOUND CARE) ×3 IMPLANT
GAUZE 4X4 16PLY ~~LOC~~+RFID DBL (SPONGE) ×6 IMPLANT
GLOVE BIO SURGEON STRL SZ 6.5 (GLOVE) ×6 IMPLANT
GLOVE BIO SURGEON STRL SZ7 (GLOVE) ×6 IMPLANT
GOWN STRL REUS W/TWL LRG LVL3 (GOWN DISPOSABLE) ×9 IMPLANT
KIT TURNOVER CYSTO (KITS) ×3 IMPLANT
NDL INSUFFLATION 14GA 120MM (NEEDLE) ×3 IMPLANT
NEEDLE INSUFFLATION 14GA 120MM (NEEDLE) ×2 IMPLANT
NS IRRIG 1000ML POUR BTL (IV SOLUTION) ×3 IMPLANT
OCCLUDER COLPOPNEUMO (BALLOONS) ×3 IMPLANT
PACK LAPAROSCOPY BASIN (CUSTOM PROCEDURE TRAY) ×3 IMPLANT
PACK TRENDGUARD 450 HYBRID PRO (MISCELLANEOUS) ×3 IMPLANT
SCISSORS LAP 5X35 DISP (ENDOMECHANICALS) IMPLANT
SET IRRIG Y TYPE TUR BLADDER L (SET/KITS/TRAYS/PACK) ×3 IMPLANT
SET SUCTION IRRIG HYDROSURG (IRRIGATION / IRRIGATOR) ×3 IMPLANT
SET TRI-LUMEN FLTR TB AIRSEAL (TUBING) ×3 IMPLANT
SHEARS 1100 HARMONIC 36 (ELECTROSURGICAL) ×3 IMPLANT
SUT ENDO VLOC 180-0-8IN (SUTURE) ×3 IMPLANT
SUT VIC AB 0 CT1 36 (SUTURE) ×6 IMPLANT
SUT VIC AB 4-0 PS2 27 (SUTURE) ×3 IMPLANT
SUT VICRYL 0 UR6 27IN ABS (SUTURE) ×3 IMPLANT
SYR 10ML LL (SYRINGE) ×3 IMPLANT
SYR 50ML LL SCALE MARK (SYRINGE) ×3 IMPLANT
SYSTEM CARTER THOMASON II (TROCAR) ×3 IMPLANT
TIP UTERINE 5.1X6CM LAV DISP (MISCELLANEOUS) IMPLANT
TIP UTERINE 6.7X10CM GRN DISP (MISCELLANEOUS) IMPLANT
TIP UTERINE 6.7X6CM WHT DISP (MISCELLANEOUS) IMPLANT
TIP UTERINE 6.7X8CM BLUE DISP (MISCELLANEOUS) IMPLANT
TOWEL OR 17X26 10 PK STRL BLUE (TOWEL DISPOSABLE) ×3 IMPLANT
TRAY FOLEY W/BAG SLVR 14FR LF (SET/KITS/TRAYS/PACK) ×3 IMPLANT
TRENDGUARD 450 HYBRID PRO PACK (MISCELLANEOUS) ×2
TROCAR PORT AIRSEAL 5X120 (TROCAR) ×3 IMPLANT
TROCAR Z-THREAD FIOS 11X100 BL (TROCAR) ×3 IMPLANT
TROCAR Z-THREAD FIOS 5X100MM (TROCAR) ×3 IMPLANT
WARMER LAPAROSCOPE (MISCELLANEOUS) ×3 IMPLANT

## 2021-12-07 NOTE — Progress Notes (Signed)
DOS  TLH/BS  Pt reports has ambulated multiple times, tolerating po with no N/V. Also voiding well multiple times.  Pain controlled with po meds. She wishes to go home.  Afeb VSS  Abdomen soft Incisions well-approximated, some bruising  Pt meeting all milestones for d/c D/c home to f/u in 2 weeks for incision check Reviewed precautions

## 2021-12-07 NOTE — Anesthesia Preprocedure Evaluation (Addendum)
Anesthesia Evaluation  Patient identified by MRN, date of birth, ID band Patient awake    Reviewed: Allergy & Precautions, NPO status , Patient's Chart, lab work & pertinent test results  Airway Mallampati: II       Dental   Pulmonary neg pulmonary ROS   breath sounds clear to auscultation       Cardiovascular negative cardio ROS  Rhythm:Regular Rate:Normal     Neuro/Psych  Neuromuscular disease    GI/Hepatic Neg liver ROS,GERD  ,,  Endo/Other  negative endocrine ROS    Renal/GU negative Renal ROS     Musculoskeletal   Abdominal   Peds  Hematology   Anesthesia Other Findings   Reproductive/Obstetrics                             Anesthesia Physical Anesthesia Plan  ASA: 2  Anesthesia Plan: General   Post-op Pain Management:    Induction: Intravenous  PONV Risk Score and Plan: Treatment may vary due to age or medical condition, Ondansetron, Dexamethasone and Midazolam  Airway Management Planned: Oral ETT  Additional Equipment:   Intra-op Plan:   Post-operative Plan: Extubation in OR  Informed Consent: I have reviewed the patients History and Physical, chart, labs and discussed the procedure including the risks, benefits and alternatives for the proposed anesthesia with the patient or authorized representative who has indicated his/her understanding and acceptance.     Dental advisory given  Plan Discussed with: CRNA and Anesthesiologist  Anesthesia Plan Comments:        Anesthesia Quick Evaluation

## 2021-12-07 NOTE — Op Note (Signed)
An experienced assistant was required given the standard of surgical care given the complexity of the case.  This assistant was needed for exposure, dissection, suctioning, retraction, instrument exchange, assisting with delivery with administration of fundal pressure, and for overall help during the procedure.   Operative Note    Preoperative Diagnosis Dysmenorrhea Dyspareunia  Postoperative Diagnosis same  Procedure Laparoscopic hysterectomy with bilateral salpingectomies and resection left ovarian cyst Cystoscopy  Surgeon Huel Cote, MD Pryor Ochoa, DO  Anesthesia GETA  Fluids: EBL UOP clear [id number] LR  Findings The uterus was globular and enlarged to about 8 weeks size.  The fallopian tubes were normal.  The left ovary had a cystic structure hanging off from a pedicle that was removed.  The remainder of the pelvis was normal  Specimen Uterus, cervix, and bilateral tubes, left ovarian cyst  Procedure Note Patient was taken to the operating room where general anesthesia was obtained without difficulty she was prepped and draped in the normal sterile fashion in the dorsal lithotomy position. An appropriate time out was performed. A Rumi with cervical cup was placed after dilation.  The vaginal occluder was placed but not filled and a Foley catheter placed in the bladder. Attention was then turned to the abdomen where of infraumbilical incision was made after injection with quarter percent Marcaine approximate 1 cm in width.  The verees needle was introduced and intraperitoneal placement confirmed was aspiration and injection with normal saline.  Gas flow was applied and pneumoperitoneum obtained. The optiview 17mm trocar was then inserted under direct visualization.   Two additional  ports were placed under direct visualization from the lateral upper quadrants. A 7mm port was placed on the right and an eleven mm port on the left.   Each port site was injected  with quarter percent Marcaine prior placement.   With patient in Trendelenburg the uterus and tubes and ovaries were inspected with findings as previously stated. The Harmonic scalpel was then utilized to dissect the fallopian tubes from the mesosalpinx bilaterally down to the level of the cornua.  The remainder of the uteroovarian ligament and the round ligament were then also taken down with the Harmonic to the level of the bladder flap.  The bladder flap was taken down from the lower uterine segment and pushed away to expose the cervix.  The uterine arteries were then taken down bilaterally with no difficulty and the vaginal cuff identified and opened over the vaginal ring.  The cuff was then completely opened over the ring until the uterus was completely free.  It was then pulled down into the vagina.  The vaginal cuff was then closed with a running v-lock suture with good closure.The cuff and pedicles were hemostatic. A four quadrant view of the pelvis and abdomen was performed and found to be normal with no bleeding or injuries noted.  The Carter-Thomasson device was used to close the left 11 mm port fascia with 0-vicryl under direct visualization.   The instruments were removed from the abdomen as well as the  ports under visualization.  The skin at the port sites was closed with 3-0 vicryl.   Dermabond was placed over the incisions. Attention was turned vaginally where the specimen was removed from the vagina and handed off.  The foley was then removed and the 70 degree cystoscope introduced into the bladder.  There were no sutures or injuries and the ureteral jets were seen bilaterally and appeared normal.  All instruments were then removed.  Patient was then awakened and taken to the recovery room in good condition.

## 2021-12-07 NOTE — Anesthesia Postprocedure Evaluation (Signed)
Anesthesia Post Note  Patient: [name]  Procedure(s) Performed: TOTAL LAPAROSCOPIC HYSTERECTOMY WITH SALPINGECTOMY (Bilateral: Pelvis) CYSTOSCOPY (Bladder)     Patient location during evaluation: PACU Anesthesia Type: General Level of consciousness: awake Pain management: pain level controlled Vital Signs Assessment: post-procedure vital signs reviewed and stable Respiratory status: spontaneous breathing Cardiovascular status: stable Postop Assessment: no apparent nausea or vomiting Anesthetic complications: no   No notable events documented.  Last Vitals:  Vitals:   12/07/21 0637 12/07/21 1030  BP: (!) 118/48 (!) 88/42  Pulse: 80 97  Resp: 16 14  Temp: 36.8 C   SpO2: 99% 95%    Last Pain:  Vitals:   12/07/21 0637  TempSrc: Oral  PainSc: 3                  Christiana Gurevich

## 2021-12-07 NOTE — Interval H&P Note (Signed)
History and Physical Interval Note:  12/07/2021 7:37 AM  [name]  has presented today for surgery, with the diagnosis of dysmenorrhea.  The various methods of treatment have been discussed with the patient and family. After consideration of risks, benefits and other options for treatment, the patient has consented to  Procedure(s): TOTAL LAPAROSCOPIC HYSTERECTOMY WITH SALPINGECTOMY (Bilateral) CYSTOSCOPY (N/A) as a surgical intervention.  The patient's history has been reviewed, patient examined, no change in status, stable for surgery.  I have reviewed the patient's chart and labs.  Questions were answered to the patient's satisfaction.     [name]

## 2021-12-07 NOTE — Anesthesia Procedure Notes (Signed)
Procedure Name: Intubation Date/Time: 12/07/2021 8:08 AM  Performed by: Justice Rocher, CRNAPre-anesthesia Checklist: Patient identified, Emergency Drugs available, Suction available, Patient being monitored and Timeout performed Patient Re-evaluated:Patient Re-evaluated prior to induction Oxygen Delivery Method: Circle system utilized Preoxygenation: Pre-oxygenation with 100% oxygen Induction Type: IV induction Ventilation: Mask ventilation without difficulty Laryngoscope Size: Mac and 3 Grade View: Grade II Tube type: Oral Tube size: 7.0 mm Number of attempts: 1 Airway Equipment and Method: Stylet and Oral airway Placement Confirmation: ETT inserted through vocal cords under direct vision, positive ETCO2, breath sounds checked- equal and bilateral and CO2 detector Secured at: 22 cm Tube secured with: Tape Dental Injury: Teeth and Oropharynx as per pre-operative assessment

## 2021-12-07 NOTE — Transfer of Care (Signed)
Immediate Anesthesia Transfer of Care Note  Patient: [name]  Procedure(s) Performed: Procedure(s) (LRB): TOTAL LAPAROSCOPIC HYSTERECTOMY WITH SALPINGECTOMY (Bilateral) CYSTOSCOPY (N/A)  Patient Location: PACU  Anesthesia Type: General  Level of Consciousness: awake, sedated, patient cooperative and responds to stimulation  Airway & Oxygen Therapy: Patient Spontanous Breathing and Patient connected to [place] oxygen  Post-op Assessment: Report given to PACU RN, Post -op Vital signs reviewed and stable and Patient moving all extremities  Post vital signs: Reviewed and stable  Complications: No apparent anesthesia complications

## 2021-12-08 ENCOUNTER — Encounter (HOSPITAL_BASED_OUTPATIENT_CLINIC_OR_DEPARTMENT_OTHER): Payer: Self-pay | Admitting: Obstetrics and Gynecology

## 2021-12-08 LAB — SURGICAL PATHOLOGY

## 2021-12-18 ENCOUNTER — Ambulatory Visit: Payer: 59 | Admitting: Psychiatry

## 2021-12-20 ENCOUNTER — Telehealth: Payer: 59 | Admitting: Psychiatry

## 2021-12-20 DIAGNOSIS — Z91199 Patient's noncompliance with other medical treatment and regimen due to unspecified reason: Secondary | ICD-10-CM

## 2021-12-20 NOTE — Progress Notes (Signed)
[name] [id number] 09/24/1975 46 y.o.  Patient did not log into scheduled MyChart Video visit. Attempted to reach pt by phone x 2. Call went directly to VM. Unable to leave a message as voicemail box is full.

## 2021-12-26 ENCOUNTER — Ambulatory Visit: Payer: 59 | Admitting: Psychiatry

## 2021-12-26 ENCOUNTER — Encounter: Payer: Self-pay | Admitting: Psychiatry

## 2021-12-26 DIAGNOSIS — F5105 Insomnia due to other mental disorder: Secondary | ICD-10-CM

## 2021-12-26 DIAGNOSIS — F99 Mental disorder, not otherwise specified: Secondary | ICD-10-CM

## 2021-12-26 DIAGNOSIS — F4310 Post-traumatic stress disorder, unspecified: Secondary | ICD-10-CM

## 2021-12-26 DIAGNOSIS — F3341 Major depressive disorder, recurrent, in partial remission: Secondary | ICD-10-CM | POA: Diagnosis not present

## 2021-12-26 DIAGNOSIS — R69 Illness, unspecified: Secondary | ICD-10-CM | POA: Diagnosis not present

## 2021-12-26 MED ORDER — DOXAZOSIN MESYLATE 4 MG PO TABS: 4.0000 mg | ORAL_TABLET | Freq: Every day | ORAL | 1 refills | Status: DC

## 2021-12-26 MED ORDER — TRAZODONE HCL 50 MG PO TABS: ORAL_TABLET | ORAL | 2 refills | Status: DC

## 2021-12-26 MED ORDER — ARIPIPRAZOLE 5 MG PO TABS: 5.0000 mg | ORAL_TABLET | Freq: Every day | ORAL | 1 refills | Status: DC

## 2021-12-26 MED ORDER — BUPROPION HCL ER (XL) 300 MG PO TB24: 300.0000 mg | ORAL_TABLET | Freq: Every day | ORAL | 2 refills | Status: DC

## 2021-12-26 MED ORDER — ALPRAZOLAM 0.5 MG PO TABS: ORAL_TABLET | ORAL | 2 refills | Status: DC

## 2021-12-26 MED ORDER — SERTRALINE HCL 100 MG PO TABS: 150.0000 mg | ORAL_TABLET | Freq: Every day | ORAL | 1 refills | Status: DC

## 2021-12-26 NOTE — Progress Notes (Signed)
[name] [id number] [birth year] 46 y.o.  Subjective:   Patient ID:  [name] is a 46 y.o. (DOB Jun 04, 1975) female.  Chief Complaint:  Chief Complaint  Patient presents with   Follow-up    Anxiety, depression, insomnia    HPI [name] presents to the office today for follow-up of depression, anxiety, and insomnia.   She had a hysterectomy a couple of weeks ago for endometriosis and reports that her recovery is going well overall. She reports that she has had fatigue after surgery. Motivation is improving. She reports that her anxiety has been ok. Denies depressed mood. Sleep has improved. She reports that she had a paradoxical response to Belsomra and stopped it. She resumed Doxazosin about 4 weeks ago. She reports that night terrors have occurred about once a week now and were happening every 1-2 nights. She has had a few flashbacks and intrusive memories in response to helping someone that is experiencing trauma. appetite was ok prior to surgery. Appetite has been decreased. Concentration has been ok. Denies SI.   Takes Xanax prn on occasion.  One of her daughter's friends is currently living with them.   She reports that Abilify 5 mg seems to be more effective than 2 mg.   Alprazolam last filled 12/08/21.  Past Psychiatric Medication Trials: Effexor XR Wellbutrin- Causes GI upset in the morning. Prefers to take in the morning. Trazodone- Reports that she has taken it intermittently. Reports that 25 mg is ineffective. Sertraline Doxazosin- helpful for nightmares. Noticed some fatigue Prazosin- Not effective and caused Silenor- Reports that 3 mg was effective but cost prohibitive. Doxepin- Excessive somnolence Belsomra- Paradoxical effect Gabapentin- caused brain fog  AIMS    Flowsheet Row Office Visit from 12/26/2021 in [facility]  AIMS Total Score 0      Flowsheet Row Admission (Discharged) from 12/07/2021 in [facility] ED from  01/18/2021 in [facility]PART[facility]  C-SSRS RISK CATEGORY No Risk No Risk        Review of Systems:  Review of Systems  Constitutional:  Positive for fatigue.  Genitourinary:        Soreness from recent hysterectomy  Musculoskeletal:  Negative for gait problem.  Neurological:  Negative for tremors.  Psychiatric/Behavioral:         Please refer to HPI    Medications: I have reviewed the patient's current medications.  Current Outpatient Medications  Medication Sig Dispense Refill   APPLE CIDER VINEGAR PO Take by mouth.     Calcium Carb-Cholecalciferol (CALCIUM 500 + D PO) Take by mouth.     MAGNESIUM PO Take by mouth daily.     Multiple Vitamins-Minerals (MULTIVITAMIN PO) Take by mouth daily.     Omega-3 Fatty Acids (FISH OIL) 1000 MG CAPS Take by mouth.     omeprazole (PRILOSEC OTC) 20 MG tablet Take 20 mg by mouth daily as needed.     Probiotic Product (PROBIOTIC PO) Take by mouth.     acetaminophen (TYLENOL) 500 MG tablet Take 2 tablets (1,000 mg total) by mouth every 6 (six) hours. (Patient not taking: Reported on 12/26/2021) 30 tablet 0   [START ON 01/05/2022] ALPRAZolam (XANAX) 0.5 MG tablet Take 1/2-1 tab po BID prn anxiety/panic 30 tablet 2   ARIPiprazole (ABILIFY) 5 MG tablet Take 1 tablet (5 mg total) by mouth daily. TAKE 1/2-1 TABLET BY MOUTH ONCE DAILY 90 tablet 1   buPROPion (WELLBUTRIN XL) 300 MG 24 hr tablet Take 1 tablet (300 mg total) by mouth  daily. 90 tablet 2   doxazosin (CARDURA) 4 MG tablet Take 1 tablet (4 mg total) by mouth at bedtime. 90 tablet 1   ibuprofen (ADVIL) 600 MG tablet Take 1 tablet (600 mg total) by mouth every 6 (six) hours. (Patient not taking: Reported on 12/26/2021) 30 tablet 0   oxyCODONE (OXY IR/ROXICODONE) 5 MG immediate release tablet Take 1-2 tablets (5-10 mg total) by mouth every 4 (four) hours as needed for moderate pain. (Patient not taking: Reported on 12/26/2021) 20 tablet 0   sertraline (ZOLOFT) 100 MG tablet  Take 1.5 tablets (150 mg total) by mouth daily. 135 tablet 1   traZODone (DESYREL) 50 MG tablet Take 1/2-1 tab po QHS prn insomnia 90 tablet 2   No current facility-administered medications for this visit.    Medication Side Effects: None  Allergies:  Allergies  Allergen Reactions   Meloxicam Nausea And Vomiting   Penicillins Nausea And Vomiting    Past Medical History:  Diagnosis Date   Anxiety    Follows with PMHNP, Corie Chiquito, LOV 08/23/21 in Epic.   Back pain    chronic low back pain   Chronic hip pain 2019   Depression    Follows with PMHNP, Corie Chiquito, LOV 08/23/21 in Epic.   GERD (gastroesophageal reflux disease)    Hepatic steatosis 06/2020   found on CT scan   History of kidney stones    2021 & 2022   Nerve entrapment syndrome    anterior cutaneous nerve entrapment syndrom, hx of nerve blocks, most recent 09/05/21, pt follows with [facility],Elizabeth Aris Everts, NP   PTSD (post-traumatic stress disorder)    Follows with PMHNP, Corie Chiquito, LOV 08/23/21 in Epic.    Past Medical History, Surgical history, Social history, and Family history were reviewed and updated as appropriate.   Please see review of systems for further details on the patient's review from today.   Objective:   Physical Exam:  LMP 11/03/2021 (Exact Date)   Physical Exam Constitutional:      General: She is not in acute distress. Musculoskeletal:        General: No deformity.  Neurological:     Mental Status: She is alert and oriented to person, place, and time.     Coordination: Coordination normal.  Psychiatric:        Attention and Perception: Attention and perception normal. She does not perceive auditory or visual hallucinations.        Mood and Affect: Mood is not depressed. Affect is not labile, blunt, angry or inappropriate.        Speech: Speech normal.        Behavior: Behavior normal.        Thought Content: Thought content  normal. Thought content is not paranoid or delusional. Thought content does not include homicidal or suicidal ideation. Thought content does not include homicidal or suicidal plan.        Cognition and Memory: Cognition and memory normal.        Judgment: Judgment normal.     Comments: Insight intact Mood presents as less anxious     Lab Review:     Component Value Date/Time   NA 134 (L) 01/18/2021 2245   K 3.8 01/18/2021 2245   CL 100 01/18/2021 2245   CO2 26 01/18/2021 2245   GLUCOSE 121 (H) 01/18/2021 2245   BUN 19 01/18/2021 2245   CREATININE 1.03 (H) 01/18/2021 2245   CALCIUM 9.5  01/18/2021 2245   PROT 6.9 01/18/2021 2245   ALBUMIN 4.0 01/18/2021 2245   AST 23 01/18/2021 2245   ALT 22 01/18/2021 2245   ALKPHOS 62 01/18/2021 2245   BILITOT 0.4 01/18/2021 2245   GFRNONAA >60 01/18/2021 2245       Component Value Date/Time   WBC 7.4 12/05/2021 1016   RBC 4.09 12/05/2021 1016   HGB 13.3 12/05/2021 1016   HCT 39.9 12/05/2021 1016   PLT 264 12/05/2021 1016   MCV 97.6 12/05/2021 1016   MCH 32.5 12/05/2021 1016   MCHC 33.3 12/05/2021 1016   RDW 12.9 12/05/2021 1016   LYMPHSABS 2.1 01/18/2021 2245   MONOABS 1.0 01/18/2021 2245   EOSABS 0.0 01/18/2021 2245   BASOSABS 0.0 01/18/2021 2245    No results found for: "POCLITH", "LITHIUM"   No results found for: "PHENYTOIN", "PHENOBARB", "VALPROATE", "CBMZ"   .res Assessment: Plan:    Pt seen for 30 minutes and time spent discussing response to treatment. She reports that she would like to continue Abilify 5 mg daily since this seems to be more effective for her energy and motivation compared to the 2 mg dose. She reports that Belsomra had a paradoxical response and caused sleeplessness, and she therefore stopped taking it. Will remove Belsomra from med list. She reports that she resumed Doxazosin and this has been helpful for her insomnia and nightmares. Will resume Doxazosin 4 mg po QHS for insomnia and nightmares.   Continue Wellbutrin XL 300 mg daily for depression.  Continue Sertraline 150 mg po qd for depression and anxiety.  Continue Alprazolam 0.5 mg 1/2-1 tab po BID prn anxiety.  Continue Trazodone 50 mg 1/2-1 tab po QHS prn insomnia.  Recommend continuing therapy with Stevphen Meuse, [facility].  Pt to follow-up in 6 months or sooner if clinically indicated.  Patient advised to contact office with any questions, adverse effects, or acute worsening in signs and symptoms.   Jeanie was seen today for follow-up.  Diagnoses and all orders for this visit:  Recurrent major depressive disorder, in partial remission (HCC) -     ARIPiprazole (ABILIFY) 5 MG tablet; Take 1 tablet (5 mg total) by mouth daily. TAKE 1/2-1 TABLET BY MOUTH ONCE DAILY -     buPROPion (WELLBUTRIN XL) 300 MG 24 hr tablet; Take 1 tablet (300 mg total) by mouth daily.  PTSD (post-traumatic stress disorder) -     doxazosin (CARDURA) 4 MG tablet; Take 1 tablet (4 mg total) by mouth at bedtime. -     sertraline (ZOLOFT) 100 MG tablet; Take 1.5 tablets (150 mg total) by mouth daily. -     ALPRAZolam (XANAX) 0.5 MG tablet; Take 1/2-1 tab po BID prn anxiety/panic  Insomnia due to other mental disorder -     traZODone (DESYREL) 50 MG tablet; Take 1/2-1 tab po QHS prn insomnia     Please see After Visit Summary for patient specific instructions.  Future Appointments  Date Time Provider Department Center  01/03/2022  2:00 PM Stevphen Meuse, [facility] CP-CP None  01/17/2022 10:00 AM Stevphen Meuse, [facility] CP-CP None  06/19/2022 10:00 AM Corie Chiquito, PMHNP CP-CP None    No orders of the defined types were placed in this encounter.   -------------------------------

## 2021-12-28 DIAGNOSIS — R109 Unspecified abdominal pain: Secondary | ICD-10-CM | POA: Diagnosis not present

## 2022-01-03 ENCOUNTER — Ambulatory Visit: Payer: 59 | Admitting: Psychiatry

## 2022-01-03 DIAGNOSIS — R69 Illness, unspecified: Secondary | ICD-10-CM | POA: Diagnosis not present

## 2022-01-03 DIAGNOSIS — F4310 Post-traumatic stress disorder, unspecified: Secondary | ICD-10-CM

## 2022-01-03 NOTE — Progress Notes (Signed)
[facility] Counselor/Therapist Progress Note  Patient ID: Samamtha Tiegs, MRN: [id number],    Date: 01/03/2022  Time Spent: 50 minutes start time 2:09 PM end time 2:59 PM  Treatment Type: Individual Therapy  Reported Symptoms: fatigue, pain issues, anxiety, triggered responses,  nightmares  Mental Status Exam:  Appearance:   Well Groomed     Behavior:  Appropriate  Motor:  Normal  Speech/Language:   Normal Rate  Affect:  Appropriate  Mood:  anxious  Thought process:  normal  Thought content:    WNL  Sensory/Perceptual disturbances:    WNL  Orientation:  oriented to person, place, time/date, and situation  Attention:  Good  Concentration:  Good  Memory:  WNL  Fund of knowledge:   Good  Insight:    Good  Judgment:   Good  Impulse Control:  Good   Risk Assessment: Danger to Self:  No Self-injurious Behavior: No Danger to Others: No Duty to Warn:no Physical Aggression / Violence:No  Access to Firearms a concern: No  Gang Involvement:No   Subjective: Patient was present for session. She shared she had her hysterectomy and is recovering.  She shared she is having fatigue so she knows it will still be a while for her.  She is not going anywhere for Christmas so that she can recover. She shared that her dad is doing better and his wound is almost closed.  Patient shared 1 of her daughter's friends is staying with them and she had issues like she did with her dad and that has been triggering nightmares for her.  Patient reported she did not feel there was anything she wanted to work through at this moment but was able to acknowledge how she is feeling.  Discussed the importance of using what she is learned and treatment to keep herself grounded and to work through her issues with a girl that is living with them.  Discussed getting Dr. Rayfield Citizen leaf's book on helping your child with their mental mess to work through with her children and her children's friends because she could  utilize tools as well.  Patient also had shared that there had been another incident with her daughter at the school.  Encouraged her to continue looking at other options for school for her daughter since that seems to be an unsafe situation for her currently.  Interventions: Cognitive Behavioral Therapy and Solution-Oriented/Positive Psychology  Diagnosis:   ICD-10-CM   1. PTSD (post-traumatic stress disorder)  F43.10       Plan:  Patient is to use CBT and coping skills to continue decreasing triggered responses.  Patient is to get Dr. Rayfield Citizen leaf's book on helping your child clean up their mental mess so that she and her children can start practicing the exercises to help change the neuro cycle.  Patient is to work on listening some brain spotting bilateral music and doing a 3-minute journaling exercise prior to bedtime to see if that helps decrease nightmares.  Patient is to work on self talk to remind herself that her she is enough.  Patient is to exercise to release negative emotions appropriately.  Patient is to use acupuncture as a way to reduce stress.   Patient is to take medication as directed.  Patient is to continue working with providers on medical issues.   Long term goal: Develop and implement effective coping skills that allow for carrying out normal responsibilities and participating relationships and social activities. Short term goal: Practice, implement relaxation training  as a coping  Mechanism for tension, panic, stress, anger and anxiety  Stevphen Meuse, [facility]

## 2022-01-17 ENCOUNTER — Ambulatory Visit: Payer: 59 | Admitting: Psychiatry

## 2022-01-17 DIAGNOSIS — F4310 Post-traumatic stress disorder, unspecified: Secondary | ICD-10-CM

## 2022-01-17 DIAGNOSIS — R69 Illness, unspecified: Secondary | ICD-10-CM | POA: Diagnosis not present

## 2022-01-17 NOTE — Progress Notes (Signed)
[facility] Counselor/Therapist Progress Note  Patient ID: [name], MRN: [id number],    Date: 01/17/2022  Time Spent: 50 minutes start time 10:06 AM end time 10:56 AM  Treatment Type: Individual Therapy  Reported Symptoms: fatigue, nightmares, sleep issues, triggered responses, anxiety  Mental Status Exam:  Appearance:   Well Groomed     Behavior:  Appropriate  Motor:  Normal  Speech/Language:   Normal Rate  Affect:  Appropriate  Mood:  anxious  Thought process:  normal  Thought content:    WNL  Sensory/Perceptual disturbances:    WNL  Orientation:  oriented to person, place, time/date, and situation  Attention:  Good  Concentration:  Good  Memory:  WNL  Fund of knowledge:   Good  Insight:    Good  Judgment:   Good  Impulse Control:  Good   Risk Assessment: Danger to Self:  No Self-injurious Behavior: No Danger to Others: No Duty to Warn:no Physical Aggression / Violence:No  Access to Firearms a concern: No  Gang Involvement:No   Subjective: Patient was present for session. She reported she feels that she is recovering well from surgery.  She stated that her nightmares are coming back and she is not sure why that is. They started to come back before the surgery.  Patient went on to share that recently her nightmares have come back and that is the biggest issue she wanted to address in session.  Processed nightmare Greig Castilla and patient trying to get out, suds level 7, negative cognition "I am trapped" felt powerless in her stomach.  Patient was able to reduce suds level to 3.  She was able to recognize that as a child she was bullied and no one besides her grandfather did anything about that and that was getting triggered by what happened to her daughter in school.  Patient was also able to recognize that she was able to advocate for her daughter and was trying to make sure she knew she was supported and not alone in the situation.  Patient was encouraged to remind  herself of that and to recognize all the ways that she has handled things different than her parents so she can release some of the negative things from the past.  Interventions: Solution-Oriented/Positive Psychology, Eye Movement Desensitization and Reprocessing (EMDR), and Insight-Oriented  Diagnosis:   ICD-10-CM   1. PTSD (post-traumatic stress disorder)  F43.10       Plan: Patient is to use CBT and coping skills to continue decreasing triggered responses.  Patient is to get Dr. Rayfield Citizen leaf's book on helping your child clean up their mental mess so that she and her children can start practicing the exercises to help change the neuro cycle.  Patient is to work on listening some brain spotting bilateral music and doing a 3-minute journaling exercise prior to bedtime to see if that helps decrease nightmares.  Patient is to work on self talk to remind herself that her she is enough.  Patient is to exercise to release negative emotions appropriately.  Patient is to use acupuncture as a way to reduce stress.   Patient is to take medication as directed.  Patient is to continue working with providers on medical issues.   Long term goal: Develop and implement effective coping skills that allow for carrying out normal responsibilities and participating relationships and social activities. Short term goal: Practice, implement relaxation training as a coping  Mechanism for tension, panic, stress, anger and anxiety  [address]  Dalbert Batman, [facility]

## 2022-01-23 DIAGNOSIS — R109 Unspecified abdominal pain: Secondary | ICD-10-CM | POA: Diagnosis not present

## 2022-02-19 ENCOUNTER — Ambulatory Visit: Payer: 59 | Admitting: Psychiatry

## 2022-02-19 DIAGNOSIS — F4310 Post-traumatic stress disorder, unspecified: Secondary | ICD-10-CM | POA: Diagnosis not present

## 2022-02-19 DIAGNOSIS — R69 Illness, unspecified: Secondary | ICD-10-CM | POA: Diagnosis not present

## 2022-02-19 NOTE — Progress Notes (Signed)
      [facility] Counselor/Therapist Progress Note  Patient ID: [name], MRN: [id number],    Date: 02/19/2022  Time Spent: 50 minutes start time 2:06 PM end time 2:56 PM  Treatment Type: Individual Therapy  Reported Symptoms: sadness, anxiety, triggered responses, sleep issues  Mental Status Exam:  Appearance:   Well Groomed     Behavior:  Appropriate  Motor:  Normal  Speech/Language:   Normal Rate  Affect:  Appropriate  Mood:  anxious  Thought process:  normal  Thought content:    WNL  Sensory/Perceptual disturbances:    WNL  Orientation:  oriented to person, place, time/date, and situation  Attention:  Good  Concentration:  Good  Memory:  WNL  Fund of knowledge:   Good  Insight:    Good  Judgment:   Good  Impulse Control:  Good   Risk Assessment: Danger to Self:  No Self-injurious Behavior: No Danger to Others: No Duty to Warn:no Physical Aggression / Violence:No  Access to Firearms a concern: No  Gang Involvement:No   Subjective: Patient was present for session.  She shared that her dog passed away and that has been hard.  She explained that the holidays had gone okay and it was nice not to have to go to her in-laws.  She went on to share that her husband has been irritable lately and that has been creating anxiety for her.  She shared that recently he told her he was always annoyed.  Did processing set on that suds level 5, negative cognition "I am not wanted" felt sadness in her stomach.  Patient was able to reduce suds level to 2.  She was able to recognize that they had gotten into some patterns that need to be changed but that it is not all her that needs to do the changing.  She was able to recognize that he has difficulty saying what he needs to do for himself and that she needs to talk to him about ways that they can have some time together and he can have some healthy releases rather than just being underweight.  Interventions: Eye Movement  Desensitization and Reprocessing (EMDR) and Insight-Oriented  Diagnosis:   ICD-10-CM   1. PTSD (post-traumatic stress disorder)  F43.10       Plan: Patient is to use CBT and coping skills to continue decreasing triggered responses.  Patient is to get Dr. Chrys Racer leaf's book on helping your child clean up their mental mess so that she and her children can start practicing the exercises to help change the neuro cycle.  Patient is to work on listening some brain spotting bilateral music and doing a 3-minute journaling exercise prior to bedtime to see if that helps decrease nightmares.  Patient is to work on self talk to remind herself that her she is enough.  Patient is to exercise to release negative emotions appropriately.  Patient is to use acupuncture as a way to reduce stress.   Patient is to take medication as directed.  Patient is to continue working with providers on medical issues.   Long term goal: Develop and implement effective coping skills that allow for carrying out normal responsibilities and participating relationships and social activities. Short term goal: Practice, implement relaxation training as a coping  Mechanism for tension, panic, stress, anger and anxiety  [name], [facility]

## 2022-03-08 ENCOUNTER — Ambulatory Visit: Payer: 59 | Admitting: Psychiatry

## 2022-03-08 DIAGNOSIS — R69 Illness, unspecified: Secondary | ICD-10-CM | POA: Diagnosis not present

## 2022-03-08 DIAGNOSIS — F4310 Post-traumatic stress disorder, unspecified: Secondary | ICD-10-CM

## 2022-03-08 NOTE — Progress Notes (Signed)
      [facility] Counselor/Therapist Progress Note  Patient ID: [name], MRN: [id number],    Date: 03/08/2022  Time Spent: 52 minutes start time 10:58 AM end time 11 AM  Treatment Type: Individual Therapy  Reported Symptoms: anxiety, sleep issues, nightmares, triggered responses  Mental Status Exam:  Appearance:   Well Groomed     Behavior:  Sharing  Motor:  Normal  Speech/Language:   Normal Rate  Affect:  Appropriate  Mood:  normal  Thought process:  normal  Thought content:    WNL  Sensory/Perceptual disturbances:    WNL  Orientation:  oriented to person, place, time/date, and situation  Attention:  Good  Concentration:  Good  Memory:  WNL  Fund of knowledge:   Good  Insight:    Good  Judgment:   Good  Impulse Control:  Good   Risk Assessment: Danger to Self:  No Self-injurious Behavior: No Danger to Others: No Duty to Warn:no Physical Aggression / Violence:No  Access to Firearms a concern: No  Gang Involvement:No   Subjective: Patient was present for session. She shared that things are busy right now with managing all the schedules of the kids. She is having physical intimacy with her husband without issues which is progress.  She is still missing her dog that passed and that has been hard.  Patient went on to share that she is feeling much better about how she is handling things.  She reported her biggest concern currently is her husband's mood because he has been irritable which is not like him.  Patient was able to think through some of the things going on with him and recognized that part of the issue may be his realization of his mother's choices which have led him to disconnecting from that relationship.  Patient was encouraged to stay focused on what she can do something about and to recognize she may need to give her husband spaces he works through things with his mother.  Patient was encouraged to continue doing what is working for her.  Interventions:  Solution-Oriented/Positive Psychology  Diagnosis:   ICD-10-CM   1. PTSD (post-traumatic stress disorder)  F43.10       Plan: Patient is to use CBT and coping skills to continue decreasing triggered responses.  Patient is to get Dr. Chrys Racer leaf's book on helping your child clean up their mental mess so that she and her children can start practicing the exercises to help change the neuro cycle.  Patient is to work on listening some brain spotting bilateral music and doing a 3-minute journaling exercise prior to bedtime to see if that helps decrease nightmares.  Patient is to work on self talk to remind herself that her she is enough.  Patient is to exercise to release negative emotions appropriately.  Patient is to use acupuncture as a way to reduce stress.   Patient is to take medication as directed.  Patient is to continue working with providers on medical issues.   Long term goal: Develop and implement effective coping skills that allow for carrying out normal responsibilities and participating relationships and social activities. Short term goal: Practice, implement relaxation training as a coping  Mechanism for tension, panic, stress, anger and anxiety  [name], [facility]

## 2022-03-22 ENCOUNTER — Ambulatory Visit: Payer: 59 | Admitting: Psychiatry

## 2022-03-22 DIAGNOSIS — F4310 Post-traumatic stress disorder, unspecified: Secondary | ICD-10-CM

## 2022-03-22 NOTE — Progress Notes (Signed)
[facility] Counselor/Therapist Progress Note  Patient ID: [name], MRN: [id number],    Date: 03/22/2022  Time Spent: 50 minutes Start time 10:08 AM  Treatment Type: Individual Therapy  Reported Symptoms: fatigue, nightmares, anxiety, triggered responses, focusing issues  Mental Status Exam:  Appearance:   Casual     Behavior:  Appropriate  Motor:  Normal  Speech/Language:   Normal Rate  Affect:  Appropriate  Mood:  anxious  Thought process:  normal  Thought content:    WNL  Sensory/Perceptual disturbances:    WNL  Orientation:  oriented to person, place, time/date, and situation  Attention:  Good  Concentration:  Good  Memory:  WNL  Fund of knowledge:   Good  Insight:    Good  Judgment:   Good  Impulse Control:  Good   Risk Assessment: Danger to Self:  No Self-injurious Behavior: No Danger to Others: No Duty to Warn:no Physical Aggression / Violence:No  Access to Firearms a concern: No  Gang Involvement:No   Subjective: Patient was present for session.  Patient reported that overall she was doing well.  She was managing things with her husband and her kids even though there had been some stress.  Patient was able to share a few different situations that were triggering for her.  Patient finally was able to recognize that her grandmother was the biggest concern currently.  She shared that her health is going down and she has been avoiding dealing with the situation.  Encouraged patient to think about what she needs to be okay for when her grandmother passes.  She was able to recognize she does need to have a conversation with her and see her again before that time.  Patient was encouraged to think through what was stopping her.  Patient was finally able to recognize she is coming up with lots of excuses because she is fearful of what she will see.  Patient was encouraged to think about ways to talk herself through that anxiety so that she can plan a trip to see  her grandmother.  Patient was also encouraged to talk with her husband about her concerns and her needs.  Interventions: Solution-Oriented/Positive Psychology  Diagnosis:   ICD-10-CM   1. PTSD (post-traumatic stress disorder)  F43.10       Plan: Patient is to use CBT and coping skills to continue decreasing triggered responses.  Patient is to get Dr. Chrys Racer leaf's book on helping your child clean up their mental mess so that she and her children can start practicing the exercises to help change the neuro cycle.  Patient is to work on listening some brain spotting bilateral music and doing a 3-minute journaling exercise prior to bedtime to see if that helps decrease nightmares.  Patient is to work on self talk to remind herself that her she is enough.  Patient is to exercise to release negative emotions appropriately.  Patient is to use acupuncture as a way to reduce stress.   Patient is to take medication as directed.  Patient is to continue working with providers on medical issues.   Long term goal: Develop and implement effective coping skills that allow for carrying out normal responsibilities and participating relationships and social activities. Short term goal: Practice, implement relaxation training as a coping  Mechanism for tension, panic, stress, anger and anxiety    Lina Sayre, [facility]

## 2022-04-04 DIAGNOSIS — R110 Nausea: Secondary | ICD-10-CM | POA: Diagnosis not present

## 2022-04-04 DIAGNOSIS — R1032 Left lower quadrant pain: Secondary | ICD-10-CM | POA: Diagnosis not present

## 2022-04-04 DIAGNOSIS — R140 Abdominal distension (gaseous): Secondary | ICD-10-CM | POA: Diagnosis not present

## 2022-04-05 ENCOUNTER — Ambulatory Visit: Payer: 59 | Admitting: Psychiatry

## 2022-04-13 DIAGNOSIS — K640 First degree hemorrhoids: Secondary | ICD-10-CM | POA: Diagnosis not present

## 2022-04-13 DIAGNOSIS — Z1211 Encounter for screening for malignant neoplasm of colon: Secondary | ICD-10-CM | POA: Diagnosis not present

## 2022-04-13 DIAGNOSIS — K3189 Other diseases of stomach and duodenum: Secondary | ICD-10-CM | POA: Diagnosis not present

## 2022-04-13 DIAGNOSIS — K648 Other hemorrhoids: Secondary | ICD-10-CM | POA: Diagnosis not present

## 2022-04-13 DIAGNOSIS — R140 Abdominal distension (gaseous): Secondary | ICD-10-CM | POA: Diagnosis not present

## 2022-04-13 DIAGNOSIS — K2950 Unspecified chronic gastritis without bleeding: Secondary | ICD-10-CM | POA: Diagnosis not present

## 2022-04-13 DIAGNOSIS — R110 Nausea: Secondary | ICD-10-CM | POA: Diagnosis not present

## 2022-04-13 DIAGNOSIS — R1032 Left lower quadrant pain: Secondary | ICD-10-CM | POA: Diagnosis not present

## 2022-04-13 DIAGNOSIS — K449 Diaphragmatic hernia without obstruction or gangrene: Secondary | ICD-10-CM | POA: Diagnosis not present

## 2022-04-24 ENCOUNTER — Ambulatory Visit: Payer: 59 | Admitting: Psychiatry

## 2022-04-24 DIAGNOSIS — F4310 Post-traumatic stress disorder, unspecified: Secondary | ICD-10-CM

## 2022-04-24 NOTE — Progress Notes (Unsigned)
[facility] Counselor/Therapist Progress Note  Patient ID: [name], MRN: [id number],    Date: 04/24/2022  Time Spent: 49 minutes start time 10:11 AM end time 11:00 AM  Treatment Type: Individual Therapy  Reported Symptoms: anxiety, flashbacks, sleep issues, nightmares, triggered responses, rumination  Mental Status Exam:  Appearance:   Well Groomed     Behavior:  Appropriate  Motor:  Normal  Speech/Language:   Normal Rate  Affect:  Appropriate  Mood:  anxious  Thought process:  normal  Thought content:    WNL  Sensory/Perceptual disturbances:    WNL  Orientation:  oriented to person, place, time/date, and situation  Attention:  Good  Concentration:  Good  Memory:  WNL  Fund of knowledge:   Good  Insight:    Good  Judgment:   Good  Impulse Control:  Good   Risk Assessment: Danger to Self:  No Self-injurious Behavior: No Danger to Others: No Duty to Warn:no Physical Aggression / Violence:No  Access to Firearms a concern: No  Gang Involvement:No   Subjective: Patient was present for session.  She stated that she is dealing with lots of things with her children but it is okay. She went on to share that they are happy so that is making things easier. She went on to share that her grandmother is continuing to decline but she is talking with her more and sending her cards so that is helping her feel better. Her mother had knee surgery and her dad is getting ready for knee surgery so she is feeling lots of different emotions.Allowed patient time to think through how she was feeling about their issues.  She went on to share that her daughter is having lots of her friends over and they are eating things without permission. Encouraged her to set some limits with them. Patient reported that she is noticing nightmares about getting bullied at school.  She explained she had to move several times as a child and wasn't able to form relationships the way she would have liked to.   She is happy that her children have been able to have those relationships and as that is happening is affirming to herself that it can happen.  Patient shared that she often feels that she is difficult and that her husband might leave her.  Encouraged her to recognize all she does for her husband and the things that her quirky about him especially with his eating issues that she excepts.  As she was able to think through things that she was able to recognize he is also lucky that she stays with him it is not just a one-sided relationship.  Encouraged her to remind herself of that truth.  Interventions: Cognitive Behavioral Therapy and Solution-Oriented/Positive Psychology  Diagnosis:   ICD-10-CM   1. PTSD (post-traumatic stress disorder)  F43.10       Plan:  Patient is to use CBT and coping skills to continue decreasing triggered responses.  Patient is to get Dr. Chrys Racer leaf's book on helping your child clean up their mental mess so that she and her children can start practicing the exercises to help change the neuro cycle.  Patient is to work on listening some brain spotting bilateral music and doing a 3-minute journaling exercise prior to bedtime to see if that helps decrease nightmares.  Patient is to work on self talk to remind herself that her she is enough.  Patient is to exercise to release negative emotions appropriately.  Patient is to use acupuncture as a way to reduce stress.   Patient is to take medication as directed.  Patient is to continue working with providers on medical issues.   Long term goal: Develop and implement effective coping skills that allow for carrying out normal responsibilities and participating relationships and social activities. Short term goal: Practice, implement relaxation training as a coping  Mechanism for tension, panic, stress, anger and anxiety    Lina Sayre, [facility]

## 2022-05-04 DIAGNOSIS — R1084 Generalized abdominal pain: Secondary | ICD-10-CM | POA: Diagnosis not present

## 2022-05-04 DIAGNOSIS — M549 Dorsalgia, unspecified: Secondary | ICD-10-CM | POA: Diagnosis not present

## 2022-05-04 DIAGNOSIS — W5512XA Struck by horse, initial encounter: Secondary | ICD-10-CM | POA: Diagnosis not present

## 2022-05-04 DIAGNOSIS — Z6831 Body mass index (BMI) 31.0-31.9, adult: Secondary | ICD-10-CM | POA: Diagnosis not present

## 2022-05-14 ENCOUNTER — Ambulatory Visit (INDEPENDENT_AMBULATORY_CARE_PROVIDER_SITE_OTHER): Payer: 59 | Admitting: Psychiatry

## 2022-05-14 ENCOUNTER — Telehealth: Payer: Self-pay | Admitting: Psychiatry

## 2022-05-14 DIAGNOSIS — F4310 Post-traumatic stress disorder, unspecified: Secondary | ICD-10-CM | POA: Diagnosis not present

## 2022-05-14 NOTE — Telephone Encounter (Signed)
Ms. anvi, daigler are scheduled for a virtual visit with your provider today.    Just as we do with appointments in the office, we must obtain your consent to participate.  Your consent will be active for this visit and any virtual visit you may have with one of our providers in the next 365 days.    If you have a MyChart account, I can also send a copy of this consent to you electronically.  All virtual visits are billed to your insurance company just like a traditional visit in the office.  As this is a virtual visit, video technology does not allow for your provider to perform a traditional examination.  This may limit your provider's ability to fully assess your condition.  If your provider identifies any concerns that need to be evaluated in person or the need to arrange testing such as labs, EKG, etc, we will make arrangements to do so.    Although advances in technology are sophisticated, we cannot ensure that it will always work on either your end or our end.  If the connection with a video visit is poor, we may have to switch to a telephone visit.  With either a video or telephone visit, we are not always able to ensure that we have a secure connection.   I need to obtain your verbal consent now.   Are you willing to proceed with your visit today?   Olympia Brodman has provided verbal consent on 05/14/2022 for a virtual visit (video or telephone).   Stevphen Meuse, [facility] 05/14/2022  9:08 AM
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
## 2022-05-21 DIAGNOSIS — R1032 Left lower quadrant pain: Secondary | ICD-10-CM | POA: Diagnosis not present

## 2022-05-21 DIAGNOSIS — G8929 Other chronic pain: Secondary | ICD-10-CM | POA: Diagnosis not present

## 2022-05-21 DIAGNOSIS — N200 Calculus of kidney: Secondary | ICD-10-CM | POA: Diagnosis not present

## 2022-05-21 DIAGNOSIS — N1330 Unspecified hydronephrosis: Secondary | ICD-10-CM | POA: Diagnosis not present

## 2022-05-22 DIAGNOSIS — G8929 Other chronic pain: Secondary | ICD-10-CM | POA: Diagnosis not present

## 2022-05-22 DIAGNOSIS — K219 Gastro-esophageal reflux disease without esophagitis: Secondary | ICD-10-CM | POA: Diagnosis not present

## 2022-05-22 DIAGNOSIS — R1032 Left lower quadrant pain: Secondary | ICD-10-CM | POA: Diagnosis not present

## 2022-05-23 ENCOUNTER — Emergency Department (HOSPITAL_BASED_OUTPATIENT_CLINIC_OR_DEPARTMENT_OTHER)
Admission: EM | Admit: 2022-05-23 | Discharge: 2022-05-23 | Disposition: A | Discharge status: Home / Self Care | Payer: 59 | Attending: Emergency Medicine | Admitting: Emergency Medicine

## 2022-05-23 ENCOUNTER — Other Ambulatory Visit: Payer: Self-pay

## 2022-05-23 ENCOUNTER — Emergency Department (HOSPITAL_BASED_OUTPATIENT_CLINIC_OR_DEPARTMENT_OTHER): Payer: 59

## 2022-05-23 ENCOUNTER — Encounter (HOSPITAL_BASED_OUTPATIENT_CLINIC_OR_DEPARTMENT_OTHER): Payer: Self-pay | Admitting: Emergency Medicine

## 2022-05-23 DIAGNOSIS — G8929 Other chronic pain: Secondary | ICD-10-CM

## 2022-05-23 DIAGNOSIS — J90 Pleural effusion, not elsewhere classified: Secondary | ICD-10-CM | POA: Insufficient documentation

## 2022-05-23 DIAGNOSIS — R0789 Other chest pain: Secondary | ICD-10-CM | POA: Diagnosis not present

## 2022-05-23 NOTE — ED Provider Notes (Signed)
[facility] [facility] Provider Note   CSN: [id number] Arrival date & time: 05/23/22  1719     History  Chief Complaint  Patient presents with   Pleural Effusion    [name] is a 47 y.o. female, history of anxiety, chronic left upper quadrant pain, who presents to the ED secondary to concern for bilateral pleural effusion seen on her CAT scan on 4/22.  She was called by the GI doctor, and told to get further evaluation for his her bilateral effusions of her pleura.  She denies any shortness of breath, but does endorse chronic chest pain.  She states her chest pain is random at times, and is associated with some back pain, but in no vomiting.  She has no radiation of the pain, and is localized to the right upper chest.  Denies any dyspnea on exertion or at rest.  States it is just kind of achy and throbs when it occurs.  Denies any abdominal pain with the episodes.     Home Medications Prior to Admission medications   Medication Sig Start Date End Date Taking? Authorizing Provider  acetaminophen (TYLENOL) 500 MG tablet Take 2 tablets (1,000 mg total) by mouth every 6 (six) hours. Patient not taking: Reported on 12/26/2021 12/08/21   Huel Cote, MD  ALPRAZolam Prudy Feeler) 0.5 MG tablet Take 1/2-1 tab po BID prn anxiety/panic 01/05/22   Corie Chiquito, PMHNP  APPLE CIDER VINEGAR PO Take by mouth.    [provider]  ARIPiprazole (ABILIFY) 5 MG tablet Take 1 tablet (5 mg total) by mouth daily. TAKE 1/2-1 TABLET BY MOUTH ONCE DAILY 12/26/21   Corie Chiquito, PMHNP  buPROPion (WELLBUTRIN XL) 300 MG 24 hr tablet Take 1 tablet (300 mg total) by mouth daily. 12/26/21   Corie Chiquito, PMHNP  Calcium Carb-Cholecalciferol (CALCIUM 500 + D PO) Take by mouth.    [provider]  doxazosin (CARDURA) 4 MG tablet Take 1 tablet (4 mg total) by mouth at bedtime. 12/26/21   Corie Chiquito, PMHNP  ibuprofen (ADVIL) 600 MG tablet Take 1 tablet  (600 mg total) by mouth every 6 (six) hours. Patient not taking: Reported on 12/26/2021 12/07/21   Huel Cote, MD  MAGNESIUM PO Take by mouth daily.    [provider]  Multiple Vitamins-Minerals (MULTIVITAMIN PO) Take by mouth daily.    [provider]  Omega-3 Fatty Acids (FISH OIL) 1000 MG CAPS Take by mouth.    [provider]  omeprazole (PRILOSEC OTC) 20 MG tablet Take 20 mg by mouth daily as needed.    [provider]  oxyCODONE (OXY IR/ROXICODONE) 5 MG immediate release tablet Take 1-2 tablets (5-10 mg total) by mouth every 4 (four) hours as needed for moderate pain. Patient not taking: Reported on 12/26/2021 12/07/21   Huel Cote, MD  Probiotic Product (PROBIOTIC PO) Take by mouth.    [provider]  sertraline (ZOLOFT) 100 MG tablet Take 1.5 tablets (150 mg total) by mouth daily. 12/26/21 06/24/22  Corie Chiquito, PMHNP  traZODone (DESYREL) 50 MG tablet Take 1/2-1 tab po QHS prn insomnia 12/26/21   Corie Chiquito, PMHNP      Allergies    Meloxicam and Penicillins    Review of Systems   Review of Systems  Respiratory:  Negative for shortness of breath.   Cardiovascular:  Positive for chest pain.    Physical Exam Updated Vital Signs BP (!) 118/58 (BP Location: Right Arm)   Pulse 92  Temp 98 F (36.7 C) (Oral)   Resp 16   Ht  (1.575 m)   Wt 79.8 kg   LMP 11/03/2021 (Exact Date)   SpO2 97%   BMI 32.19 kg/m  Physical Exam Vitals and nursing note reviewed.  Constitutional:      General: She is not in acute distress.    Appearance: She is well-developed.  HENT:     Head: Normocephalic and atraumatic.  Eyes:     Conjunctiva/sclera: Conjunctivae normal.  Cardiovascular:     Rate and Rhythm: Normal rate and regular rhythm.     Heart sounds: No murmur heard. Pulmonary:     Effort: Pulmonary effort is normal. No respiratory distress.     Breath sounds: Normal breath sounds.  Abdominal:     Palpations:  Abdomen is soft.     Tenderness: There is no abdominal tenderness.  Musculoskeletal:        General: No swelling.     Cervical back: Neck supple.  Skin:    General: Skin is warm and dry.     Capillary Refill: Capillary refill takes less than 2 seconds.  Neurological:     Mental Status: She is alert.  Psychiatric:        Mood and Affect: Mood normal.     ED Results / Procedures / Treatments   Labs (all labs ordered are listed, but only abnormal results are displayed) Labs Reviewed - No data to display  EKG None  Radiology No results found.  Procedures Procedures    Medications Ordered in ED Medications - No data to display  ED Course/ Medical Decision Making/ A&P                             Medical Decision Making Patient is a 47 year old female, history of anxiety, here for bilateral pleural effusions, seen on CT told to come in by GI.  She has no shortness of breath, or new chest pain.  She states she has chronic chest pain, that is intermittent, and radiates to her back.  We discussed, chest pain workup, and she declined except for an EKG.  We will obtain an EKG, and further discuss.  She is aware of risk associated with not obtaining labs, further etiology.  Amount and/or Complexity of Data Reviewed Labs: ordered.    Details: Declined Radiology: ordered.    Details: Declined ECG/medicine tests:     Details: Normal sinus rhythm Discussion of management or test interpretation with external provider(s): Discussed with patient, her EKG is fairly reassuring, there are some abnormal T wave changes, however pain has been chronic, and she is not currently having chest pain.  She denies any labs, chest x-ray, and we discussed this, and risk factors, and return precautions.  She voiced understanding, discharged home, close follow-up with primary care doctor.  Trace bilateral pleural effusions without new chest pain, or shortness of breath.  Is overall well-appearing and has  clear lungs to auscultation.   Final Clinical Impression(s) / ED Diagnoses Final diagnoses:  Chronic chest pain  Chronic bilateral pleural effusions    Rx / DC Orders ED Discharge Orders     None         Pete Pelt, PA 05/23/22 1823    Tegeler, Canary Brim, MD 05/23/22 [phone]

## 2022-05-23 NOTE — ED Triage Notes (Signed)
Pt reports she had a CT Abd/Pelvis on 4/22 for chronic (2 yrs) LLQ pain; CT showed "fluid" on her lungs; GI told her she needed to follow up about this; denies sxs; NAD in triage

## 2022-05-23 NOTE — Discharge Instructions (Signed)
Please follow-up with your primary care doctor, if you do not have a primary care doctor, please call the number below to help establish 1.  Return to the ER if you have worsening chest pain, shortness of breath.

## 2022-05-23 NOTE — ED Notes (Signed)
Patient denied any shortness of breath upon discharge. No pain in her chest and no other issues or complaints.

## 2022-06-05 ENCOUNTER — Ambulatory Visit (INDEPENDENT_AMBULATORY_CARE_PROVIDER_SITE_OTHER): Payer: 59 | Admitting: Psychiatry

## 2022-06-05 DIAGNOSIS — F4310 Post-traumatic stress disorder, unspecified: Secondary | ICD-10-CM
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
## 2022-06-19 ENCOUNTER — Ambulatory Visit: Payer: 59 | Admitting: Psychiatry

## 2022-06-27 ENCOUNTER — Ambulatory Visit: Payer: 59 | Admitting: Psychiatry

## 2022-06-27 DIAGNOSIS — F4310 Post-traumatic stress disorder, unspecified: Secondary | ICD-10-CM
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
## 2022-07-11 ENCOUNTER — Ambulatory Visit: Payer: 59 | Admitting: Psychiatry

## 2022-07-23 ENCOUNTER — Ambulatory Visit (INDEPENDENT_AMBULATORY_CARE_PROVIDER_SITE_OTHER): Payer: 59 | Admitting: Psychiatry

## 2022-07-23 ENCOUNTER — Encounter: Payer: Self-pay | Admitting: Psychiatry

## 2022-07-23 DIAGNOSIS — F99 Mental disorder, not otherwise specified: Secondary | ICD-10-CM | POA: Diagnosis not present

## 2022-07-23 DIAGNOSIS — F5105 Insomnia due to other mental disorder: Secondary | ICD-10-CM

## 2022-07-23 DIAGNOSIS — F3341 Major depressive disorder, recurrent, in partial remission: Secondary | ICD-10-CM

## 2022-07-23 DIAGNOSIS — F4310 Post-traumatic stress disorder, unspecified: Secondary | ICD-10-CM

## 2022-07-23 DIAGNOSIS — Z0289 Encounter for other administrative examinations: Secondary | ICD-10-CM

## 2022-07-23 MED ORDER — BUPROPION HCL ER (XL) 300 MG PO TB24: 300.0000 mg | ORAL_TABLET | Freq: Every day | ORAL | 1 refills | Status: DC

## 2022-07-23 MED ORDER — TRAZODONE HCL 50 MG PO TABS
ORAL_TABLET | ORAL | 2 refills | Status: DC
Start: 2022-07-23 — End: 2023-01-07

## 2022-07-23 MED ORDER — ALPRAZOLAM 0.5 MG PO TABS
ORAL_TABLET | ORAL | 2 refills | Status: DC
Start: 2022-07-23 — End: 2023-01-07

## 2022-07-23 MED ORDER — ARIPIPRAZOLE 5 MG PO TABS
5.0000 mg | ORAL_TABLET | Freq: Every day | ORAL | 1 refills | Status: DC
Start: 2022-07-23 — End: 2023-01-07

## 2022-07-23 MED ORDER — SERTRALINE HCL 100 MG PO TABS
150.0000 mg | ORAL_TABLET | Freq: Every day | ORAL | 1 refills | Status: DC
Start: 2022-07-23 — End: 2023-01-07

## 2022-07-23 MED ORDER — DOXAZOSIN MESYLATE 4 MG PO TABS: 4.0000 mg | ORAL_TABLET | Freq: Every day | ORAL | 1 refills | Status: DC
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
## 2022-07-31 ENCOUNTER — Ambulatory Visit (INDEPENDENT_AMBULATORY_CARE_PROVIDER_SITE_OTHER): Payer: 59 | Admitting: Psychiatry

## 2022-08-07 ENCOUNTER — Ambulatory Visit (INDEPENDENT_AMBULATORY_CARE_PROVIDER_SITE_OTHER): Payer: 59 | Admitting: Psychiatry

## 2022-08-07 DIAGNOSIS — F4310 Post-traumatic stress disorder, unspecified: Secondary | ICD-10-CM
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
## 2022-08-28 ENCOUNTER — Ambulatory Visit (INDEPENDENT_AMBULATORY_CARE_PROVIDER_SITE_OTHER): Payer: 59 | Admitting: Psychiatry

## 2022-08-28 DIAGNOSIS — F4310 Post-traumatic stress disorder, unspecified: Secondary | ICD-10-CM
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
## 2022-09-19 ENCOUNTER — Ambulatory Visit (INDEPENDENT_AMBULATORY_CARE_PROVIDER_SITE_OTHER): Payer: 59 | Admitting: Psychiatry

## 2022-09-19 DIAGNOSIS — F4310 Post-traumatic stress disorder, unspecified: Secondary | ICD-10-CM
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
## 2022-11-06 ENCOUNTER — Ambulatory Visit (INDEPENDENT_AMBULATORY_CARE_PROVIDER_SITE_OTHER): Payer: 59 | Admitting: Psychiatry

## 2022-11-06 DIAGNOSIS — F4310 Post-traumatic stress disorder, unspecified: Secondary | ICD-10-CM
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
## 2022-11-26 ENCOUNTER — Ambulatory Visit (INDEPENDENT_AMBULATORY_CARE_PROVIDER_SITE_OTHER): Payer: 59 | Admitting: Psychiatry

## 2022-11-26 DIAGNOSIS — F4310 Post-traumatic stress disorder, unspecified: Secondary | ICD-10-CM | POA: Diagnosis not present
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
## 2022-12-12 ENCOUNTER — Encounter: Payer: Self-pay | Admitting: Psychiatry

## 2023-01-03 ENCOUNTER — Other Ambulatory Visit: Payer: Self-pay | Admitting: Psychiatry

## 2023-01-03 DIAGNOSIS — F4310 Post-traumatic stress disorder, unspecified: Secondary | ICD-10-CM

## 2023-01-06 NOTE — Telephone Encounter (Signed)
Has appt. tomorrow

## 2023-01-07 ENCOUNTER — Ambulatory Visit (INDEPENDENT_AMBULATORY_CARE_PROVIDER_SITE_OTHER): Payer: 59 | Admitting: Psychiatry

## 2023-01-07 ENCOUNTER — Encounter: Payer: Self-pay | Admitting: Psychiatry

## 2023-01-07 ENCOUNTER — Other Ambulatory Visit: Payer: Self-pay | Admitting: Psychiatry

## 2023-01-07 DIAGNOSIS — F3341 Major depressive disorder, recurrent, in partial remission: Secondary | ICD-10-CM

## 2023-01-07 DIAGNOSIS — F99 Mental disorder, not otherwise specified: Secondary | ICD-10-CM | POA: Diagnosis not present

## 2023-01-07 DIAGNOSIS — F4310 Post-traumatic stress disorder, unspecified: Secondary | ICD-10-CM | POA: Diagnosis not present

## 2023-01-07 DIAGNOSIS — F5105 Insomnia due to other mental disorder: Secondary | ICD-10-CM | POA: Diagnosis not present

## 2023-01-07 MED ORDER — ALPRAZOLAM 0.5 MG PO TABS
ORAL_TABLET | ORAL | 5 refills | Status: AC
Start: 2023-01-07 — End: ?

## 2023-01-07 MED ORDER — ARIPIPRAZOLE 5 MG PO TABS: 5.0000 mg | ORAL_TABLET | Freq: Every day | ORAL | 2 refills | Status: DC

## 2023-01-07 MED ORDER — DOXAZOSIN MESYLATE 4 MG PO TABS
4.0000 mg | ORAL_TABLET | Freq: Every day | ORAL | 2 refills | Status: AC
Start: 2023-01-07 — End: ?

## 2023-01-07 MED ORDER — BUPROPION HCL ER (XL) 300 MG PO TB24
300.0000 mg | ORAL_TABLET | Freq: Every day | ORAL | 2 refills | Status: AC
Start: 2023-01-07 — End: ?

## 2023-01-07 MED ORDER — TRAZODONE HCL 50 MG PO TABS
ORAL_TABLET | ORAL | 2 refills | Status: AC
Start: 2023-01-07 — End: ?

## 2023-01-07 MED ORDER — SERTRALINE HCL 100 MG PO TABS: 150.0000 mg | ORAL_TABLET | Freq: Every day | ORAL | 2 refills | Status: AC

## 2023-01-07 NOTE — Progress Notes (Unsigned)
[name] [id number] 1975/03/29 47 y.o.  Subjective:   Patient ID:  [name] is a 47 y.o. (DOB 1975-08-26) female.  Chief Complaint:  Chief Complaint  Patient presents with   Follow-up    Anxiety, depression, and insomnia    HPI [name] presents to the office today for follow-up of anxiety, depression, and insomnia. She reports that her anxiety has been "ok." She reports panic attacks have been very infrequent. She reports that Xanax prn has been helpful with preventing panic attacks. She reports that PTSD have been manageable. She reports occasional flashbacks. Reports nightmares occur about once a week. She reports that falling and staying asleep has improved. She has been trying to maintain a consistent sleep schedule. Denies any recent depression or irritability. Energy and motivation have been good. Denies difficulty with concentration. Denies SI.   She reports that they will be seeing her in-laws briefly around the holidays.   Children are doing ok. Oldest child is taking some classes in interior design. Her middle child will graduate in [place] and will attend community college in August. Her youngest child is high school.    Xanax last filled 12/05/22.   Past Psychiatric Medication Trials: Effexor XR Wellbutrin- Causes GI upset in the morning. Prefers to take in the morning. Abilify Trazodone- Reports that she has taken it intermittently. Reports that 25 mg is ineffective. Sertraline Doxazosin- helpful for nightmares. Noticed some fatigue Prazosin- Not effective and caused Silenor- Reports that 3 mg was effective but cost prohibitive. Doxepin- Excessive somnolence Belsomra- Paradoxical effect Gabapentin- caused brain fog   AIMS    Flowsheet Row Office Visit from 07/23/2022 in [facility] Office Visit from 12/26/2021 in [facility]  AIMS Total Score 0 0      Flowsheet Row ED from 05/23/2022 in  [facility] Emergency Department at [facility] Admission (Discharged) from 12/07/2021 in [facility] ED from 01/18/2021 in [facility] Emergency Department at [facility]  C-SSRS RISK CATEGORY No Risk No Risk No Risk        Review of Systems:  Review of Systems  Gastrointestinal: Negative.   Musculoskeletal:  Negative for gait problem.  Neurological:  Negative for tremors.  Psychiatric/Behavioral:         Please refer to HPI    Medications: I have reviewed the patient's current medications.  Current Outpatient Medications  Medication Sig Dispense Refill   acetaminophen (TYLENOL) 500 MG tablet Take 2 tablets (1,000 mg total) by mouth every 6 (six) hours. (Patient not taking: Reported on 12/26/2021) 30 tablet 0   ALPRAZolam (XANAX) 0.5 MG tablet Take 1/2-1 tab po BID prn anxiety/panic 30 tablet 2   APPLE CIDER VINEGAR PO Take by mouth.     ARIPiprazole (ABILIFY) 5 MG tablet Take 1 tablet (5 mg total) by mouth daily. TAKE 1/2-1 TABLET BY MOUTH ONCE DAILY 90 tablet 1   buPROPion (WELLBUTRIN XL) 300 MG 24 hr tablet Take 1 tablet (300 mg total) by mouth daily. 90 tablet 1   Calcium Carb-Cholecalciferol (CALCIUM 500 + D PO) Take by mouth.     Cinnamon 500 MG TABS Take by mouth.     Cyanocobalamin (VITAMIN B 12 PO) Take by mouth.     doxazosin (CARDURA) 4 MG tablet Take 1 tablet (4 mg total) by mouth at bedtime. 90 tablet 1   ibuprofen (ADVIL) 600 MG tablet Take 1 tablet (600 mg total) by mouth every 6 (six) hours. (Patient not taking: Reported  on 12/26/2021) 30 tablet 0   MAGNESIUM PO Take by mouth daily.     Multiple Vitamins-Minerals (MULTIVITAMIN PO) Take by mouth daily.     Omega-3 Fatty Acids (FISH OIL) 1000 MG CAPS Take by mouth.     omeprazole (PRILOSEC OTC) 20 MG tablet Take 20 mg by mouth daily as needed.     oxyCODONE (OXY IR/ROXICODONE) 5 MG immediate release tablet Take 1-2 tablets (5-10 mg total) by mouth every 4 (four) hours as needed for moderate pain.  (Patient not taking: Reported on 12/26/2021) 20 tablet 0   Probiotic Product (PROBIOTIC PO) Take by mouth.     sertraline (ZOLOFT) 100 MG tablet Take 1.5 tablets (150 mg total) by mouth daily. 135 tablet 1   traZODone (DESYREL) 50 MG tablet Take 1/2-1 tab po QHS prn insomnia 90 tablet 2   No current facility-administered medications for this visit.    Medication Side Effects: None  Allergies:  Allergies  Allergen Reactions   Meloxicam Nausea And Vomiting   Penicillins Nausea And Vomiting    Past Medical History:  Diagnosis Date   Anxiety    Follows with PMHNP, Corie Chiquito, LOV 08/23/21 in Epic.   Back pain    chronic low back pain   Chronic hip pain 2019   Depression    Follows with PMHNP, Corie Chiquito, LOV 08/23/21 in Epic.   GERD (gastroesophageal reflux disease)    Hepatic steatosis 06/2020   found on CT scan   History of kidney stones    2021 & 2022   Nerve entrapment syndrome    anterior cutaneous nerve entrapment syndrom, hx of nerve blocks, most recent 09/05/21, pt follows with [facility],Elizabeth Aris Everts, NP   PTSD (post-traumatic stress disorder)    Follows with PMHNP, Corie Chiquito, LOV 08/23/21 in Epic.    Past Medical History, Surgical history, Social history, and Family history were reviewed and updated as appropriate.   Please see review of systems for further details on the patient's review from today.   Objective:   Physical Exam:  LMP 11/03/2021 (Exact Date)   Physical Exam  Lab Review:     Component Value Date/Time   NA 134 (L) 01/18/2021 2245   K 3.8 01/18/2021 2245   CL 100 01/18/2021 2245   CO2 26 01/18/2021 2245   GLUCOSE 121 (H) 01/18/2021 2245   BUN 19 01/18/2021 2245   CREATININE 1.03 (H) 01/18/2021 2245   CALCIUM 9.5 01/18/2021 2245   PROT 6.9 01/18/2021 2245   ALBUMIN 4.0 01/18/2021 2245   AST 23 01/18/2021 2245   ALT 22 01/18/2021 2245   ALKPHOS 62 01/18/2021 2245   BILITOT 0.4  01/18/2021 2245   GFRNONAA >60 01/18/2021 2245       Component Value Date/Time   WBC 7.4 12/05/2021 1016   RBC 4.09 12/05/2021 1016   HGB 13.3 12/05/2021 1016   HCT 39.9 12/05/2021 1016   PLT 264 12/05/2021 1016   MCV 97.6 12/05/2021 1016   MCH 32.5 12/05/2021 1016   MCHC 33.3 12/05/2021 1016   RDW 12.9 12/05/2021 1016   LYMPHSABS 2.1 01/18/2021 2245   MONOABS 1.0 01/18/2021 2245   EOSABS 0.0 01/18/2021 2245   BASOSABS 0.0 01/18/2021 2245    No results found for: "POCLITH", "LITHIUM"   No results found for: "PHENYTOIN", "PHENOBARB", "VALPROATE", "CBMZ"   .res Assessment: Plan:    There are no diagnoses linked to this encounter.   Please see After Visit  Summary for patient specific instructions.  Future Appointments  Date Time Provider Department Center  01/08/2023  9:00 AM Stevphen Meuse, [facility] None    No orders of the defined types were placed in this encounter.   -------------------------------

## 2023-01-08 ENCOUNTER — Ambulatory Visit (INDEPENDENT_AMBULATORY_CARE_PROVIDER_SITE_OTHER): Payer: 59 | Admitting: Psychiatry

## 2023-01-08 DIAGNOSIS — F4310 Post-traumatic stress disorder, unspecified: Secondary | ICD-10-CM

## 2023-01-08 NOTE — Progress Notes (Signed)
[facility] Counselor/Therapist Progress Note  Patient ID: [name], MRN: [id number],    Date: 01/08/2023  Time Spent: 54 minutes start time 9:02 AM end time 9:56 AM Virtual Visit via Video Note Connected with patient by a telemedicine/telehealth application, with their informed consent, and verified patient privacy and that I am speaking with the correct person using two identifiers. I discussed the limitations, risks, security and privacy concerns of performing psychotherapy and the availability of in person appointments. I also discussed with the patient that there may be a patient responsible charge related to this service. The patient expressed understanding and agreed to proceed. I discussed the treatment planning with the patient. The patient was provided an opportunity to ask questions and all were answered. The patient agreed with the plan and demonstrated an understanding of the instructions. The patient was advised to call  our office if  symptoms worsen or feel they are in a crisis state and need immediate contact.   Therapist Location: home Patient Location: home    Treatment Type: Individual Therapy  Reported Symptoms: nightmares, anxiety, triggered responses, nightmares  Mental Status Exam:  Appearance:   Well Groomed     Behavior:  Appropriate  Motor:  Normal  Speech/Language:   Normal Rate  Affect:  Appropriate  Mood:  normal  Thought process:  normal  Thought content:    WNL  Sensory/Perceptual disturbances:    WNL  Orientation:  oriented to person, place, time/date, and situation  Attention:  Good  Concentration:  Good  Memory:  WNL  Fund of knowledge:   Good  Insight:    Good  Judgment:   Good  Impulse Control:  Good   Risk Assessment: Danger to Self:  No Self-injurious Behavior: No Danger to Others: No Duty to Warn:no Physical Aggression / Violence:No  Access to Firearms a concern: No  Gang Involvement:No   Subjective: Met with patient  via virtual session. She shared that she was going to have to go on a trip with her husband's family.  Patient explained that she has not talked with her in laws for the past year and half after the difficult graduation party. She explained she has been very triggered and feeling high anxiety.  Had patient think through the different situations that lead to issues and develop plans to manage them appropriately.  Encouraged her to think through responses to statements by husband's mother and sister that trigger her and her children.  Patient was also encouraged to talk to her children about how to handle the critsm that is upsetting for them as well.Patient reported feeling better having a plan to deal with the family weekend.  She went on to share that her nightmares are better and overall she and her husband are doing better and she is managing any flashbacks that surface.  Interventions: Solution-Oriented/Positive Psychology and Insight-Oriented  Diagnosis:   ICD-10-CM   1. PTSD (post-traumatic stress disorder)  F43.10       Plan:  Patient is to use CBT and coping skills to continue decreasing triggered responses.  Patient is to follow plans to deal with the family weekend with her in laws.  Patient is to work on listening some brain spotting bilateral music and doing a 3-minute journaling exercise prior to bedtime to see if that helps decrease nightmares.  Patient is to work on self talk to remind herself that her she is enough.  Patient is to exercise to release negative emotions appropriately.  Patient  is to use acupuncture as a way to reduce stress.   Patient is to take medication as directed.  Patient is to continue working with providers on medical issues.     Stevphen Meuse, [facility]

## 2023-01-16 ENCOUNTER — Ambulatory Visit: Payer: 59 | Admitting: Psychiatry

## 2023-03-05 ENCOUNTER — Ambulatory Visit (INDEPENDENT_AMBULATORY_CARE_PROVIDER_SITE_OTHER): Payer: 59 | Admitting: Psychiatry

## 2023-03-05 DIAGNOSIS — F4310 Post-traumatic stress disorder, unspecified: Secondary | ICD-10-CM
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
## 2023-05-06 ENCOUNTER — Ambulatory Visit: Admitting: Psychiatry

## 2023-05-20 DIAGNOSIS — Z1389 Encounter for screening for other disorder: Secondary | ICD-10-CM | POA: Diagnosis not present

## 2023-05-20 DIAGNOSIS — Z90710 Acquired absence of both cervix and uterus: Secondary | ICD-10-CM | POA: Diagnosis not present

## 2023-05-20 DIAGNOSIS — R3129 Other microscopic hematuria: Secondary | ICD-10-CM | POA: Diagnosis not present

## 2023-05-20 DIAGNOSIS — Z01419 Encounter for gynecological examination (general) (routine) without abnormal findings: Secondary | ICD-10-CM | POA: Diagnosis not present

## 2023-05-20 DIAGNOSIS — Z130 Encounter for screening for diseases of the blood and blood-forming organs and certain disorders involving the immune mechanism: Secondary | ICD-10-CM | POA: Diagnosis not present

## 2023-05-20 DIAGNOSIS — Z1231 Encounter for screening mammogram for malignant neoplasm of breast: Secondary | ICD-10-CM | POA: Diagnosis not present

## 2023-06-05 DIAGNOSIS — F4310 Post-traumatic stress disorder, unspecified: Secondary | ICD-10-CM | POA: Diagnosis not present

## 2023-06-05 DIAGNOSIS — G4700 Insomnia, unspecified: Secondary | ICD-10-CM | POA: Diagnosis not present

## 2023-06-05 DIAGNOSIS — F411 Generalized anxiety disorder: Secondary | ICD-10-CM | POA: Diagnosis not present

## 2023-06-05 DIAGNOSIS — F3342 Major depressive disorder, recurrent, in full remission: Secondary | ICD-10-CM | POA: Diagnosis not present

## 2023-06-27 ENCOUNTER — Ambulatory Visit (INDEPENDENT_AMBULATORY_CARE_PROVIDER_SITE_OTHER): Admitting: Psychiatry

## 2023-06-27 DIAGNOSIS — F4310 Post-traumatic stress disorder, unspecified: Secondary | ICD-10-CM

## 2023-06-27 NOTE — Progress Notes (Signed)
 [facility] Counselor/Therapist Progress Note  Patient ID: [name], MRN: [id number],    Date: 06/27/2023  Time Spent: 50 minutes start time 9:10 AM end time 10 AM  Treatment Type: Individual Therapy  Reported Symptoms:  anxiety, triggered responses, rumination, fatigue  Mental Status Exam:  Appearance:   Well Groomed     Behavior:  Appropriate  Motor:  Normal  Speech/Language:   Normal Rate  Affect:  Appropriate  Mood:  anxious  Thought process:  normal  Thought content:    WNL  Sensory/Perceptual disturbances:    WNL  Orientation:  oriented to person, place, time/date, and situation  Attention:  Good  Concentration:  Good  Memory:  WNL  Fund of knowledge:   Good  Insight:    Good  Judgment:   Good  Impulse Control:  Good   Risk Assessment: Danger to Self:  No Self-injurious Behavior: No Danger to Others: No Duty to Warn:no Physical Aggression / Violence:No  Access to Firearms a concern: No  Gang Involvement:No   Subjective: patient was present for session.  She shared she is having another graduation party but it isn't going to be as stressful due to different dynamics. She shared that her daughter got a new horse and it is hard because he has to be fed twice a day due to having to go over there every night. Her daughter is being tested for possibly being on the spectrum.She shared that there are lots of pressure on her to manage things with her daughter who does not respond the way others.  Patient was encouraged to take things 1 step at a time and just to see what the testing may show without giving it any judgment.  She was also encouraged to think of ways that she could help her children set some appropriate limits and the way she can set appropriate limits with her in-laws when they come to the graduation party.  Patient was able to think of some things that they could say to redirect conversations or in conversations without feeling guilt and shame.  She  was also able to realize that she can set an appropriate limit and encourage them to communicate with her husband if they have more concerns or thoughts.  Patient reported that overall she was doing much better she felt that things that she was noticing were more appropriate and not due to her PTSD.  Agreed at this time patient's case would be put on hold due to progress and that she would contact office if she needed future sessions.   Interventions: Solution-Oriented/Positive Psychology and Insight-Oriented  Diagnosis:   ICD-10-CM   1. PTSD (post-traumatic stress disorder)  F43.10       Plan:  Patient is to use CBT and coping skills to continue decreasing triggered responses.  Patient is to set appropriate limits with her in-laws and encouraged her children to do the same.  Case will be put on hold at this time due to patient She will contact clinician's office if she needs another appointment.  Patient is to work on listening some brain spotting bilateral music and doing a 3-minute journaling exercise prior to bedtime to see if that helps decrease nightmares.  Patient is to work on self talk to remind herself that her she is enough.  Patient is to exercise to release negative emotions appropriately.  Patient is to use acupuncture as a way to reduce stress. Patient is to take medication as directed.  Patient  is to continue working with providers on medical issues.     Marlise Simpers, [facility]

## 2023-07-18 IMAGING — CT CT ANGIO HEAD-NECK (W OR W/O PERF)
1 of 11 series · 6 of 33 positions shown · IV contrast (Omnipaque)
Comparison: None available.

CLINICAL DATA: Initial evaluation for acute headache.

EXAM:
CT ANGIOGRAPHY HEAD AND NECK
TECHNIQUE: Multidetector CT imaging of the head and neck was performed using
the standard protocol during bolus administration of intravenous
contrast. Multiplanar CT image reconstructions and MIPs were
obtained to evaluate the vascular anatomy. Carotid stenosis
measurements (when applicable) are obtained utilizing NASCET
criteria, using the distal internal carotid diameter as the
denominator.
CONTRAST:  75mL OMNIPAQUE IOHEXOL 350 MG/ML SOLN

[Series 10: axial thin · axial · 0.40mm/px · z∈[+1229,+1445]mm · 6 of 315 slices shown]
[im 45/315  soft-tissue]
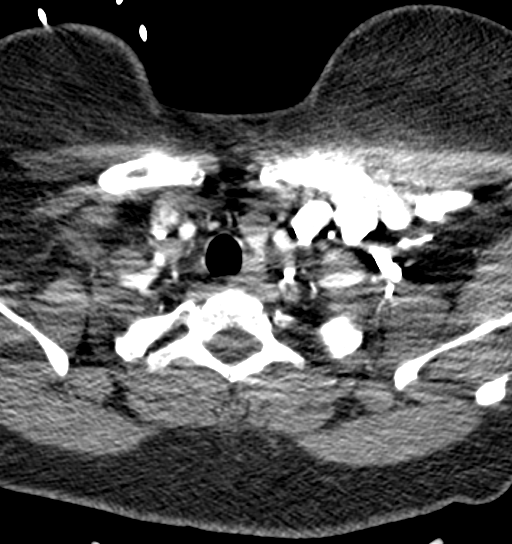
[im 90/315  bone]
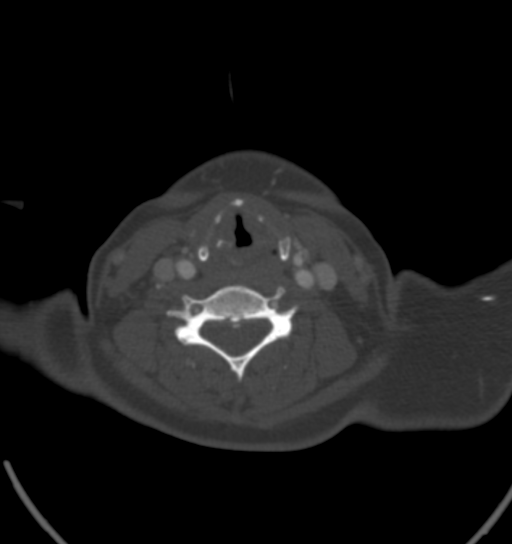
[im 135/315  soft-tissue]
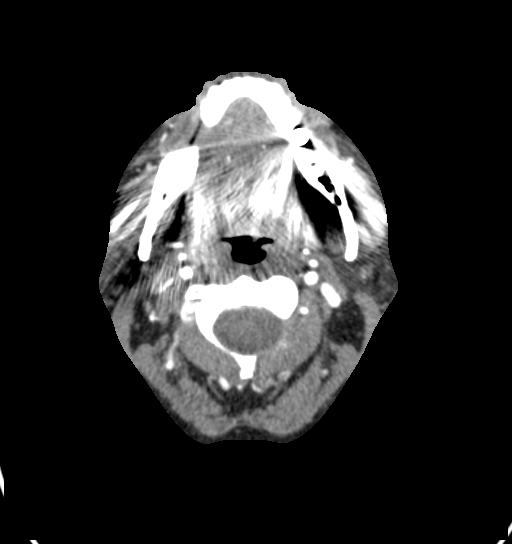
[im 180/315  bone]
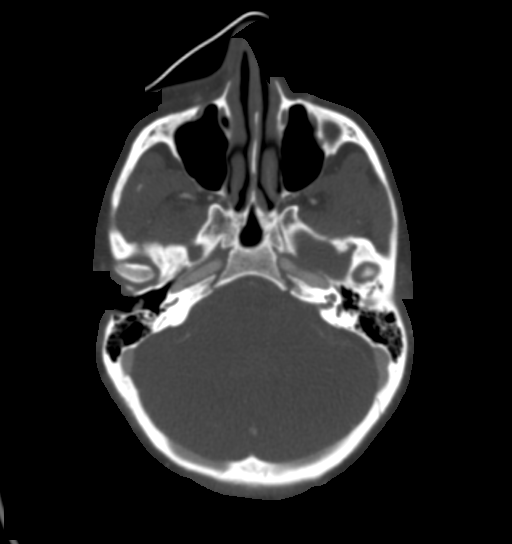
[im 225/315  soft-tissue]
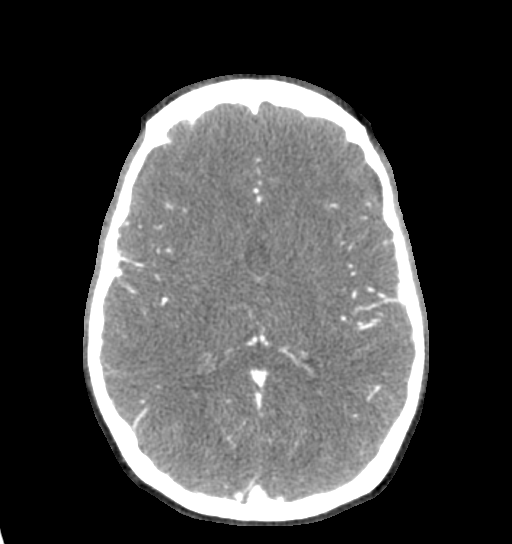
[im 270/315  bone]
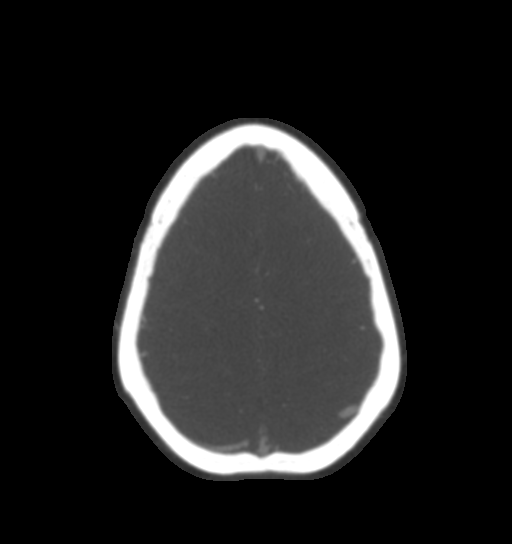

[6 of 33 positions shown; findings below may reference images not displayed]

FINDINGS: CT HEAD FINDINGS

Brain: Cerebral volume within normal limits for patient age.

No evidence for acute intracranial hemorrhage. No findings to
suggest acute large vessel territory infarct. No mass lesion,
midline shift, or mass effect. Ventricles are normal in size without
evidence for hydrocephalus. No extra-axial fluid collection
identified.

Vascular: No hyperdense vessel identified.

Skull: Scalp soft tissues demonstrate no acute abnormality.
Calvarium intact.

Sinuses/Orbits: Globes and orbital soft tissues within normal
limits.

Visualized paranasal sinuses are clear. No mastoid effusion.

CTA NECK FINDINGS

Aortic arch: Visualized aortic arch normal caliber with normal
branch pattern. No stenosis about the origin the great vessels.

Right carotid system: Right common and internal carotid arteries
widely patent without stenosis, dissection or occlusion.

Left carotid system: Left common and internal carotid arteries
widely patent without stenosis, dissection or occlusion.

Vertebral arteries: Both vertebral arteries arise from the
subclavian arteries. No proximal subclavian artery stenosis. Left
vertebral artery dominant. Vertebral arteries patent without
stenosis, dissection or occlusion.

Skeleton: No discrete or worrisome osseous lesions. Mild-to-moderate
multilevel cervical spondylosis without high-grade spinal stenosis.

Other neck: No other acute soft tissue abnormality within the neck.

Upper chest: Visualized upper chest demonstrates no acute finding.

Review of the MIP images confirms the above findings

CTA HEAD FINDINGS

Anterior circulation: Petrous, cavernous, and supraclinoid segments
of both internal carotid arteries are widely patent. Persistent
trigeminal artery present on the left. A1 segments widely patent.
Normal anterior communicating artery complex. Anterior cerebral
arteries patent without stenosis. No M1 stenosis or occlusion.
Normal MCA bifurcations. Distal MCA branches perfused and symmetric.

Posterior circulation: Left vertebral artery largely terminates in
PICA, although a tiny branch ascending towards the vertebrobasilar
junction. Right vertebral artery markedly hypoplastic. Left PICA
patent. Right PICA not seen. Proximal basilar artery is markedly
hypoplastic proximal to the persistent left trigeminal artery.
Basilar widely patent distally. Superior cerebellar arteries patent
bilaterally. Right PCA supplied via the basilar. Fetal type origin
left PCA. Both PCAs patent to their distal aspects without stenosis.

Venous sinuses: Patent.

Anatomic variants: Persistent left trigeminal artery with overall
diminutive vertebrobasilar system. Fetal type origin of the left
PCA. No intracranial aneurysm or other vascular abnormality.

Review of the MIP images confirms the above findings
IMPRESSION: 1. Normal CTA of the head and neck. No large vessel occlusion,
hemodynamically significant stenosis, or other acute vascular
abnormality. No aneurysm.
2. Persistent left trigeminal artery with secondary diminutive
vertebrobasilar system.
3. No other acute intracranial abnormality.

## 2023-08-07 ENCOUNTER — Ambulatory Visit: Admitting: Psychiatry

## 2023-08-19 ENCOUNTER — Ambulatory Visit: Admitting: Psychiatry

## 2023-08-19 DIAGNOSIS — F4310 Post-traumatic stress disorder, unspecified: Secondary | ICD-10-CM

## 2023-08-19 NOTE — Progress Notes (Signed)
 [facility] Counselor/Therapist Progress Note  Patient ID: [name], MRN: [id number],    Date: 08/19/2023  Time Spent: 51 minutes start time 11:01 AM end time 11:52 AM Virtual Visit via Video Note Connected with patient by a telemedicine/telehealth application, with their informed consent, and verified patient privacy and that I am speaking with the correct person using two identifiers. I discussed the limitations, risks, security and privacy concerns of performing psychotherapy and the availability of in person appointments. I also discussed with the patient that there may be a patient responsible charge related to this service. The patient expressed understanding and agreed to proceed. I discussed the treatment planning with the patient. The patient was provided an opportunity to ask questions and all were answered. The patient agreed with the plan and demonstrated an understanding of the instructions. The patient was advised to call  our office if  symptoms worsen or feel they are in a crisis state and need immediate contact.   Therapist Location: home Patient Location: home    Treatment Type: Individual Therapy  Reported Symptoms: anxiety, sadness, triggered responses, rumination  Mental Status Exam:  Appearance:   Well Groomed     Behavior:  Appropriate  Motor:  Normal  Speech/Language:   Normal Rate  Affect:  Appropriate  Mood:  anxious  Thought process:  normal  Thought content:    WNL  Sensory/Perceptual disturbances:    WNL  Orientation:  oriented to person, place, time/date, and situation  Attention:  Good  Concentration:  Good  Memory:  WNL  Fund of knowledge:   Good  Insight:    Good  Judgment:   Good  Impulse Control:  Good   Risk Assessment: Danger to Self:  No Self-injurious Behavior: No Danger to Others: No Duty to Warn:no Physical Aggression / Violence:No  Access to Firearms a concern: No  Gang Involvement:No   Subjective: Met with patient  via virtual session. She shared that at the beginning of July her father had emergency surgery and almost did not survive. She shared she couldn't use his arms and eat so they had to feel him. She went on to share her husband's mother got sick and they stopped by to see her. She went on to share that his mother told patient and the children that they were lazy and take advantage of husband. She went on to share that it was very triggering for her and getting back has been a lot. She went on to share it was a hard week with her parents. She explained it was hard to see the big guy that she was afraid of growing up couldn't even feed himself.  She went on to share things are hard with her dad the things hare mother in law is more triggering for her. Did processing set on dad not being able to move his arms SUDS level 7 Negative cognition I should do more, felt guilt sadness, frustration in shoulders and back. Patient was able to reduce SUDS level to 2 and see that her dad does appreciate her currently and sees her for her she is now and she doesn't have to take on husband's mom says.  Encouraged her to affirm herself regularly especially the younger parts of her.   Interventions: Eye Movement Desensitization and Reprocessing (EMDR) and Insight-Oriented  Diagnosis:   ICD-10-CM   1. PTSD (post-traumatic stress disorder)  F43.10       Plan: Patient is to use CBT and coping skills  to continue decreasing triggered responses.  Patient is to work on affirming the younger parts of her regarding her father being able to see and appreciate her currently.  Patient is to work on listening some brain spotting bilateral music and doing a 3-minute journaling exercise prior to bedtime to see if that helps decrease nightmares.  Patient is to work on self talk to remind herself that her she is enough.  Patient is to exercise to release negative emotions appropriately.  Patient is to use acupuncture as a way to reduce  stress. Patient is to take medication as directed.  Patient is to continue working with providers on medical issues.     Silvano Pacini, [facility]

## 2023-11-21 ENCOUNTER — Ambulatory Visit: Admitting: Psychiatry

## 2023-11-21 DIAGNOSIS — F4310 Post-traumatic stress disorder, unspecified: Secondary | ICD-10-CM
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
## 2023-12-03 DIAGNOSIS — F411 Generalized anxiety disorder: Secondary | ICD-10-CM | POA: Diagnosis not present

## 2023-12-03 DIAGNOSIS — F330 Major depressive disorder, recurrent, mild: Secondary | ICD-10-CM | POA: Diagnosis not present

## 2023-12-03 DIAGNOSIS — F4310 Post-traumatic stress disorder, unspecified: Secondary | ICD-10-CM | POA: Diagnosis not present

## 2023-12-03 DIAGNOSIS — G4700 Insomnia, unspecified: Secondary | ICD-10-CM | POA: Diagnosis not present

## 2023-12-10 ENCOUNTER — Ambulatory Visit: Admitting: Psychiatry

## 2023-12-10 DIAGNOSIS — F4310 Post-traumatic stress disorder, unspecified: Secondary | ICD-10-CM | POA: Diagnosis not present

## 2023-12-10 NOTE — Progress Notes (Signed)
 [facility] Counselor/Therapist Progress Note  Patient ID: [name], MRN: [id number],    Date: 12/10/2023  Time Spent: Minutes 50 start time 2:08 PM end time 2:58 PM Virtual Visit via Video Note Connected with patient by a telemedicine/telehealth application, with their informed consent, and verified patient privacy and that I am speaking with the correct person using two identifiers. I discussed the limitations, risks, security and privacy concerns of performing psychotherapy and the availability of in person appointments. I also discussed with the patient that there may be a patient responsible charge related to this service. The patient expressed understanding and agreed to proceed. I discussed the treatment planning with the patient. The patient was provided an opportunity to ask questions and all were answered. The patient agreed with the plan and demonstrated an understanding of the instructions. The patient was advised to call  our office if  symptoms worsen or feel they are in a crisis state and need immediate contact.   Therapist Location: home Patient Location: home    Treatment Type: Individual Therapy  Reported Symptoms: anxiety, triggered responses, sadness, unmotivated  Mental Status Exam:  Appearance:   Well Groomed     Behavior:  Appropriate  Motor:  Normal  Speech/Language:   Normal Rate  Affect:  Appropriate  Mood:  anxious  Thought process:  normal  Thought content:    WNL  Sensory/Perceptual disturbances:    WNL  Orientation:  oriented to person, place, time/date, and situation  Attention:  Good  Concentration:  Good  Memory:  WNL  Fund of knowledge:   Good  Insight:    Good  Judgment:   Good  Impulse Control:  Good   Risk Assessment: Danger to Self:  No Self-injurious Behavior: No Danger to Others: No Duty to Warn:no Physical Aggression / Violence:No  Access to Firearms a concern: No  Gang Involvement:No   Subjective: Met with patient  via virtual session. She shared that the situation with the horses is better but still an issue. She shared she is realizing that she may have to work on another situation soon.  She shared she got some bad news about her dad. She shared he has lots of hernias and there is 1 near the place of his last surgery and she is not sure if he can get through another surgery. Her daughter was tested for Autism and ADHD. She is on the lower end of the spectrum and has a learning disability. Discussed some options for her regarding helping her daughter.  Also encouraged patient to figure out if she needs to go and see her father since she is very concerned about his health and she does not want to live life with any regrets.  Patient was encouraged to talk with her husband about what she needs.  Patient was reminded of coping skills that she needs to use to keep herself at a good place and making good decisions.  Interventions: Solution-Oriented/Positive Psychology  Diagnosis:   ICD-10-CM   1. PTSD (post-traumatic stress disorder)  F43.10       Plan: Patient is to use CBT and coping skills to continue decreasing triggered responses.  Patient is to work on listening some brain spotting bilateral music and doing a 3-minute journaling exercise prior to bedtime to see if that helps decrease nightmares.  Patient is to work on self talk to remind herself that her she is enough.  Patient is to exercise to release negative emotions appropriately.  Patient  is to use acupuncture as a way to reduce stress. Patient is to take medication as directed.  Patient is to continue working with providers on medical issues.    Silvano Pacini, [facility]

## 2024-01-06 DIAGNOSIS — F411 Generalized anxiety disorder: Secondary | ICD-10-CM | POA: Diagnosis not present

## 2024-01-06 DIAGNOSIS — F3342 Major depressive disorder, recurrent, in full remission: Secondary | ICD-10-CM | POA: Diagnosis not present

## 2024-01-06 DIAGNOSIS — F4310 Post-traumatic stress disorder, unspecified: Secondary | ICD-10-CM | POA: Diagnosis not present

## 2024-01-06 DIAGNOSIS — G4700 Insomnia, unspecified: Secondary | ICD-10-CM | POA: Diagnosis not present

## 2024-02-25 ENCOUNTER — Ambulatory Visit (INDEPENDENT_AMBULATORY_CARE_PROVIDER_SITE_OTHER): Payer: Self-pay | Admitting: Psychiatry

## 2024-02-25 DIAGNOSIS — F4310 Post-traumatic stress disorder, unspecified: Secondary | ICD-10-CM
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
